# Patient Record
Sex: Male | Born: 1935 | Race: White | Hispanic: No | Marital: Married | Smoking: Former smoker
Health system: Southern US, Community
[De-identification: ages and names within clinical notes are randomized; demographics above are authoritative.]

## PROBLEM LIST (undated history)

## (undated) DIAGNOSIS — M549 Dorsalgia, unspecified: Secondary | ICD-10-CM

## (undated) DIAGNOSIS — E119 Type 2 diabetes mellitus without complications: Secondary | ICD-10-CM

## (undated) DIAGNOSIS — N184 Chronic kidney disease, stage 4 (severe): Secondary | ICD-10-CM

## (undated) DIAGNOSIS — E785 Hyperlipidemia, unspecified: Secondary | ICD-10-CM

## (undated) DIAGNOSIS — K219 Gastro-esophageal reflux disease without esophagitis: Secondary | ICD-10-CM

## (undated) DIAGNOSIS — Z794 Long term (current) use of insulin: Secondary | ICD-10-CM

## (undated) DIAGNOSIS — I2510 Atherosclerotic heart disease of native coronary artery without angina pectoris: Secondary | ICD-10-CM

## (undated) DIAGNOSIS — Z87891 Personal history of nicotine dependence: Secondary | ICD-10-CM

## (undated) DIAGNOSIS — H9190 Unspecified hearing loss, unspecified ear: Secondary | ICD-10-CM

## (undated) DIAGNOSIS — I77810 Thoracic aortic ectasia: Secondary | ICD-10-CM

## (undated) DIAGNOSIS — I10 Essential (primary) hypertension: Secondary | ICD-10-CM

## (undated) HISTORY — DX: Thoracic aortic ectasia: I77.810

## (undated) HISTORY — DX: Unspecified hearing loss, unspecified ear: H91.90

## (undated) HISTORY — DX: Morbid (severe) obesity due to excess calories: E66.01

## (undated) HISTORY — PX: COLONOSCOPY W/ POLYPECTOMY: SHX1380

## (undated) HISTORY — DX: Dorsalgia, unspecified: M54.9

## (undated) HISTORY — PX: ANGIOPLASTY: SHX39

## (undated) HISTORY — DX: Gastro-esophageal reflux disease without esophagitis: K21.9

## (undated) HISTORY — PX: LUMBAR DISC SURGERY: SHX700

## (undated) HISTORY — DX: Personal history of nicotine dependence: Z87.891

## (undated) HISTORY — DX: Hyperlipidemia, unspecified: E78.5

## (undated) HISTORY — PX: SKIN CANCER EXCISION: SHX779

## (undated) HISTORY — PX: CARDIAC SURGERY: SHX584

## (undated) HISTORY — DX: Type 2 diabetes mellitus without complications: E11.9

## (undated) HISTORY — DX: Long term (current) use of insulin: Z79.4

## (undated) HISTORY — PX: APPENDECTOMY: SHX54

## (undated) HISTORY — DX: Essential (primary) hypertension: I10

---

## 1997-10-08 ENCOUNTER — Inpatient Hospital Stay (HOSPITAL_COMMUNITY): Admission: EM | Admit: 1997-10-08 | Discharge: 1997-10-10 | Payer: Self-pay | Admitting: Emergency Medicine

## 1998-04-24 ENCOUNTER — Emergency Department (HOSPITAL_COMMUNITY): Admission: EM | Admit: 1998-04-24 | Discharge: 1998-04-24 | Payer: Self-pay | Admitting: Emergency Medicine

## 1998-09-22 ENCOUNTER — Emergency Department (HOSPITAL_COMMUNITY): Admission: EM | Admit: 1998-09-22 | Discharge: 1998-09-22 | Payer: Self-pay | Admitting: Emergency Medicine

## 1998-10-24 ENCOUNTER — Encounter: Admission: RE | Admit: 1998-10-24 | Discharge: 1998-12-23 | Payer: Self-pay | Admitting: *Deleted

## 1998-11-06 ENCOUNTER — Encounter: Admission: RE | Admit: 1998-11-06 | Discharge: 1998-12-23 | Payer: Self-pay | Admitting: Internal Medicine

## 1998-12-08 ENCOUNTER — Emergency Department (HOSPITAL_COMMUNITY): Admission: EM | Admit: 1998-12-08 | Discharge: 1998-12-08 | Payer: Self-pay | Admitting: Emergency Medicine

## 1998-12-08 ENCOUNTER — Encounter: Payer: Self-pay | Admitting: Emergency Medicine

## 1998-12-24 ENCOUNTER — Encounter: Admission: RE | Admit: 1998-12-24 | Discharge: 1999-03-24 | Payer: Self-pay | Admitting: Internal Medicine

## 1999-01-24 ENCOUNTER — Encounter: Payer: Self-pay | Admitting: Cardiology

## 1999-01-24 ENCOUNTER — Ambulatory Visit (HOSPITAL_COMMUNITY): Admission: RE | Admit: 1999-01-24 | Discharge: 1999-01-24 | Payer: Self-pay | Admitting: Cardiology

## 1999-03-25 ENCOUNTER — Encounter: Admission: RE | Admit: 1999-03-25 | Discharge: 1999-06-23 | Payer: Self-pay | Admitting: Internal Medicine

## 2000-10-11 ENCOUNTER — Inpatient Hospital Stay (HOSPITAL_COMMUNITY): Admission: EM | Admit: 2000-10-11 | Discharge: 2000-10-12 | Payer: Self-pay | Admitting: Emergency Medicine

## 2000-10-11 ENCOUNTER — Encounter: Payer: Self-pay | Admitting: Cardiology

## 2001-05-25 DIAGNOSIS — I2510 Atherosclerotic heart disease of native coronary artery without angina pectoris: Secondary | ICD-10-CM

## 2001-05-25 HISTORY — DX: Atherosclerotic heart disease of native coronary artery without angina pectoris: I25.10

## 2001-06-24 ENCOUNTER — Encounter (INDEPENDENT_AMBULATORY_CARE_PROVIDER_SITE_OTHER): Payer: Self-pay | Admitting: Specialist

## 2001-06-24 ENCOUNTER — Ambulatory Visit (HOSPITAL_COMMUNITY): Admission: RE | Admit: 2001-06-24 | Discharge: 2001-06-24 | Payer: Self-pay | Admitting: Gastroenterology

## 2001-08-18 ENCOUNTER — Encounter: Admission: RE | Admit: 2001-08-18 | Discharge: 2001-08-18 | Payer: Self-pay | Admitting: Orthopedic Surgery

## 2001-08-18 ENCOUNTER — Encounter: Payer: Self-pay | Admitting: Orthopedic Surgery

## 2001-10-07 ENCOUNTER — Encounter: Payer: Self-pay | Admitting: Orthopedic Surgery

## 2001-10-07 ENCOUNTER — Ambulatory Visit (HOSPITAL_COMMUNITY): Admission: RE | Admit: 2001-10-07 | Discharge: 2001-10-07 | Payer: Self-pay | Admitting: Orthopedic Surgery

## 2001-10-18 ENCOUNTER — Encounter: Payer: Self-pay | Admitting: Cardiology

## 2001-10-18 ENCOUNTER — Inpatient Hospital Stay (HOSPITAL_COMMUNITY): Admission: RE | Admit: 2001-10-18 | Discharge: 2001-10-20 | Payer: Self-pay | Admitting: Cardiology

## 2001-10-23 HISTORY — PX: CORONARY ARTERY BYPASS GRAFT: SHX141

## 2001-10-28 ENCOUNTER — Encounter: Payer: Self-pay | Admitting: Surgery

## 2001-10-28 ENCOUNTER — Inpatient Hospital Stay (HOSPITAL_COMMUNITY): Admission: RE | Admit: 2001-10-28 | Discharge: 2001-11-02 | Payer: Self-pay | Admitting: Surgery

## 2001-10-29 ENCOUNTER — Encounter: Payer: Self-pay | Admitting: Surgery

## 2001-10-30 ENCOUNTER — Encounter: Payer: Self-pay | Admitting: Cardiothoracic Surgery

## 2001-10-31 ENCOUNTER — Encounter: Payer: Self-pay | Admitting: Surgery

## 2002-04-27 ENCOUNTER — Inpatient Hospital Stay (HOSPITAL_COMMUNITY): Admission: RE | Admit: 2002-04-27 | Discharge: 2002-04-30 | Payer: Self-pay | Admitting: Orthopedic Surgery

## 2002-04-27 ENCOUNTER — Encounter: Payer: Self-pay | Admitting: Orthopedic Surgery

## 2002-10-10 ENCOUNTER — Encounter: Payer: Self-pay | Admitting: Emergency Medicine

## 2002-10-10 ENCOUNTER — Emergency Department (HOSPITAL_COMMUNITY): Admission: EM | Admit: 2002-10-10 | Discharge: 2002-10-10 | Payer: Self-pay | Admitting: Emergency Medicine

## 2002-11-08 ENCOUNTER — Encounter: Payer: Self-pay | Admitting: Family Medicine

## 2002-11-08 ENCOUNTER — Ambulatory Visit (HOSPITAL_COMMUNITY): Admission: RE | Admit: 2002-11-08 | Discharge: 2002-11-08 | Payer: Self-pay | Admitting: Allergy

## 2003-10-15 ENCOUNTER — Encounter: Admission: RE | Admit: 2003-10-15 | Discharge: 2003-10-15 | Payer: Self-pay | Admitting: Orthopedic Surgery

## 2003-11-25 ENCOUNTER — Emergency Department (HOSPITAL_COMMUNITY): Admission: EM | Admit: 2003-11-25 | Discharge: 2003-11-25 | Payer: Self-pay | Admitting: Emergency Medicine

## 2004-03-02 ENCOUNTER — Emergency Department (HOSPITAL_COMMUNITY): Admission: EM | Admit: 2004-03-02 | Discharge: 2004-03-02 | Payer: Self-pay | Admitting: Emergency Medicine

## 2004-04-16 ENCOUNTER — Observation Stay (HOSPITAL_COMMUNITY): Admission: RE | Admit: 2004-04-16 | Discharge: 2004-04-18 | Payer: Self-pay | Admitting: Orthopedic Surgery

## 2004-08-12 ENCOUNTER — Ambulatory Visit (HOSPITAL_COMMUNITY): Admission: RE | Admit: 2004-08-12 | Discharge: 2004-08-12 | Payer: Self-pay | Admitting: Orthopedic Surgery

## 2004-10-09 ENCOUNTER — Ambulatory Visit: Payer: Self-pay | Admitting: Cardiology

## 2005-07-01 ENCOUNTER — Ambulatory Visit: Payer: Self-pay | Admitting: Cardiology

## 2006-01-27 ENCOUNTER — Ambulatory Visit: Payer: Self-pay | Admitting: Cardiology

## 2007-02-03 ENCOUNTER — Ambulatory Visit: Payer: Self-pay | Admitting: Cardiology

## 2007-02-15 ENCOUNTER — Ambulatory Visit: Payer: Self-pay

## 2007-03-07 ENCOUNTER — Encounter: Admission: RE | Admit: 2007-03-07 | Discharge: 2007-03-07 | Payer: Self-pay | Admitting: Orthopedic Surgery

## 2007-08-18 ENCOUNTER — Encounter: Admission: RE | Admit: 2007-08-18 | Discharge: 2007-08-18 | Payer: Self-pay | Admitting: Family Medicine

## 2008-03-14 ENCOUNTER — Ambulatory Visit: Payer: Self-pay | Admitting: Cardiology

## 2008-07-27 ENCOUNTER — Inpatient Hospital Stay (HOSPITAL_COMMUNITY): Admission: EM | Admit: 2008-07-27 | Discharge: 2008-07-29 | Payer: Self-pay | Admitting: Emergency Medicine

## 2008-07-27 ENCOUNTER — Ambulatory Visit: Payer: Self-pay | Admitting: Family Medicine

## 2008-10-08 DIAGNOSIS — I10 Essential (primary) hypertension: Secondary | ICD-10-CM | POA: Insufficient documentation

## 2008-10-08 DIAGNOSIS — H9190 Unspecified hearing loss, unspecified ear: Secondary | ICD-10-CM | POA: Insufficient documentation

## 2008-10-08 DIAGNOSIS — E785 Hyperlipidemia, unspecified: Secondary | ICD-10-CM

## 2008-10-08 DIAGNOSIS — E119 Type 2 diabetes mellitus without complications: Secondary | ICD-10-CM

## 2008-10-08 DIAGNOSIS — K219 Gastro-esophageal reflux disease without esophagitis: Secondary | ICD-10-CM | POA: Insufficient documentation

## 2008-10-08 DIAGNOSIS — M549 Dorsalgia, unspecified: Secondary | ICD-10-CM | POA: Insufficient documentation

## 2008-10-08 DIAGNOSIS — I2510 Atherosclerotic heart disease of native coronary artery without angina pectoris: Secondary | ICD-10-CM | POA: Insufficient documentation

## 2008-10-10 ENCOUNTER — Ambulatory Visit: Payer: Self-pay | Admitting: Cardiology

## 2009-02-05 ENCOUNTER — Encounter: Admission: RE | Admit: 2009-02-05 | Discharge: 2009-02-05 | Payer: Self-pay | Admitting: Family Medicine

## 2009-04-03 ENCOUNTER — Telehealth: Payer: Self-pay | Admitting: Cardiology

## 2009-10-23 ENCOUNTER — Ambulatory Visit: Payer: Self-pay | Admitting: Cardiology

## 2010-01-10 ENCOUNTER — Encounter: Admission: RE | Admit: 2010-01-10 | Discharge: 2010-01-10 | Payer: Self-pay | Admitting: Internal Medicine

## 2010-02-25 ENCOUNTER — Ambulatory Visit (HOSPITAL_COMMUNITY): Admission: RE | Admit: 2010-02-25 | Discharge: 2010-02-25 | Payer: Self-pay | Admitting: Orthopaedic Surgery

## 2010-03-04 ENCOUNTER — Ambulatory Visit (HOSPITAL_COMMUNITY): Admission: RE | Admit: 2010-03-04 | Discharge: 2010-03-04 | Payer: Self-pay | Admitting: Orthopaedic Surgery

## 2010-03-07 ENCOUNTER — Encounter
Admission: RE | Admit: 2010-03-07 | Discharge: 2010-05-22 | Payer: Self-pay | Source: Home / Self Care | Attending: Orthopaedic Surgery | Admitting: Orthopaedic Surgery

## 2010-06-15 ENCOUNTER — Encounter: Payer: Self-pay | Admitting: Physical Medicine and Rehabilitation

## 2010-06-26 NOTE — Assessment & Plan Note (Signed)
Summary: [facility]   Visit Type:  Follow-up Primary Provider:  Dr. Rosezetta Schlatter  CC:  CAD.  History of Present Illness: The patient returns for yearly followup. Since I last saw him he has had no apparent cardiac problems though he and his wife report that he has been hospitalized 3 times her dehydration, renal insufficiency and nephrolithiasis. I have none of these records. He does not report any cardiac workup since I last saw him. He does get dyspneic with moderate exertion but this seems to be baseline. Is not describing PND or orthopnea. He is not describing chest pressure, neck or arm discomfort. He has no palpitations, presyncope or syncope. He has a low functional level because of joint pains.  Current Medications (verified): 1)  Actos 45 Mg Tabs (Pioglitazone Hcl) .Marland Kitchen.. 1 Tab By Mouth Once Daily 2)  Aspirin 81 Mg  Tabs (Aspirin) .Marland Kitchen.. 1 By Mouth Daily 3)  Alprazolam 0.5 Mg Tabs (Alprazolam) .Marland Kitchen.. 1 By Mouth As Needed 4)  Tamsulosin Hcl 0.4 Mg Caps (Tamsulosin Hcl) .Marland Kitchen.. 1 By Mouth Daily 5)  Lipitor 40 Mg Tabs (Atorvastatin Calcium) .Marland Kitchen.. 1 By Mouth Daily 6)  Hydrocodone-Acetaminophen 7.5-650 Mg Tabs (Hydrocodone-Acetaminophen) .... As Needed 7)  Lisinopril 10 Mg Tabs (Lisinopril) .Marland Kitchen.. 1 By Mouth Daily 8)  Omeprazole 20 Mg Cpdr (Omeprazole) .Marland Kitchen.. 1 By Mouth Dliay  Allergies (verified): No Known Drug Allergies  Past History:  Past Medical History: Reviewed history from 10/08/2008 and no changes required.  1. Hypertension.   2. Hyperlipidemia.   3. Diabetes type 2, insulin dependent.   4. Coronary artery disease, status post CABG.   5. Morbid obesity.   6. GERD.   7. Remote tobacco history.   8. Back pain.   9. Hard of hearing.   Past Surgical History:  1. Appendectomy.   2. Lumbar disk surgery x2.   3. CABG x5 vessels. (LAD diffuse luminal irregularities with focal proximal 70% stenosis, long mid 60% stenosis. The circumflex had a large intermediate proximal and mid  50% stenosis. The right coronary artery had a long 70% stenosis before in RV branch and 99% stenosis before on acute marginal with TIMI I flow.  The EF was 65%.  He had CABG by Dr. Laneta Simmers on October 28, 2001 with a LIMA to the LAD, SVG to diagonal branch, SVG to sequential obtuse marginal and distal circumflex and SVG to the right coronary artery.)  4. Angioplasty   5. Colon polypectomy.   Review of Systems       As stated in the HPI and negative for all other systems.   Vital Signs:  Patient profile:   77 year old male Height:      72 inches Weight:      260 pounds BMI:     35.39 Pulse rate:   66 / minute Resp:     16 per minute BP sitting:   142 / 80  (right arm)  Vitals Entered By: Marrion Coy, CNA (October 23, 2009 9:29 AM)  Physical Exam  General:  Well developed, well nourished, in no acute distress. Head:  normocephalic and atraumatic Mouth:  Poor dentition.  Oral mucosa normal. Neck:  Neck supple, no JVD. No masses, thyromegaly or abnormal cervical nodes. Chest Wall:  Well-healed sternotomy scar Lungs:  Clear bilaterally to auscultation and percussion. Abdomen:  Bowel sounds positive; abdomen soft and non-tender without masses, organomegaly, or hernias noted. No hepatosplenomegaly, obese Msk:  Back normal, normal gait. Muscle strength and tone normal. Extremities:  mild bilateral lower extremity edema Neurologic:  Alert and oriented x 3. Skin:  Intact without lesions or rashes. Psych:  Normal affect.   Detailed Cardiovascular Exam  Neck    Carotids: Carotids full and equal bilaterally without bruits.      Neck Veins: Normal, no JVD.    Heart    Inspection: no deformities or lifts noted.      Palpation: normal PMI with no thrills palpable.      Auscultation: regular rate and rhythm, S1, S2 without murmurs, rubs, gallops, or clicks.    Vascular    Abdominal Aorta: no palpable masses, pulsations, or audible bruits.      Femoral Pulses: normal femoral pulses  bilaterally.      Pedal Pulses: normal pedal pulses bilaterally.      Radial Pulses: normal radial pulses bilaterally.      Peripheral Circulation: no clubbing, cyanosis, or edema noted with normal capillary refill.     EKG  Procedure date:  10/23/2009  Findings:      Sinus rhythm, rate 66, axis within normal limits, intervals within normal limits, no acute ST-T wave changes  Impression & Recommendations:  Problem # 1:  CAD (ICD-414.00) This point he has had no acute cardiac complaints. No further testing is indicated. We will continue with risk reduction. Orders: EKG w/ Interpretation (93000)  Problem # 2:  HYPERLIPIDEMIA (ICD-272.4) I will check with his primary care office andHospital records to see if he's had a recent fasting lipid profile. If not I were one. The goal should be an LDL less than 70 and HDL greater than 40.  Problem # 3:  HYPERTENSION (ICD-401.9) His blood pressure is slightly elevated. At this point this can be followed with medications as necessary. Apparently medications have been changed over the past year with his acute illnesses and I will need to review these records in order to understand how to treat him going forward. He will sign a release of information. Orders: EKG w/ Interpretation (93000)  Patient Instructions: 1)  Your physician recommends that you schedule a follow-up appointment in: 1 year 2)  Your physician recommends that you have a FASTING lipid profile:  3)  Your physician recommends that you continue on your current medications as directed. Please refer to the Current Medication list given to you today.

## 2010-08-06 LAB — COMPREHENSIVE METABOLIC PANEL
ALT: 14 U/L (ref 0–53)
Alkaline Phosphatase: 66 U/L (ref 39–117)
CO2: 24 mEq/L (ref 19–32)
GFR calc non Af Amer: 38 mL/min — ABNORMAL LOW (ref >60)
Glucose, Bld: 108 mg/dL — ABNORMAL HIGH (ref 70–99)
Potassium: 4.3 mEq/L (ref 3.5–5.1)
Sodium: 140 mEq/L (ref 135–145)

## 2010-08-06 LAB — URINALYSIS, ROUTINE W REFLEX MICROSCOPIC
Bilirubin Urine: NEGATIVE
Hgb urine dipstick: NEGATIVE
Ketones, ur: NEGATIVE mg/dL
Nitrite: NEGATIVE
pH: 5.5 (ref 5.0–8.0)

## 2010-08-06 LAB — DIFFERENTIAL
Basophils Relative: 1 % (ref 0–1)
Eosinophils Absolute: 0.1 10*3/uL (ref 0.0–0.7)
Monocytes Relative: 11 % (ref 3–12)
Neutrophils Relative %: 53 % (ref 43–77)

## 2010-08-06 LAB — CBC
HCT: 31.7 % — ABNORMAL LOW (ref 39.0–52.0)
Hemoglobin: 10.7 g/dL — ABNORMAL LOW (ref 13.0–17.0)
WBC: 6.4 10*3/uL (ref 4.0–10.5)

## 2010-08-06 LAB — GLUCOSE, CAPILLARY: Glucose-Capillary: 135 mg/dL — ABNORMAL HIGH (ref 70–99)

## 2010-08-06 LAB — URINE MICROSCOPIC-ADD ON

## 2010-08-06 LAB — SURGICAL PCR SCREEN: MRSA, PCR: NEGATIVE

## 2010-09-04 LAB — POCT CARDIAC MARKERS
CKMB, poc: 1.3 ng/mL (ref 1.0–8.0)
Myoglobin, poc: 239 ng/mL (ref 12–200)

## 2010-09-04 LAB — URINALYSIS, ROUTINE W REFLEX MICROSCOPIC
Protein, ur: NEGATIVE mg/dL
Specific Gravity, Urine: 1.024 (ref 1.005–1.030)
Urobilinogen, UA: 1 mg/dL (ref 0.0–1.0)

## 2010-09-04 LAB — BASIC METABOLIC PANEL
CO2: 23 mEq/L (ref 19–32)
Calcium: 9.1 mg/dL (ref 8.4–10.5)
Calcium: 9.1 mg/dL (ref 8.4–10.5)
Creatinine, Ser: 2.58 mg/dL — ABNORMAL HIGH (ref 0.4–1.5)
GFR calc Af Amer: >60 mL/min (ref >60)
GFR calc non Af Amer: 52 mL/min — ABNORMAL LOW (ref >60)
Glucose, Bld: 142 mg/dL — ABNORMAL HIGH (ref 70–99)
Potassium: 5.2 mEq/L — ABNORMAL HIGH (ref 3.5–5.1)
Sodium: 141 mEq/L (ref 135–145)
Sodium: 141 mEq/L (ref 135–145)

## 2010-09-04 LAB — CBC
HCT: 34.6 % — ABNORMAL LOW (ref 39.0–52.0)
Hemoglobin: 13.3 g/dL (ref 13.0–17.0)
MCHC: 33.6 g/dL (ref 30.0–36.0)
MCHC: 34.2 g/dL (ref 30.0–36.0)
MCV: 90.4 fL (ref 78.0–100.0)
MCV: 90.9 fL (ref 78.0–100.0)
MCV: 91 fL (ref 78.0–100.0)
Platelets: 153 10*3/uL (ref 150–400)
RBC: 3.81 MIL/uL — ABNORMAL LOW (ref 4.22–5.81)
RBC: 3.97 MIL/uL — ABNORMAL LOW (ref 4.22–5.81)
RBC: 4.3 MIL/uL (ref 4.22–5.81)
RDW: 13.9 % (ref 11.5–15.5)
WBC: 5.9 10*3/uL (ref 4.0–10.5)
WBC: 6.7 10*3/uL (ref 4.0–10.5)

## 2010-09-04 LAB — DIFFERENTIAL
Basophils Relative: 1 % (ref 0–1)
Eosinophils Absolute: 0.3 10*3/uL (ref 0.0–0.7)
Lymphs Abs: 2 10*3/uL (ref 0.7–4.0)
Monocytes Absolute: 0.8 10*3/uL (ref 0.1–1.0)
Monocytes Relative: 12 % (ref 3–12)
Neutro Abs: 3.6 10*3/uL (ref 1.7–7.7)
Neutrophils Relative %: 54 % (ref 43–77)

## 2010-09-04 LAB — CREATININE, URINE, RANDOM: Creatinine, Urine: 328.7 mg/dL

## 2010-09-04 LAB — RENAL FUNCTION PANEL
BUN: 21 mg/dL (ref 6–23)
CO2: 21 mEq/L (ref 19–32)
CO2: 23 mEq/L (ref 19–32)
CO2: 24 mEq/L (ref 19–32)
Calcium: 8.9 mg/dL (ref 8.4–10.5)
Chloride: 109 mEq/L (ref 96–112)
Chloride: 115 mEq/L — ABNORMAL HIGH (ref 96–112)
Creatinine, Ser: 1.36 mg/dL (ref 0.4–1.5)
Creatinine, Ser: 1.67 mg/dL — ABNORMAL HIGH (ref 0.4–1.5)
GFR calc Af Amer: 35 mL/min — ABNORMAL LOW (ref >60)
GFR calc Af Amer: 49 mL/min — ABNORMAL LOW (ref >60)
GFR calc non Af Amer: 29 mL/min — ABNORMAL LOW (ref >60)
GFR calc non Af Amer: 41 mL/min — ABNORMAL LOW (ref >60)
Glucose, Bld: 122 mg/dL — ABNORMAL HIGH (ref 70–99)
Phosphorus: 3.2 mg/dL (ref 2.3–4.6)
Potassium: 5.7 mEq/L — ABNORMAL HIGH (ref 3.5–5.1)
Sodium: 138 mEq/L (ref 135–145)

## 2010-09-04 LAB — GLUCOSE, CAPILLARY
Glucose-Capillary: 149 mg/dL — ABNORMAL HIGH (ref 70–99)
Glucose-Capillary: 153 mg/dL — ABNORMAL HIGH (ref 70–99)
Glucose-Capillary: 99 mg/dL (ref 70–99)

## 2010-09-04 LAB — UREA NITROGEN, URINE: Urea Nitrogen, Ur: 526 mg/dL

## 2010-09-04 LAB — URIC ACID: Uric Acid, Serum: 9.7 mg/dL — ABNORMAL HIGH (ref 4.0–7.8)

## 2010-10-07 NOTE — Assessment & Plan Note (Signed)
[facility]                            CARDIOLOGY OFFICE NOTE   ZYMARION, [name]                      MRN:          [id number]  DATE:10/10/2008                            DOB:          06/15/1933    PRIMARY CARE PHYSICIAN:  Marjory Lies, MD   REASON FOR PRESENTATION:  Evaluate the patient with chest pain and  coronary artery disease.   HISTORY OF PRESENT ILLNESS:  The patient is 77 years old.  He returns  for 77-month followup.  Since I last saw him, he was hospitalized with  apparent dehydration and acute renal insufficiency.  He is now back on  his previous medications and having his medications and kidney function  followed by Dr. Doristine Counter.  The patient reports that he has no new  cardiovascular symptoms.  He is not particularly active though he was  able to plant a garden yesterday with his wife.  Apparently, he would  poke a hole in the ground and they would insert the plant.  He did not  have to do any howling.  He does little activities like this around his  house.  He is limited by some knee pain.  He will get short of breath  with any moderate activity, but he is not describing any progression of  this.  He has had no new PND or orthopnea.  He has had no new chest  discomfort, neck, or arm discomfort.  He has not had any  lightheadedness, brief syncope, or syncope.   PAST MEDICAL HISTORY:  Coronary artery disease (LAD diffuse luminal  irregularities with focal proximal 70% stenosis, long mid 60% stenosis.  The circumflex had a large intermediate proximal and mid 50% stenosis.  The right coronary artery had a long 70% stenosis before in RV branch  and 99% stenosis before on acute marginal with TIMI I flow.  The EF was  65%.  He had CABG by Dr. Laneta Simmers on October 28, 2001 with a LIMA to the LAD,  SVG to diagonal branch, SVG to sequential obtuse marginal and distal  circumflex and SVG to the right coronary artery.  His last stress  perfusion  study demonstrated no ischemia in 2008), dyslipidemia,  hypertension, morbid obesity, diabetes mellitus, appendectomy, lumbar  disk surgery, chronic renal insufficiency.   ALLERGIES:  None.   MEDICATIONS:  1. Actos 45 mg daily.  2. Celebrex 200 mg daily.  3. Lisinopril and hydrochlorothiazide.  4. Omeprazole 20 mg daily.  5. Alprazolam 0.5 mg b.i.d.  6. Metformin 500 mg b.i.d.  7. Levemir.  8. Flomax 0.4 mg daily.  9. Lipitor 40 mg daily.  10.Aspirin 81 mg daily.   REVIEW OF SYSTEMS:  As stated in the HPI and otherwise negative for  other systems.   PHYSICAL EXAMINATION:  GENERAL:  The patient is pleasant and in no  distress.  VITAL SIGNS:  Blood pressure 148/78, heart rate 80 and regular, weight  282 pounds, body mass index 38.  NECK:  No jugular venous distension at 45 degrees, carotid upstroke  brisk and symmetrical.  No bruit.  No thyromegaly.  LYMPHATICS:  No adenopathy.  LUNGS:  Clear to auscultation bilaterally.  BACK:  No costovertebral angle tenderness.  CHEST:  Unremarkable.  HEART:  PMI not displaced or sustained.  S1 and S2 within normal limits.  No S3.  No S4.  No clicks.  No rubs.  No murmurs.  ABDOMEN:  Obese, positive bowel sounds, normal in frequency and pitch.  No bruits.  No rebound.  No guarding.  No midline pulsatile mass.  No  hepatomegaly.  No splenomegaly.  SKIN:  No rashes.  No nodules.  EXTREMITIES:  2+ pulses.  No edema.   EKG sinus rhythm, rate 80, axis within normal limits, intervals within  normal limits, no acute ST-T wave changes.   ASSESSMENT AND PLAN:  1. Coronary artery disease.  The patient has no new symptoms.  He is      not participating in secondary risk reduction.  I have had long      discussions with him and his wife about this.  At this point, no      further cardiovascular testing is suggested.  2. Dyslipidemia per Dr. Caryl Never.  The goal should be an LDL less      than 70 and HDL greater than 40.  3. Hypertension.  Blood  pressure is slightly elevated today.  However,      it has not been at previous visits.  It was not during the      hospitalization.  Therefore, he will continue the meds as listed      and can have these up titrated based on future readings.      Certainly, he needs to loose weight as his treatment strategy for      his blood pressure as well as his diabetes.  4. Obesity.  We had this discussion again today about weight loss.  5. Followup.  I will see him back in about 12 months or sooner if he      has any problems.     Rollene Rotunda, MD, [facility]  Electronically Signed    JH/MedQ  DD: 10/10/2008  DT: 10/11/2008  Job #: [id number]   cc:   Marjory Lies, M.D.

## 2010-10-07 NOTE — Discharge Summary (Signed)
[name], [name] NO.:  [id number]   MEDICAL RECORD NO.:  [id number]          PATIENT TYPE:  INP   LOCATION:  6705                         FACILITY:  [facility]   PHYSICIAN:  Paula Compton, MD        DATE OF BIRTH:  06/04/1933   DATE OF ADMISSION:  07/27/2008  DATE OF DISCHARGE:  07/29/2008                               DISCHARGE SUMMARY   PRIMARY CARE PHYSICIAN:  Kristian Covey, MD, [facility].   ISSUES DURING THIS HOSPITALIZATION:  1. Acute renal failure, which is resolved.  2. Hypertension.  3. Hyperlipidemia.  4. Diabetes type 2.  5. Coronary artery disease.  6. Gastroesophageal reflux disease.  7. Chronic pain.  8. Benign prostatic hypertrophy.   MEDICATIONS:  The patient is being sent home on:  1. Actos 45 mg p.o. daily.  2. Alprazolam 0.5 mg twice daily as needed for anxiety.  3. Flomax 0.4 mg p.o. daily.  4. Lipitor 40 mg p.o. daily.  5. Omeprazole 20 mg p.o. daily.  6. Tramadol 50 mg p.o. 1-2 tablets every 8 hours.   The patient is going to stop taking Celebrex, lisinopril, and  hydrochlorothiazide combo pill and metformin due to his high potassium  and creatinine during this hospitalization.  These medications may be  inciting the problem and will be held until he is able to follow up with  his primary care physician.   IMAGES AND STUDIES DURING THIS HOSPITALIZATION:  On July 27, 2008, the  patient had a two-view chest x-ray that showed no acute cardiopulmonary  abnormality.  The patient also had a renal ultrasound that was negative  for renal obstruction but did show a distended gallbladder with  gallbladder sludge and stones.   PERTINENT LABORATORY DATA DURING THIS HOSPITALIZATION:  The patient came  on July 27, 2008 and had a CBC showing white blood cell count of 6.7,  hemoglobin 13.3, hematocrit 38.9, and platelet count of 197.  The  patient had point-of-care cardiac enzymes that showed a normal CK-MB  and  troponins that were negative at 0.05 with a myoglobin that was elevated  at 238.  The basic metabolic panel showed that the patient had a sodium  of 140, a potassium of 6.2, chloride of 108, bicarb 23, BUN 46 with a  creatinine of 2.58, glucose of 131 with a calcium of 9.4.  He had a beta-  natriuretic peptide that was less than 30 and he also had a urinalysis  that was only significant for small bilirubin but with negative nitrites  and negative leukocytes.  The patient had a renal function panel done as  well that showed a sodium of 138, potassium 5.7, creatinine of 2.27,  albumin of 3.2, calcium of 8.4, and phosphorus of 4.4.  He also had a  serum uric acid that was 9.7, which is elevated.  He also had a random  urine creatinine that showed a level of 328.7.  He had a hemoglobin A1c  of 6.9.  He also had a urine nitrogen urea test showing 526.  On July 28, 2008, the patient had another CBC that was essentially normal except  for mild anemia with a hemoglobin of 12.1 and additional renal panel  that showed that his potassium was trending down as well as his  creatinine.  His potassium was 5.8 with creatinine of 1.67.  On the day  of discharge, the patient was found to have a hemoglobin of 11.8,  hematocrit of 34.6, platelets of 156, and a renal panel that showed that  he had a potassium of 4.8 with a creatinine of 1.36, both back within  the normal range.   BRIEF HOSPITALIZATION SUMMARY:  1. This is a 77 year old male that came in with right-sided pain that      had been diagnosed as pneumonia and treated with antibiotics until      about 1 week prior to presenting to his PCP with generalized      fatigue and cold signs and symptoms for 2 days.  He had some blood      work showing that he was in echo acute renal failure.  He was sent      to the ER where multiple home medications were held including      Celexa, metformin, and lisinopril and hydrochlorothiazide combo       medication.  The patient was started with hydration of normal      saline 125 mL/hour and offending meds were discontinued.  He was      monitored closely and also had multiple studies done including      acute renal function panels and random urine creatinine, urine urea      nitrogen, and uric acid studies as well as ultrasound of the renal      system.  All studies pointed to a diagnosis of prerenal acute renal      failure.  There did not appear to any weeny, chronic, or acute      obstruction in the urinary tract.  The patient was likely thrown      into acute renal failure due to his medications and mild      dehydration.  The patient will be discharged today with      instructions for a close followup with his PCP at the beginning of      next week to reassess his renal function and to reassess his      medications as well.  2. Hypertension.  The patient was continued on hydrochlorothiazide      during this hospitalization.  I will restart this medication on his      discharge.  The patient has been stable with only mildly elevated      blood pressures during this hospitalization.  The highest it went      was 157/95, likely the patient will need to either increase his      medication or start a second one that will not effect his kidneys.      He did have high potassium likely due to some of these medications.  3. Hyperlipidemia.  The patient was continued on statin during this      hospitalization.  He will be continued at home on a statin.  4. Diabetes.  The patient was continued on a sliding scale and Lantus      of 80 units during this hospitalization.  He will be sent home on      Lantus 80 mg with instructions for his PCP to titrate up to his  home dose as needed because A1c was good at 6.9.  5. Coronary artery disease.  The patient was started on aspirin 81 mg      daily.  He will need to go home on aspirin 81 mg.  6. GERD.  The patient does have a history of GERD and  continued on      Protonix while in the hospital.  He was also sent home with his      home dose of omeprazole.  7. Chronic pain.  The patient has chronic pain issues for which he      takes Tylenol and Ultram.  The patient will be sent home with his      home dose of tramadol 50 mg p.o. 1-2 tablets every 8 hours as      needed for pain.  8. Benign prostatic hypertrophy.  The patient was continued on his      home dose of Flomax and will be sent home with this medication as      well.  Issues to be followed up after the patient leaves the      hospital, he will likely need another basic metabolic panel to      assess his renal function, specifically his potassium and      creatinine.  This he will need to be done at his primary care      physician's as well as deciding about the medications that he was      on.  It seems that the patient was on clarithromycin as a      antibiotic.  This may have been the inciting medication, but the      Celebrex, lisinopril, and metformin may have also been contributing      to his acute renal failure and his PCP will need to titrate this      medications back or change them to other medications.   DISCHARGE INSTRUCTIONS:  The patient was sent home in stable medical  condition with instructions to follow up with his PCP early the next  week as his PCP's office is closed in weekends.  The patient was  discharged.      Jamie Brookes, MD  Electronically Signed      Paula Compton, MD  Electronically Signed    AS/MEDQ  D:  07/29/2008  T:  07/30/2008  Job:  [id number]   cc:   Kristian Covey, M.D.

## 2010-10-07 NOTE — Assessment & Plan Note (Signed)
[facility]                            CARDIOLOGY OFFICE NOTE   ALPHA, MYSLIWIEC                    MRN:          [id number]  DATE:03/14/2008                            DOB:          [birth year]    PRIMARY CARE PHYSICIAN:  Evelena Peat, MD.   REASON FOR PRESENTATION:  Evaluate the patient with coronary artery  disease.   HISTORY OF PRESENT ILLNESS:  The patient is a pleasant 77 year old  gentleman who presents for yearly followup.  He has done quite well  since I last saw him.  He has had no new cardiovascular symptoms.  Unfortunately, he has never embraced secondary risk reduction.  He does  a little tinkering in the yard.  He does not get any chest pressure  with this, neck, or arm discomfort.  He has had no palpitation,  presyncope, or syncope.  He complains of some discomfort under his right  arm sporadically.  This happens at rest.  He cannot bring this on.  It  does not seem to be particularly severe.  It has been a stable pattern.  He does get dyspneic with exertion, but this has been stable pattern as  well.   PAST MEDICAL HISTORY:  Coronary artery disease (LAD diffuse luminal  irregularities with focal proximal at 70% stenosis, long mid 60%  stenosis.  Circumflex  was large and had a proximal and mid 50%  stenosis.  The right coronary artery of long 70% stenosis before an RV  branch and 99% stenosis before an acute marginal with TIMI 1 flow.  The  EF was 65%.  He had a CABG by Dr. Laneta Simmers on October 28, 2001, with a LIMA to  the LAD, SVG to diagonal, SVG to sequential obtuse marginal on distal  left circumflex, and SVG to the distal right coronary artery),  dyslipidemia, hypertension, diabetes mellitus, appendectomy, and lumbar  disk surgery.   ALLERGIES:  None.   MEDICATIONS:  1. Actos 45 mg daily.  2. Celebrex 200 mg daily.  3. Lipitor 10 mg daily.  4. Lisinopril HCT.  5. Omeprazole 20 mg daily.  6. Alprazolam 0.5 mg b.i.d.  7. Metformin 500 mg b.i.d.  8. Aspirin 325 mg daily.  9. Levemir.   REVIEW OF SYSTEMS:  As stated in the HPI and otherwise negative for  other systems.   PHYSICAL EXAMINATION:  GENERAL:  The patient is in no distress.  VITAL SIGNS:  Blood pressure 130/76, heart rate 71 and regular, weight  283 pounds, and body mass index 38.  HEENT:  Eyes are unremarkable, pupils are equal, round, and reactive to  light, fundi not visualized, oral mucosa unremarkable.  NECK:  No  jugular venous distention at 45 degrees, carotid upstroke brisk and  symmetrical, no bruits, no thyromegaly.  LYMPHATICS:  No cervical, axillary, or inguinal adenopathy.  LUNGS:  Clear to auscultation bilaterally.  BACK:  No costovertebral angle tenderness.  CHEST:  Well-healed sternotomy scar.  HEART:  PMI not displaced or sustained, S1 and S2 within normal limits,  no S3, no S4, no clicks, no rubs, no murmurs.  ABDOMEN:  Obese, positive  bowel sounds.  Normal in frequency and pitch, no bruits, no rebound, no  guarding or midline pulsatile mass.  No hepatomegaly or splenomegaly.  SKIN:  No rashes, no nodules.  EXTREMITIES:  Pulses are 2+ throughout, no edema, no cyanosis or  clubbing.  NEURO:  Oriented to person, place, and time, cranial nerves II-XII  grossly intact, motor grossly intact.   EKG; sinus rhythm with premature atrial contractions and bigeminal  pattern, early transition lead V2, no acute ST-wave changes.   ASSESSMENT AND PLAN:  1. Coronary artery disease.  The patient is having no new symptoms.      He had a stress perfusion study in 2008 that demonstrated an      ejection fraction of 57% with no evidence of ischemia or infarct.      At this point, I will try to continue to impress upon him the      importance of secondary risk reduction, though he has never      particularly participated.  2. Dyslipidemia.  He said this was good when checked by his primary      care doctor recently.  He should have a  goal LDL of less than 70      and HDL greater than 40.  I will defer to Dr. Caryl Never.  3. Hypertension.  Blood pressure is well controlled.  He will continue      on the meds as listed.  4. Obesity.  He understands the need to lose weight with diet and      exercise, but he is unable to accomplish this.  5. Followup.  I will see him in about 12 months or sooner if needed.     Rollene Rotunda, MD, [facility]  Electronically Signed    JH/MedQ  DD: 03/14/2008  DT: 03/14/2008  Job #: [id number]   cc:   Evelena Peat, M.D.

## 2010-10-07 NOTE — Assessment & Plan Note (Signed)
[facility]                            CARDIOLOGY OFFICE NOTE   [name], [name]                    MRN:          [id number]  DATE:02/03/2007                            DOB:          1933-12-01    The primary is Paulita Cradle, NP.   REASON FOR PRESENTATION:  Evaluate patient with chest pain and coronary  disease.   HISTORY OF PRESENT ILLNESS:  The patient is a 77 year old gentleman with  coronary disease as described below.  In the last few months he has  started to have increasing chest discomfort.  He does not recall whether  he has ever had this kind of discomfort before.  He does not recall his  chest pain that he had back in the late 1990s and 2003 at the time of  his bypass.  He says that this chest discomfort happens several times a  week.  It is a sharp discomfort.  It shoots from his right to his left  side.  He also describes chest discomfort when he walks up the hill from  feeding his chickens.  He will have some chest discomfort if he has to  do any activity in the yard.  He is relatively sedentary.  The  discomfort does not radiate to his jaw or to his arms.  He does not have  any associated nausea, vomiting or diaphoresis.  He does not notice any  palpitations, presyncope or syncope.  He says this is increasing in  intensity over the last couple of months.  He has not had any resting  symptoms.   PAST MEDICAL HISTORY:  1. Coronary artery disease.  LAD:  Diffuse luminal irregularities with      focal proximal 70% stenosis, a long mid 60% stenosis.  The      circumflex had a large intermediate proximal and mid 50% stenosis.      The right coronary artery had a long 70% stenosis before an RV      branch and 99% stenosis before an acute marginal with TIMI-1 flow.      The EF was 65%.  He had CABG by Dr. Laneta Simmers on October 28, 2001 with a      LIMA to the LAD, SVG to the diagonal branch, SVG to sequential      obtuse marginal and  distal left circumflex, and SVG to the distal      right coronary artery.  2. Dyslipidemia.  3. Hypertension.  4. Diabetes mellitus.  5. Appendectomy.  6. Lumbar disk surgery.   ALLERGIES:  None.   MEDICATIONS:  1. Actos 45 mg daily.  2. Celebrex 200 mg daily.  3. Lipitor 10 mg daily.  4. Lisinopril.  5. Omeprazole 20 mg daily.  6. Alprazolam.  7. Metformin 500 mg b.i.d.  8. Aspirin 325 mg daily.  9. Levemir.   REVIEW OF SYSTEMS:  As stated in the HPI and otherwise negative for  other systems.   PHYSICAL EXAMINATION:  The patient is in no distress.  Blood pressure 118/71, heart rate 80 and regular, weight 287 pounds,  body mass index  38.  HEENT:  Eyelids unremarkable.  Pupils equal, round, and reactive to  light.  Fundi not visualized.  Oral mucosa unremarkable.  NECK:  No jugular venous distention at 45 degrees.  Carotid upstroke  brisk and symmetrical, no bruits.  No thyromegaly.  LYMPHATIC:  No cervical, axillary or inguinal adenopathy.  LUNGS:  Clear to auscultation bilaterally.  BACK:  No costovertebral angle tenderness.  CHEST:  Well-healed sternotomy scar.  HEART:  PMI not displaced or sustained.  S1 and S2 within normal limits,  no S3, no S4, no clicks, no rubs, no murmurs.  ABDOMEN:  Morbidly obese, positive bowel sounds, normal in frequency and  pitch, no bruits, no rebound, no guarding, no midline pulsatile mass, no  hepatomegaly, no splenomegaly.  SKIN:  No rashes, no nodules.  EXTREMITIES:  2+ pulses, moderate bilateral lower extremity edema, no  cyanosis or clubbing.  NEUROLOGIC:  Oriented to person, place and time.  Cranial nerves II-XII  grossly intact.  Motor grossly intact.   EKG:  Sinus rhythm, premature atrial contractions, no acute ST-T wave  changes.   ASSESSMENT AND PLAN:  1. Chest discomfort.  The patient's chest discomfort has some atypical      and some typical features.  He has significant cardiovascular      disease with 33-year-old  bypass grafts.  He has not participated      aggressively in risk reduction though his physicians have tried.      At this point I need to screen him with a stress perfusion study.      Further evaluation will be based on these results.  2. Dyslipidemia per his primary care team.  The goal will be an LDL      less than 70 and an HDL greater than 40.  3. Hypertension.  Blood pressure is well-controlled and he will      continue with the medications as listed.  4. Morbid obesity.  We talked about weight loss with diet and      exercise.  Unfortunately, he continues to gain weight.  5. Follow-up.  I will see the patient back based on the results of the      above or in one year.     Rollene Rotunda, MD, [facility]  Electronically Signed    JH/MedQ  DD: 02/03/2007  DT: 02/04/2007  Job #: [id number]   cc:   Paulita Cradle, NP

## 2010-10-07 NOTE — H&P (Signed)
NAMEGEORGE, [name] NO.:  [id number]   MEDICAL RECORD NO.:  [id number]          PATIENT TYPE:  INP   LOCATION:  6705                         FACILITY:  [facility]   PHYSICIAN:  Paula Compton, MD        DATE OF BIRTH:  [birth year]   DATE OF ADMISSION:  07/27/2008  DATE OF DISCHARGE:                              HISTORY & PHYSICAL   REASON FOR ADMISSION:  Acute renal failure and hyperkalemia.   PRIMARY CARE PHYSICIAN:  Evelena Peat, MD at [facility].   HISTORY OF PRESENT ILLNESS:  This is a 77 year old male with a 74-month  history of right-sided pain diagnosed as a pneumonia and treated with an  antibiotic until approximately 1 week ago who presented to his primary  care physician with generalized fatigue and cold symptoms 2 days ago  where he has had some blood work drawn.  His blood work showed that he  was in acute renal failure and thus he was sent to [facility] for further workup and treatment.  He has noticed during this  time, he has had some decreased urine output and some urinary hesitancy.  He also had a very poor appetite and not been eating well.  He has not  taken his home meds as prescribed, which are noted below.  She denies  any color change to his urine.  Denies any dysuria and denies taking any  over-the-counter NSAIDs.   PAST MEDICAL HISTORY:  1. Hypertension.  2. Hyperlipidemia.  3. Diabetes type 2, insulin dependent.  4. Coronary artery disease, status post CABG.  5. Morbid obesity.  6. GERD.  7. Remote tobacco history.  8. Back pain.  9. Hard of hearing.   PAST SURGICAL HISTORY:  1. Appendectomy.  2. Lumbar disk surgery x2.  3. CABG x5 vessels.  4. Angioplasty  5. Colon polypectomy.   FAMILY HISTORY:  Unknown for his mother.  Father had heart trouble.  No  family members on hemodialysis.  He has 8 children, one died at 6 of  sepsis, one in his 40s had a severe MI, otherwise their  health is  unknown.   SOCIAL HISTORY:  The patient quit tobacco in 2002 after a 60+ pack-year  history.  He denies alcohol or illicit drugs.  He lives with his wife of  52 years.  He enjoys watching his great-grandson.  He is retired from  [employer] works.   CURRENT MEDICATIONS:  1. Lantus 100 units subcu nightly.  2. Lisinopril/hydrochlorothiazide 20/25 p.o. daily.  3. Actos 45 mg p.o. daily.  4. Xanax 0.5 mg p.o. b.i.d. p.r.n. anxiety.  5. Tramadol 50 mg 1-2 q.8 h. p.r.n. pain.  6. Celebrex 200 mg p.o. daily.  7. Lipitor 40 mg p.o. daily.  8. Omeprazole 20 mg p.o. daily.  9. Metformin 1000 mg p.o. b.i.d.  10.Flomax 0.4 mg p.o. daily   ALLERGIES:  No known drug allergies.   REVIEW OF SYSTEMS:  Pertinent for chronic shortness of breath, angina,  some nasal decongestion, and mild abdominal discomfort with  constipation, otherwise is negative on  greater than 12 points.   ADVANCED DIRECTIVES:  The patient states that he does not wish to be  resuscitated or intubated as of this admission.   PHYSICAL EXAMINATION:  VITAL SIGNS:  O2 sat 97% on room air, temperature  97.8, respiratory rate 14-18, pulse 78-95, blood pressure 105-145/62-73.  GENERAL:  This is a morbidly obese white male, in no apparent distress  who is very hard of hearing.  HEENT:  Normocephalic, atraumatic with pupils equal, round, and reactive  to light.  Sclerae are clear.  There is mild clear rhinorrhea.  There is  tacky mucous membranes and poor dentition.  CARDIOVASCULAR:  Regular rate and rhythm.  No murmurs, rubs, or gallops.  PULMONARY:  Clear to auscultation bilaterally with normal work of  breathing.  ABDOMEN:  Soft, obese, and slightly distended, mildly tender diffusely  with positive bowel sounds.  EXTREMITIES:  Trace bilateral lower extremity edema and 2+ peripheral  pulses with poorly kept toenails.  NEUROLOGIC:  The patient is alert and oriented, but very hard of  hearing; otherwise nerves grossly  intact.  SKIN:  Intact.   LABORATORY DATA:  UA reveals specific gravity of 1.024, pH 5, small  bilirubin, otherwise was within normal limits.  BNP is less than 30.  Sodium 140, potassium 6.2, chloride 108, bicarb 23, BUN 46, creatinine  2.58, glucose 131, calcium 9.4.  White blood cell count 6.7, hemoglobin  13.3, hematocrit 38.9, platelet count 176 with a normal differential.  Point-of-care cardiac enzymes revealed CK-MB 1.3, troponin I less than  0.05, myoglobin 239.  Previous labs revealed creatinine of 1.0 in  November 2005 and 1.2 in November 2003.   STUDIES:  Chest x-ray reveals no acute cardiopulmonary abnormality.  An  EKG reveals normal sinus rhythm with nonspecific T-wave flattening that  is unchanged from prior.  There is no peak T-wave on EKG.   ASSESSMENT:  This is a 77 year old male with multiple medical problems  who presents with acute renal failure and hyperkalemia.   PLAN:  1. Acute renal failure.  There are multiple possible etiologies at      this time including dehydration plus/minus meds, plus/minus      obstruction, plus/minus intrinsic disease.  In order to treat the      chronicity of his disease, we will check a renal function panel      approximately secretion of urea because he is on diuretic, urine      microscopic, and a renal ultrasound.  We will start hydration with      normal saline at 125 mL an hour.  We will hold his offending meds      from home including metformin, lisinopril, and Celebrex.  We will      avoid any other nephrotoxins at this time as well.  We will monitor      strict I's and O's, daily weights, and monitor electrolytes      closely.  We will also monitor his creatinine daily for      improvement.  2. Type 2 diabetes.  We will hold his metformin and Actos as noted      above.  He has been taking his home Lantus regularly; therefore we      will start at a decrease dose than home by 20 units.  We will add      an addition to this  resistance sliding scale insulin and watch his      sugars closely.  We will check an  A1c to get some idea of his      control at home.  3. Hypertension.  We will hold his lisinopril as noted above, but we      will continue his hydrochlorothiazide.  We will have hydralazine      p.r.n. for elevated blood pressures and now, we will watch his      blood pressures closely.  4. Hyperlipidemia.  We will continue Lipitor at his home dose.  5. Coronary artery disease.  We will start an aspirin daily.  6. Chronic pain/anxiety.  We will continue his home Ultram and Xanax      and add p.r.n. Tylenol for pain.  7. GERD.  Protonix.  8. BPH.  Flomax.  We will check a renal ultrasound and postvoid      residual.  9. Prophylaxis, subcu heparin t.i.d.  10.Advance directives, DNR/DNI.  11.FEN/GI.  We will recheck his potassium as he has already been given      Kayexalate in emergency department.  We will start a low-sodium      diabetic diet and IV fluids will be as above.  12.Disposition is pending, clinical improvement.      Ancil Boozer, MD  Electronically Signed      Paula Compton, MD  Electronically Signed    SA/MEDQ  D:  07/28/2008  T:  07/29/2008  Job:  [id number]

## 2010-10-10 NOTE — Discharge Summary (Signed)
[place]. [facility]  Patient:    [name], [name] Visit Number: [id number] MRN: [id number]          Service Type: SUR Location: 2000 2008 01 Attending Physician:  Cleatrice Burke Dictated by:   Loura Pardon, P.A. Admit Date:  10/28/2001 Discharge Date: 11/02/2001   CC:         Evelene Croon, M.D.  Rollene Rotunda, M.D.   Discharge Summary  DATE OF BIRTH:  [birth year]  DISCHARGE DIAGNOSES: 1. Unstable angina. 2. Severe three vessel atherosclerotic coronary artery disease.  SECONDARY DIAGNOSES: 1. Type 2 diabetes mellitus. 2. Strong family history of atherosclerotic coronary artery disease. 3. History of tobacco habituation having quit three years ago. 4. Hypertension. 5. Hyperlipidemia. 6. Status post appendectomy/lumbar diskectomy. 7. Pending surgery for spinal stenosis.  PROCEDURE: October 28, 2001:  Coronary artery bypass grafting surgery times five by Dr. Evelene Croon. In this procedure the left internal mammary artery was connected in an end to side fashion to the left anterior descending coronary artery, a reverse saphenous vein graft was fashioned from the aorta to the right coronary artery, a reverse saphenous vein graft was fashioned from the aorta to the diagonal and a sequential reverse saphenous vein graft was fashioned from the aorta to the obtuse marginal and then to the distal circumflex.  The patient tolerated the procedure well and was transferred in stable satisfactory condition to the intensive care unit.  DISCHARGE DISPOSITION: The patient is ready for discharge on postoperative day number five.  He has experienced no cardiac dysrhythmia in the postoperative period. His mental status has remained clear.  At the time of his discharge he is ambulating without desaturation.  His incisions are healing nicely.  There was no erythema, drainage or swelling. His pain is controlled with oral analgesics.  His appetite is improving  and he has full bowel function.  His diabetes has been controlled and he is now on his home dose of Humulin 70/30.  DISCHARGE MEDICATIONS: 1. Percocet 5/325 mg one to two tablets p.o. q.4. to 6h p.r.n. pain. 2. Enteric coated aspirin 325 mg daily. 3. Protonix 40 mg daily. 4. Lipitor 20 mg daily preferably at bedtime. 5. Gemfibrozil 600 mg b.i.d. 6. Avandia 4 mg daily. 7. Humulin 70/30 - 40 units subcutaneous in the morning and 35 units    subcutaneous in the evening. 8. Lopressor - dose to be determined before discharge. 9. Altace - dose to be determined before discharge.  DISCHARGE ACTIVITY: Walk daily to build up his strength. He is asked not to drive until he sees Dr. Laneta Simmers in the office. He is asked not to lift anything heavier than ten pounds for the next six weeks.  DISCHARGE DIET:  Low sodium, low cholesterol, ADA diet.  SPECIAL INSTRUCTIONS:  He may shower daily keeping his incisions clean and dry.  FOLLOW-UP: 1. Follow-up with Dr. Rollene Rotunda two weeks after his discharge.  A    chest x-ray will be taken. 2. He will also see Dr. Laneta Simmers on Tuesday, November 22, 2001 at 10:00 in the    morning. He is to bring his chest x-ray from Dr. Lindaann Slough office.  BRIEF HISTORY:  The patient is a 77 year old gentleman with a history of multiple cardiac risk factors.  He was recently scheduled to undergo back surgery for spinal stenosis, and a preoperative Adenosine Cardiolite was performed.  It showed evidence of a prior inferior infarct and mild to moderate peri-infarct ischemia. His last Cardiolite  study was negative for ischemia; this was done May 2002.  The patient does report mild chest discomfort mainly on the right side.  He has had occasional episodes of squeezing discomfort lasting a few minutes located on the right side of the chest.  He has no radiation of his pain into his jaw or arms.  He does have shortness of breath with ambulation, but this resolves with rest. He  underwent repeat left heart catheterization on Oct 18, 2001. This study showed that the LAD had a focal proximal 70% stenosis and a long midpoint 60% stenosis.  The left circumflex had proximal and mid vessel stenoses with about 50%.  THe right coronary artery was the dominant vessel with a long 70% stenosis before the RV branch and a 99% stenosis before the acute marginal.  The ejection fraction was estimated at 65%.  He was referred to Dr. Laneta Simmers for coronary artery bypass grafting surgery.  Of note, percutaneous intervention by Dr. Riley Kill on Oct 18, 2001 resulted in complete closure of the right coronary artery.  He developed minimal chest pain which resolved completely and did not have any significant electrocardiogram changes with this.  HOSPITAL COURSE: After elective admission to [facility] on June 6, he underwent coronary artery bypass grafting surgery times five as dictated above.  His postoperative convalescence lasted five days.  He was extubated on the day of surgery.  On postoperative day number one he was five pounds fluid positive.  He had gentle diuresis in the postoperative period and has reached his preoperative weight by postoperative day number four.  His creatinine remained at baseline throughout the postoperative period.  His pulmonary status required oxygen support from postoperative day number one to postoperative day number four. He did not have significant desaturation with ambulation and his incentive spirometry effort has been good. His diabetes has been carefully monitored in the postoperative period. He was placed on a glucommander apparatus intraoperatively and in the SICU.  He was placed back on his home dose of Humulin 70/30 on postoperative day number three and has maintained moderate glucose control. He was seen in the postoperative period by the diabetes management coordinator.  As mentioned above, he has  experienced no cardiac dysrhythmias  and his hemodynamics have remained strong. He was placed on his ACE/1 inhibitor on postoperative day number four and of course has been maintained on Lopressor beta blocker since his surgery.  Final doses of both Lopressor and Altace have yet to be determined.  Preoperative dose of beta blocker was Toprol XL 50 mg and preoperative Altace was 10 mg daily.  CONDITION:  Stable and improved. Dictated by:   Loura Pardon, P.A. Attending Physician:  Cleatrice Burke DD:  11/01/01 TD:  11/03/01 Job: [id number]

## 2010-10-10 NOTE — Discharge Summary (Signed)
[place]. [facility]  Patient:    [name], [name] Visit Number: [id number] MRN: [id number]          Service Type: CAT Location: 3700 3701 01 Attending Physician:  Ronaldo Miyamoto Dictated by:   Brita Romp, P.A. Admit Date:  10/18/2001 Discharge Date: 10/20/2001   CC:         [facility]  Rollene Rotunda, M.D. [facility], [place]  Melrose Nakayama, M.D., [facility] Office  Allegra Lai, M.D.   Discharge Summary  DISCHARGE DIAGNOSES: 1. Coronary artery disease, status post cardiac catheterization. 2. Hyperlipidemia. 3. Hypertension. 4. Diabetes.  HOSPITAL COURSE: Mr. Mangiaracina is a pleasant 77 year old male with known coronary artery disease. He was to undergo back surgery for treatment of spinal stenosis. Prior to this, Dr. Reola Calkins discussed this case with Dr. Tenny Craw, who suggested Cardiolite. This was done and was abnormal. The patient was then seen in the office by Dr. Antoine Poche. He felt that given the patients known coronary disease and diabetes, that the patient should undergo cardiac cath. This was set up on an outpatient basis.  HOSPITAL COURSE: On Oct 18, 2001, the patient was taken to the cath lab by Dr. Antoine Poche. Catheterization results are as follows:  CATHETERIZATION RESULTS: 1. Left main coronary artery: with minimal irregularities. 2. Left anterior descending: diffuse luminal irregularities. Focal 70%    stenosis proximally, 60% long stenosis of the mid vessel. 3. Circumflex: the circumflex and AV groove had a diffuse 25%. There was a    long RI with proximal 30% long stenosis and mid 30% long stenosis. 4. By coronary: dominant; distal segment was noted to be under filled and    there was diffuse plaquing throughout. There is long proximal 25% stenosis    followed by a long mid 70% stenosis before the RV branch. There was a mid    99% stenosis before the acute marginal. 5. Left ventriculogram: ejection fraction  is 65%, normal wall motion    abnormality.  CONTINUATION: Dr. Antoine Poche felt that the patient should have percutaneous intervention on the lesion of the right coronary artery. His plan at that time was to possibly repeat a perfusion study to evaluate the significance of the lesion in the left anterior descending. Dr. Riley Kill then attempted to cross the lesion of the right coronary artery. However, he was unsuccessfully. He felt that in view of this lesion as well as the lesion in the left anterior descending, the patient would like need bypass surgery. The next day, the patient was doing well. Had no further chest pain or shortness of breath. That evening, the patient told the nurse and the technician that he sometimes thought about hurting himself while at home. His nurse spoke to him about these feelings and he was closely monitored during the remainder of his stay. On the next day, the patient saw Dr. Antoine Poche. He felt that the patient would need bypass surgery prior to the back surgery. However, he felt that the consult with CVTS could be done on an outpatient basis.  DISCHARGE MEDICATIONS: 1. Coated aspirin 81 mg q.d. 2. Gemfibrozil 600 mg b.i.d. 3. Altace 10 mg q.d. 4. Insulin as previously taken. 5. Avandia 4 mg q.d. 6. Toprol XL 20 mg q.d. 7. Protonix 40 mg q.d. 8. Lipitor 20 mg q.d. 9. SL Nitroglycerin as needed for chest pain.  LABORATORY DATA: Total cholesterol 188, triglycerides 322, HDL 30, LDL 94.  DIAGNOSTIC STUDIES: EKG showed sinus rhythm at 66. PR interval 150,  QRS 86, QTC 396, axis 5. There were also noted to be some nonspecific T wave changes.  ACTIVITY: The patient is to avoid driving, heavy lifting, or tub baths for two days.  DIET: To follow a low fat, low cholesterol diet.  WOUND CARE: To watch the cath site for any pain, bleeding, or swelling. To call the [place] office for any of these problems.  FOLLOW-UP: He is to follow-up with Dr. Laneta Simmers in the  office on October 25, 2001 at 11:00 AM in the morning. He is to follow-up with his physician at [facility] if needed or scheduled. Dictated by:   Brita Romp, P.A. Attending Physician:  Ronaldo Miyamoto DD:  10/20/01 TD:  10/21/01 Job: [id number]

## 2010-10-10 NOTE — Cardiovascular Report (Signed)
[place]. [facility]  Patient:    [name], [name] Visit Number: [id number] MRN: [id number]          Service Type: CAT Location: CCUA 2924 01 Attending Physician:  Ronaldo Miyamoto Dictated by:   Rollene Rotunda, M.D. [facility] Proc. Date: 10/18/01 Admit Date:  10/18/2001   CC:         Montey Hora, P.A., [facility]   Cardiac Catheterization  DATE OF BIRTH: Mar 01, 1934  PRIMARY PHYSICIAN: Montey Hora, P.A.  PROCEDURE: Left heart cardiac catheterization/coronary arteriography.  INDICATIONS: Evaluate patient with a Cardiolite suggesting inferior ischemia.  DESCRIPTION OF PROCEDURE: Left heart catheterization was performed via the right femoral artery.  The artery was cannulated using anterior wall puncture. A #6 French arterial sheath was inserted via the modified Seldinger technique. Preformed Judkins and a pigtail catheter were utilized. The patient tolerated the procedure well.  RESULTS:  HEMODYNAMICS: LV 134/18, AO 134/70.  CORONARY ARTERIOGRAPHY: Left main: The left main had luminal irregularities.  Left anterior descending: The LAD had diffuse luminal irregularities. There was a focal proximal 70% stenosis and a mid long 60% stenosis.  Circumflex: The circumflex in the AV groove had diffuse 25% stenosis. There was a large ramus intermediate with proximal long 30% stenosis and mid long 30% stenosis.  Right coronary artery: The right coronary artery was a dominant vessel. The distal segment was underfilled. There was diffuse plaquing throughout. There was a long proximal 25% stenosis and long mid 70% stenosis before the RV branch. There was mid 99% stenosis before the acute marginal. There did appear to be diffuse distal disease in a small posterolateral and the distal tip of the right coronary.  LEFT VENTRICULOGRAM: The left ventriculogram was obtained in the RAO projection. The EF was approximately 65% with  normal wall motion.  DISTAL AORTOGRAM: The distal aortogram was obtained and demonstrated luminal plaquing and tortuous vessels.  CONCLUSIONS: Diffuse luminal plaquing. High-grade right coronary disease.  PLAN: The patient will have percutaneous revascularization of the right coronary artery. I will then follow him and possibly repeat a perfusion study to evaluate the hemodynamic significance of the LAD lesion. He will continue with aggressive secondary risk factor modification. Dictated by:   Rollene Rotunda, M.D. [facility] Attending Physician:  Ronaldo Miyamoto DD:  10/18/01 TD:  10/19/01 Job: [id number]

## 2010-10-10 NOTE — Discharge Summary (Signed)
NAME:  [name], [name]                       ACCOUNT NO.:  [id number]   MEDICAL RECORD NO.:  [id number]                   PATIENT TYPE:  INP   LOCATION:  0450                                 FACILITY:  [facility]   PHYSICIAN:  Marlowe Kays, M.D.               DATE OF BIRTH:  1934/04/25   DATE OF ADMISSION:  04/27/2002  DATE OF DISCHARGE:  04/30/2002                                 DISCHARGE SUMMARY   ADMISSION DIAGNOSES:  1. Spinal stenosis at L3-4, L5-S1.  2. Hypercholesterolemia.  3. Coronary artery disease.  4. Insulin-dependent diabetes mellitus.  5. Hypertension.  6. History of myocardial infarction.  7. Gastroesophageal reflux disease.   DISCHARGE DIAGNOSES:  1. Spinal stenosis at L3-4 and L5-S1, status post central decompression of     L3-S1.  2. Hypercholesterolemia.  3. Coronary artery disease.  4. Insulin-dependent diabetes mellitus.  5. Hypertension.  6. History of myocardial infarction.  7. Gastroesophageal reflux disease.   PROCEDURE:  The patient was taken to the operating room on 04/27/02, and  underwent central decompression of L3 to S1 by Dr. Marlowe Kays,  assistant was Dr. Ronnell Guadalajara.  Surgery was done under general  anesthesia.   CONSULTATIONS:  Physical therapy.   HISTORY OF PRESENT ILLNESS:  The patient is a 77 year old male with a long  history of low back pain secondary to spinal stenosis at L3-4 as well as L5-  S1.  The patient has undergone conservative therapy, however, persist to  having increasing low back pain.  Therefore, he has opted to undergo  surgical intervention.  The risks and benefits of the procedure were  discussed with the patient, and the patient wishes to proceed.  Surgery and  OR had been scheduled tonight, however due to impending myocardial  infarction, the patient needed to undergo coronary artery bypass grafting,  it is being delayed until 04/27/02.   LABORATORY DATA:  CBC on admission showed hemoglobin of 13.1,  hematocrit  38.0, white blood cell count 7.1, red blood cell count 3.46.  Differential  on admission all within normal limits.  Coagulation studies on admission all  within normal limits.  Routine chemistries on admission showed glucose  slightly high at 144.  Urinalysis on admission showed 100 mg/dl of glucose,  30 mg/dl of protein, and a few epithelial.  Preoperative EKG shows normal  EKG.  No other radiographs noted.   HOSPITAL COURSE:  The patient was admitted to [facility] and taken  to the operating room.  He underwent the above stated procedure without  complications.  The patient tolerated the procedure well, was allowed to  return to the recovery room and then to the orthopaedic floor for routine  postoperative care.  On postoperative day #1, the patient was complaining of  back pain as expected.  Pain resolved in lower extremity.  Neurovascularly  intact bilateral lower extremities.  Had good strength to bilateral lower  extremities.  Physical therapy consult reinforce dressing, hopeful for  discharge the following day.  On 04/29/02, orthopaedics, postoperative day  #2, the patient was comfortable, wound looked clean, dry, and intact.  Doing  well.  Was slightly slow with physical therapy.  On 04/30/02, postoperative  day #3, the patient wanted to go home.  The patient was discharged home on  this day.   DISPOSITION:  The patient was discharged home on 04/30/02.   DISCHARGE MEDICATIONS:  1. Percocet 5/325 mg one or two p.o. q.4-6h. p.r.n.  2. Robaxin 500 mg one or two p.o. q.6-8h. p.r.n.   DIET:  As tolerated.   ACTIVITY:  The patient is to be up as tolerated.  No lifting, prolonged  sitting, or standing.   FOLLOWUP:  The patient is to follow up with Dr. Simonne Come two weeks from  date of surgery.  He is to call for an appointment.   CONDITION ON DISCHARGE:  Stable and improved.     Clarene Reamer, P.A.-C.                   Marlowe Kays, M.D.    SW/MEDQ   D:  05/08/2002  T:  05/08/2002  Job:  [id number]

## 2010-10-10 NOTE — H&P (Signed)
NAME:  [name], [name]                       ACCOUNT NO.:  [id number]   MEDICAL RECORD NO.:  [id number]                   PATIENT TYPE:  INP   LOCATION:  NA                                   FACILITY:  [facility]   PHYSICIAN:  Marlowe Kays, M.D.               DATE OF BIRTH:  01-Feb-1934   DATE OF ADMISSION:  04/27/2002  DATE OF DISCHARGE:                                HISTORY & PHYSICAL   CHIEF COMPLAINT:  Low back pain with right radicular pain.   HISTORY OF PRESENT ILLNESS:  The patient is a 77 year old male with a long  history of low back pain secondary to spinal stenosis at L3-4 as well as L5-  S1.  The patient has undergone conservative therapy; however, persists to  have increasing low back pain.  Therefore, he has opted to undergo surgical  intervention.  The risks and benefits have been discussed with patient and  the patient wishes to proceed.  The surgery had originally been scheduled in  May 2003;  however, due to impending MI and patient needing to undergo  coronary artery bypass graft, surgery has been delayed until April 27, 2002.   PAST MEDICAL HISTORY:  1. Hypercholesterolemia.  2. Coronary artery disease.  3. Insulin-dependent diabetes mellitus.  4. Hypertension.  5. History of MI.  6. Gastroesophageal reflux disease.   PAST SURGICAL HISTORY:  1. Appendectomy.  2. Previous lumbar surgery.  3. Angioplasty.  4. Colon polypectomy.  5. Coronary artery bypass graft x5.   MEDICATIONS:  1. Toprol XL 50 mg one p.o. daily.  2. Gemfibrozil 600 mg one p.o. b.i.d.  3. Lipitor 20 mg one p.o. daily.  4. Altace 2.5 mg one p.o. b.i.d.  5. Protonix 40 mg one p.o. daily.  6. Avandia 4 mg one b.i.d.  7. Humulin insulin 70/30 shot in a.m. and shot in p.m.  8. Vicodin p.r.n.   ALLERGIES:  No known drug allergies.   SOCIAL HISTORY:  The patient denies any tobacco or alcohol use.  He is  married.  He lives in a one story home with five steps entering the home.  His  wife will be his caregiver after surgery.   FAMILY HISTORY:  The patient states that mother and father both had high  blood pressure and heart trouble.   REVIEW OF SYSTEMS:  GENERAL:  Denies fevers, chills, night sweats, bleeding  tendencies.  CNS:  Denies blurry or double vision, seizures, headaches,  paralysis.  RESPIRATORY:  Denies shortness of breath, productive cough,  hemoptysis.  CARDIOVASCULAR:  Denies chest pain, angina, orthopnea.  GASTROINTESTINAL:  Denies nausea, vomiting, diarrhea, constipation, melena,  or bloody stool.  GENITOURINARY:  No dysuria, hematuria, discharge.  MUSCULOSKELETAL:  Pertinent to HPI.   PHYSICAL EXAMINATION:  VITAL SIGNS:  Blood pressure 120/60, pulse 80,  respirations 16.  GENERAL:  Well-developed, well-nourished 77 year old male.  HEENT:  Normocephalic, atraumatic.  Pupils are equal,  round, and reactive to  light.  NECK:  Supple.  No carotid bruit.  CHEST:  Clear to auscultation bilaterally.  No wheezes or crackles.  HEART:  Regular rate and rhythm.  No murmur, rub, or gallop.  ABDOMEN:  Soft, nontender, nondistended.  Positive bowel sounds x4.  EXTREMITIES:  He has a well-healed lower lumbar incision consistent with L5-  S1 disk surgery.  He can heel-toe walk.  He basically has no extension  __________ with flexion limited of his fingertip to his knees.  Sitting  there he has a negative straight leg raise and negative figure-of-four test  bilaterally.  Motor strength, sensation, and circulation test normally in  both lower extremities.  Knee reflexes are trace.  Ankle reflexes 1/2+ on  the right and 0 on the left which would seem to indicate he had L5-S1 disk  on the left previously.  SKIN:  No rashes or lesions.   LABORATORIES:  X-ray:  Significant lumbar spondylosis at every level with  large osteophytes present and almost disk type picture in the upper lumbar  spine.  Hips look normal.  He has degeneration of the L5-S1 disk, again   consistent with surgery at this level.   IMPRESSION:  1. Spinal stenosis L3-4 and L5-S1.  2. Hypercholesterolemia.  3. Coronary artery disease.  4. Insulin-dependent diabetes mellitus.  5. Hypertension.  6. History of myocardial infarction.  7. Gastroesophageal reflux disease.   PLAN:  The patient will be admitted to [facility] on April 27, 2002, and undergo central decompression at L3-S1 by Dr. Marlowe Kays.     Clarene Reamer, P.A.-C.                   Marlowe Kays, M.D.    SW/MEDQ  D:  04/19/2002  T:  04/19/2002  Job:  [id number]

## 2010-10-10 NOTE — Assessment & Plan Note (Signed)
[facility]                              CARDIOLOGY OFFICE NOTE   [name], [name]                    MRN:          [id number]  DATE:01/27/2006                            DOB:          [birth year]    PRIMARY CARE PHYSICIAN:  Kirstie Peri, MD   REASON FOR PRESENTATION:  Evaluate patient with coronary artery disease.   HISTORY OF PRESENT ILLNESS:  The patient is a 77 year old gentleman who  returns for followup of his known coronary disease.  He has had no new  symptoms since I saw him.  He is very limited in his activities (sometimes  he drives his lawnmower 50 yards to the chicken coop).  He denies any  exertional chest discomfort.  He has some chest discomfort when his wife  hits pot holes while they are driving.  He has dyspnea which was chronic if  he does walk the 50 yards through his yard.  He has no resting shortness of  breath.  He denies any PND or orthopnea.   PAST MEDICAL HISTORY:  Coronary artery disease, status post CABG in October 29, 1998 with a LIMA to the LAD, SVG to diagonal, SVG sequential to obtuse  marginal and distal circumflex, sequential SVG to distal right coronary  artery.  Hypertension.  Diabetes.  Appendectomy.  Lumbar disk surgery.  Morbid obesity.   ALLERGIES:  None.   MEDICATIONS:  Aspirin 325 mg daily.  Omeprazole 20 mg daily.  Celebrex.  Lipitor 20 mg daily.  Hydrochlorothiazide 25 mg daily.  __________.  Levemir, Metformin 500 mg b.i.d., alprazolam, Actos 30 mg daily.   REVIEW OF SYSTEMS:  As stated in the HPI, otherwise negative for other  systems.   PHYSICAL EXAMINATION:  GENERAL:  Patient is in no distress.  VITAL SIGNS:  Blood pressure 130/78, heart rate 88 and regular.  Weight 270  pounds.  Body Mass Index 34.  NECK:  No jugular venous distention at 45 degrees.  Carotid upstroke brisk  and symmetric.  No bruits, no thyromegaly.  LYMPHATICS:  No adenopathy.  LUNGS:  Clear to auscultation  bilaterally.  BACK:  No costovertebral angle tenderness.  CHEST:  Well-healed sternotomy scar.  HEART:  PMI not displaced or sustained.  S1 and S2 within normal limits.  No  S3, no S4, no murmurs.  ABDOMEN:  Obese, positive bowel sounds.  Normal in frequency and pitch.  No  bruits, rebound, guarding.  There are no midline pulsatile masses.  No  organomegaly.  SKIN:  No rashes, no nodules.  EXTREMITIES:  Pulses 2+.  No edema.   EKG:  Sinus rhythm.  Rate 88.  Premature atrial contractions.  Atrial  bigeminy.  No acute ST-T wave changes.   ASSESSMENT/PLAN:  1. Coronary disease.  Patient has no new symptoms.  Unfortunately, he does      not participate in risk reduction.  He takes his medications and sees      his doctors routinely; however, he drinks buttermilk or sugar soda.  He      does not get any activity at all.  He is  at high risk for      cardiovascular events, and I have discussed this with him.  At this      point, no further cardiovascular testing is suggested.  2. Followup:  I will see him back in one year or sooner if needed.                               Rollene Rotunda, MD, [facility]    JH/MedQ  DD:  01/27/2006  DT:  01/27/2006  Job #:  [id number]   cc:   Kirstie Peri, MD

## 2010-10-10 NOTE — Cardiovascular Report (Signed)
[place]. [facility]  Patient:    [name], [name] Visit Number: [id number] MRN: [id number]          Service Type: CAT Location: CCUA 2924 01 Attending Physician:  Ronaldo Miyamoto Dictated by:   Arturo Morton Riley Kill, M.D. [facility] Proc. Date: 10/18/01 Admit Date:  10/18/2001   CC:         Rollene Rotunda, M.D. [facility]  Monica Becton, M.D.  Cardiac Catheterization Lab   Cardiac Catheterization  INDICATIONS:  Mr. Busche is a 77 year old gentleman followed by Dr. Antoine Poche.  He knows him well.  The patient has had previous intervention of the circumflex coronary artery.  He is scheduled to have back surgery.  The patient had a Cardiolite which demonstrated a new abnormality which included an inferior infarct with periinfarct ischemia.  He was brought to the catheterization laboratory by Dr. Antoine Poche and diagnostic catheterization was performed.  This revealed TIMI 1-2 flow in the distal right coronary with a subtotal occlusion.  There was also some progression in the LAD.  I reviewed the films with Dr. Antoine Poche and he felt that we should consider percutaneous intervention to the right coronary.  The right coronary was moderately tortuous.  I discussed this with the patient and subsequently with his wife. The decision was made to electively proceed with an attempted percutaneous intervention.  The patient was agreeable.  DESCRIPTION OF PROCEDURE:  Following diagnostic catheterization by Dr. Antoine Poche, a #6 French in-dwelling sheath was exchanged for a #7 [place] sheath. Heparin and Integrilin were given according to protocol.  A JR4 guiding catheter with sideholes was utilized to intubate the right coronary artery. There was marked tortuosity in the proximal right with an upward takeoff followed by a downward and then outward direction of the vessel.  We initially used a traverse wire to try to navigate this area.  The ACT was in excess of 200 seconds.   We could never get the wire across the distal stenosis.  We then attempted to support this with a balloon and again, we could not get across the lesion.  We ended up using an OpenSail balloon which we placed in the vessel, and multiple wires were utilized to attempt to cross, including an intermediate Graphix PT, BMW wires.  We were never able to cross the distal lesion, possibly due to subintimal location.  There was diminished flow.  The patient had minimal chest pain and mostly back pain and was given narcotic analgesics.  The chest pain was completely relieved at the completion of the procedure and the EKG in the lab did not show evidence of ST elevation.  At this point, we elected to discontinue the procedure because of inability to cross.  All catheters were removed.  The femoral sheath was sewn into place. The final image was on a separate Agricultural engineer.  ANGIOGRAPHIC DATA:  At the beginning of the procedure, the right coronary demonstrated marked tortuosity.  There was a 70% mid stenosis followed by a subtotal occlusion with TIMI 1-2 flow in the distal artery.  Following the completion of the procedure, the vessel was totally occluded in the distal portion of the mid vessel with TIMI-0 flow.  There was no chest pain or ST elevation at the completion of the procedure.  CONCLUSIONS:  Unsuccessful attempt at percutaneous intervention due to inability to cross.  PLAN:  I plan to review the studies with Dr. Antoine Poche.  The patient needs back surgery and would require a complete  revascularization as he does have some progression in the LAD territory.  PROCEDURES: 1. Right and left heart catheterization. 2. Selective coronary arteriography. 3. Selective left ventriculography.  DESCRIPTION OF PROCEDURE:  The procedure was performed ANGIOGRAPHIC DATA: 1. Ventriculography in the RAO projection reveals  2. The left main coronary artery  3. The left anterior descending  artery  4. The AV circumflex  5. The right coronary artery  CONCLUSIONS: 1. 2.  DISPOSITION: Dictated by:   Arturo Morton. Riley Kill, M.D. [facility] Attending Physician:  Ronaldo Miyamoto DD:  10/18/01 TD:  10/19/01 Job: [id number]

## 2010-10-10 NOTE — Cardiovascular Report (Signed)
[place]. [facility]  Patient:    [name], [name]                      MRN: [id number] Proc. Date: 10/12/00 Adm. Date:  [id number] Attending:  Mirian Mo CC:         Montey Hora, P.A., [facility]  Rollene Rotunda, M.D. [facility]  Cardiopulmonary Laboratory   Cardiac Catheterization  PROCEDURES PERFORMED:  Cardiac catheterization.  INDICATIONS:  The patient is a 77 years old and has diabetes, hypertension, and hyperlipidemia and documented nonobstructive coronary disease at catheterization in 2000.  At that time he had 50% stenosis in the LAD, 40% stenosis in the circumflex artery and 40% stenosis in the distal right coronary artery.  He was admitted with chest pain suggestive of unstable angina with negative enzymes.  DESCRIPTION OF PROCEDURE:  The procedure was performed via the right femoral artery using an arterial sheath and 6 French preformed coronary catheters.  A front wall arterial puncture was performed and Omnipaque contrast was used.  A distal aortogram was performed to rule out abdominal aortic aneurysm.  The right femoral artery was closed with AngioSeal at the end of the procedure. The patient tolerated the procedure well and left the laboratory in satisfactory condition.  RESULTS:  The left main coronary artery:  The left main coronary artery was free of significant disease.  Left anterior descending:  The left anterior descending artery was irregular with a 70% proximal and 70% mid stenosis.  The LAD gave rise to two septal perforators and a large and small diagonal branch.  Circumflex artery:  The circumflex artery gave rise to a large marginal branch, an atrial branch, small marginal branch, and two small posterolateral branches.  There was 40% narrowing in the proximal portion of the first large marginal branch.  Right coronary artery:  The right coronary is a moderate sized vessel that gave rise to a  right ventricular branch, posterior descending branch, and three posterolateral branches.  There is 40% narrowing in the mid to distal vessel.  LEFT VENTRICULOGRAM:  The left ventriculogram performed in the RAO projection showed good wall motion with no areas of hypokinesis.  The estimated ejection fraction was 60%.  DISTAL AORTOGRAM:  A distal aortogram was performed which showed patent renal arteries and no significant aortoiliac obstruction.  The aortic pressure was 145/81 with a mean of 108.  Left ventricular pressure was 145/20.  CONCLUSIONS:  Coronary artery disease with 70% stenosis in the proximal and mid left anterior descending artery, 40% narrowing in the marginal branch of the circumflex artery and 40% narrowing in the distal right coronary artery with normal left ventricular function.  RECOMMENDATIONS:  The patients coronary lesions do not appear to be tight enough to be flow-limiting and I suspect his recent symptoms are probably not anginal.  We will plan to do an outpatient Cardiolite scan to exclude the possibility that his LAD lesions are flow-limiting.  In the meantime, we will treat him with a proton pump inhibitor and he has a followup appointment to see Dr. Ewing Schlein as an outpatient.  We will arrange followup with Dr. Antoine Poche in the [facility] following the Cardiolite scan. DD:  10/12/00 TD:  10/12/00 Job: [id number]

## 2010-10-10 NOTE — Procedures (Signed)
[facility]  Patient:    [name], [name] Visit Number: [id number] MRN: [id number]          Service Type: END Location: ENDO Attending Physician:  Nelda Marseille Dictated by:   Petra Kuba, M.D. Proc. Date: 06/24/01 Admit Date:  06/24/2001   CC:         Monica Becton, M.D.   Procedure Report  PROCEDURE:  Colonoscopy with multiple polypectomies.  INDICATIONS FOR PROCEDURE:  A patient lost to follow-up, with an abnormal BE pertinent for polyps.  Says he has had polyps before.  Consent was signed after risks, benefits, methods and options thoroughly discussed in the office.  MEDICATIONS USED:  Demerol 70 mg, Versed 7 mg.  DESCRIPTION OF PROCEDURE:  Rectal inspection was pertinent for external hemorrhoids -- small.  Digital examination was negative.  The video colonoscope was inserted and in the proximal rectum and distal sigmoid two moderate-sized polyps were seen.  We were then easily able to advance probably to the mid transverse.  At that point there was looping, he had a long, tortuous colon.  We rolled him on his back on right side with multiple abdominal pressure, and possibly we got to the mid ascending.  We had inserted the entire scope on multiple occasions.  We even rolled him back to his left side, but could not advance any further.  We went ahead and slowly withdrew.  He had multiple, tiny to small transverse and left-sided polyps, which were hot biopsied and put in the first container. In the more proximal sigmoid were a few small polyps, which were hot biopsied and put in a second container.  We then withdrew back to the polyps in the proximal rectum and distal sigmoid.  Both required snares.  Polyps were removed, suctioned onto the head of the scope, and then the scope was removed. Once back in the rectum, the scope was retroflexed; revealing some internal hemorrhoids.  On one of these snared polyps in the rectum pus  submitted with some minimal residual adenomatous tissue.  These moderate-sized polyps were put in a third container, and we took two hot biopsies of the base.  The scope was then reinserted, and again easily advanced to questionably the transverse. No additional findings were seen.  Air was suctioned and scope removed.  The prep, by the way not mentioned above, was adequate.  We did have to do some liquid washing and suctioning of stool.  The patient tolerated the procedure well.  There was no obvious immediate complication.  ENDOSCOPIC DIAGNOSES: 1. Internal and external hemorrhoids. 2. Two moderate distal sigmoid and proximal rectal polyps, snared with hot    biopsy at one of the bases. 3. Multiple left-sided and transverse tiny to small polyps, hot biopsied. 4. Otherwise within normal limits to questionably the ascending colon.  PLAN:  Await pathology to determine the future colonic screening.  Will also determine follow up at that juncture.  Will need to check his chart to see if he has any recent upper GI or endoscopy for some of his upper tract symptoms. Probably will recheck a barium enema in six months to a year to recheck the right side.  Will put him on the two-week customary post-polypectomy instructions. Dictated by:   Petra Kuba, M.D. Attending Physician:  Nelda Marseille DD:  06/24/01 TD:  06/24/01 Job: [id number]

## 2010-10-10 NOTE — Op Note (Signed)
NAME:  [name], [name] NO.:  [id number]   MEDICAL RECORD NO.:  [id number]                   PATIENT TYPE:  INP   LOCATION:  0005                                 FACILITY:  [facility]   PHYSICIAN:  Marlowe Kays, M.D.               DATE OF BIRTH:  1933/10/19   DATE OF PROCEDURE:  04/27/2002  DATE OF DISCHARGE:                                 OPERATIVE REPORT   PREOPERATIVE DIAGNOSIS:  Spinal stenosis, L3-4 and L5-S1.   POSTOPERATIVE DIAGNOSIS:  Spinal stenosis, L3-4 and L5-S1.   OPERATION:  Central decompressive laminectomy, L3-4 and L5-S1.   SURGEON:  Illene Labrador. Aplington, M.D.   ASSISTANT:  Philips J. Montez Morita, M.D.   ANESTHESIA:  General.   PATHOLOGY AND JUSTIFICATION FOR PROCEDURE:  Significant, progressive back  and particularly bilateral leg pain with recent giving-away of the left leg.  The pain is more in the thigh.  Myelogram has demonstrated central stenosis  at L3-4 with decreased feeling of L4 nerve root bilaterally.  He has had  previous  surgery at L5-S1 on the right 15 years ago.  There is no  significant abnormality on the myelogram, but he did have significant recess  stenosis, right much more than the left at L5-S1.  It was felt that both  levels and both sides at each level were potential contributors to his pain  pattern.  There did not appear to be any significant abnormality at L4-5 and  consequently, no surgery was performed at this level.   DESCRIPTION OF PROCEDURE:  Prophylactic antibiotics, satisfactory general  anesthesia, knee-chest position on the [place] frame.  Back was prepped with  DuraPrep and draped in a sterile field, Ioban employed.  Went through the  lower part of the previous incision and tagged spinous processes in the  depths of the wound.  It was clear that he had had previous surgery at this  level.  The clamp marker was on the spinous process of L5 with a second  marker we placed at the L5-S1 interspace.  I  then extended my incision  proximally until we felt we had arrived at the L3 spinous process.  Soft  tissue was dissected off the lamina on both sides, and we took two  additional films to tentatively localize once again with Kocher clamps the  L3 and L4 spinous processes and subsequently during the decompression at L3-  4, a third and final lateral x-ray, indicating that we had decompressed at  this level.  Working first at L5-S1, we carefully dissected soft tissue off  the previous laminectomy defect at L5-S1 on the right and the soft tissue  off the L5-S1 on the left as well and placed a self-retaining McCullough  retractor which we also placed at L3-4 as well.  Starting with double-action  rongeurs, we removed the superior portion of the spinous process of the  sacrum and the inferior portion of  the spinous process of L5 as well as a  portion of the neural arch of L5.  Then with a combination of Kerrison  rongeurs, I carefully dissected out bone and ligamentum flavum centrally and  laterally, first working on the left and decompressing the S1 nerve root and  then carefully moving to the right and working around the previous perimeter  of the laminectomy defect.  Some residual scar over the dura at this level  was trimmed up, but we were careful not to create a dural tear.  Following  thorough decompression of both S1 nerve roots and the foramina and  laterally, then went to L3-4 and once again performed central decompression  at this level as well.  The L4 nerve roots were very irritable, indicating  that this is probably the major site of his symptoms.  After what we felt  was a thorough decompression, we brought in the microscope and performed a  little additional decompression bilaterally at L3-4 and then irrigated the  wound and made sure that it was decompressed both cephalad and caudad and  then irrigated the wound well and placed Gelfoam over the dura.  We then  went down with  the scope at L5-S1 and did some minimal tidying up with  decompression and also irrigated this area as well and placed Gelfoam over  the dura.  Self-retaining retractors were then removed.  He had some minimal  muscular bleeding which I corrected.  He was given 30 mg of Toradol IV, and  closure was performed.  There was minimal bleeding, so I did not feel a  drain was necessary.  Paralumbar muscles were reapproximated with  interrupted #1 Vicryl as was the fascia, subcutaneous tissue came together  with the same stitch, and staples on the skin.  Betadine, Adaptic dry  sterile dressing were applied.  He tolerated the procedure well and was  gently rolled on his bed and taken to he PACU in satisfactory condition.  There were no known complications.  Estimated blood loss was perhaps 300 cc  and no blood replacement.                                               Marlowe Kays, M.D.    JA/MEDQ  D:  04/27/2002  T:  04/27/2002  Job:  [id number]

## 2010-10-10 NOTE — Op Note (Signed)
[name], [name]             ACCOUNT NO.:  [id number]   MEDICAL RECORD NO.:  [id number]          PATIENT TYPE:  OBV   LOCATION:  0480                         FACILITY:  [facility]   PHYSICIAN:  Marlowe Kays, M.D.  DATE OF BIRTH:  01-30-1934   DATE OF PROCEDURE:  04/16/2004  DATE OF DISCHARGE:                                 OPERATIVE REPORT   PREOPERATIVE DIAGNOSES:  1.  Lateral recess stenosis and disk protrusion/herniated nucleus pulposus,      L3-4.  2.  Spondylosis with foraminal stenosis at L5-S1.   POSTOPERATIVE DIAGNOSES:  1.  Lateral recess stenosis and disk protrusion/herniated nucleus pulposus,      L3-4.  2.  Spondylosis with foraminal stenosis at L5-S1.   OPERATION:  1.  Repeat central and lateral recess decompression, L3-4.  2.  Central and foraminal decompression, L5-S1.   SURGEON:  Marlowe Kays, M.D.   ASSISTANT:  Georges Lynch. Darrelyn Hillock, M.D.   ANESTHESIA:  General.   INDICATIONS FOR PROCEDURE:  He has had previous disk surgery at L5-S1 on the  right and central decompressive laminectomy at L3-4.  He has had bilateral  leg pain and today numbness on the anterior left thigh, consistent more with  L3-4 disease but also has some numbness and pain down the left leg to the  foot, more consistent with L5-S1 disease.  Myelogram has demonstrated  lateral recess stenosis at L3-4 and decreased filling of the S1 nerve roots  bilaterally.  MRI with gadolinium is demonstrated as disk protrusion/HNP at  L3-4 and bulging at L5-S1.  Preoperatively, he and his family were advised  of all of the risks and complications associated with repeat surgery of this  magnitude.   PROCEDURE:  Prophylactic antibiotics, satisfactory general anesthesia, Foley  catheter inserted.  Knee-chest position on the [place] frame.  The back was  prepped with Duraprep and prepped and draped in a sterile fashion.  We went  through the old surgical incision and dissected soft tissue off the spine  from roughly L3 to the sacrum.  Took a lateral x-ray with Kocher clamps,  which turned out to be on L4 and the sacrum.  Based on this, we continued  dissected more proximally and took a second lateral x-ray with a Kocher  clamp on the spinous process of L3 and a Penfield and Derrico retractor at  the L3-4 interspace to better see the level of the disk.  We then continued  dissection.  I worked first centrally at the L5-S1, removing bone and  ligamentum flavum so that we could decompress the L5-S1 space and  particularly perform foraminotomies, which is where the problem laid.  There  is a good bit of bone spurring in the foramen.  Used the microscope for the  terminal part of this decompression.  We then went to 3-4, where the going  was very slow because of significant scar.  Ended up having to remove a good  bit of the spinous process of L3 to work centrally first and then laterally  to either side.  Used a bur, 2-3 mm Kerrison rongeurs, double-action  rongeur,  and we were finally able to decompress the meninges without any  iatrogenic injury.  On the right side, there was fairly significant  indentation externally.  When we had complete what we felt was a thorough  decompression with a foramina for the L3-4 nerve roots decompressed, we  tried to mobilize the dura enough to get to the disk but were not able to do  so.  It was tightly adherent, and we were unable to visualize the disk.  By  probing with a Penfield 4 instrument, it did feel flat.  We felt there was a  thorough decompression, that we had relieved the pressure on the back side.  Then irrigated the wound well with sterile saline.  Gelfoam was placed over  all of the decompressed areas at both levels and then closed the wound over  a 1/4 inch Penrose drain with interrupted #1 Vicryl and paralumbar muscle  and fascia, 2-0 Vicryl subcutaneous tissue, and staples in the skin.  Betadine Adaptic and dry sterile dressing were applied.   He tolerated the  procedure well.  He was taken to the recovery room in satisfactory condition  with no known complications.  Estimated blood loss was 850 cc.  No blood  replacement.      JA/MEDQ  D:  04/16/2004  T:  04/16/2004  Job:  [id number]

## 2010-10-10 NOTE — Op Note (Signed)
[place]  Patient:    [name], [name] Visit Number: [id number] MRN: [id number]          Service Type: SUR Location: 2300 2316 01 Attending Physician:  Cleatrice Burke Dictated by:   Alleen Borne, M.D. Proc. Date: 10/28/01 Admit Date:  10/28/2001   CC:         CVTS office  Rollene Rotunda, M.D. [facility]  [place]   Operative Report  PREOPERATIVE DIAGNOSIS:  Severe three vessel coronary artery disease with unstable angina.  POSTOPERATIVE DIAGNOSIS:  Severe three vessel coronary artery disease with unstable angina.  OPERATIVE PROCEDURE:  Median sternotomy, extracorporeal circulation, coronary artery bypass graft surgery x5 using a left internal mammary artery graft to the left anterior descending coronary artery, with a saphenous vein graft to the diagonal branch of the LAD, a sequential saphenous vein graft to the obtuse marginal and distal left circumflex coronary artery, and a saphenous vein graft to the distal right coronary artery.  SURGEON:  Alleen Borne, M.D.  ASSISTANT:  Adair Patter, P.A.-C.  ANESTHESIA:  General endotracheal.  CLINICAL HISTORY:  The patient is a 77 year old gentleman with a history of multiple cardiac risk factors and coronary artery disease documented by catheterization in May of 2002.  Cardiolite scan at that time was negative for ischemia or infarction.  He quit smoking, has been watching his diet, and taking his medications as prescribed.  He recently underwent cardiology evaluation prior to undergoing surgery for spinal stenosis and a preoperative Adenosine-Cardiolite scan showed prior inferior infarct with mild to moderate peri-infarct ischemia.  This was a significant change from his previous study. He underwent a repeat cardiac catheterization on Oct 18, 2001, which showed a progression of his coronary disease.  The LAD had diffuse luminal irregularities with a focal  proximal 70% stenosis and a long mid 60% stenosis. The left circumflex had a large intermediate or first marginal branch with proximal and mid vessel stenosis of about 50%.  The right coronary artery was dominant and had a long 70% stenosis before the right ventricular branch and then a 99% stenosis before the acute marginal with TIMI-1 to 2 flow.  Left ventricular ejection fraction was 65%.  He underwent an attempted percutaneous intervention by Dr. Riley Kill on Oct 18, 2001, which resulted in complete closure of the right coronary artery.  He developed minimal chest pain and this resolved completely and did not have any significant EKG changes.  After review of the angiogram and examination of the patient, it was felt that coronary artery bypass graft surgery was the best treatment to prevent further ischemia and infarction, and allow him to undergo his spinal stenosis surgery. I discussed the operative procedure with the patient and his wife including alternatives, benefits, and risks including bleeding, possible blood transfusion, infection, stroke, myocardial infarction, and death.  They understood and agreed to proceed.  DESCRIPTION OF PROCEDURE:  The patient was taken to the operating room and placed on the table in the supine position.  After induction of general endotracheal anesthesia, a Foley catheter was placed in the bladder using sterile technique.  Then, the chest, abdomen, and both lower extremities were prepped and draped in the usual sterile manner.  The chest was entered through a median sternotomy incision.  The pericardium was opened in the midline. Examination of the heart showed good ventricular contractility.  The ascending aorta had no palpable plaques in it.  Then, the left internal mammary artery  was harvested from the chest wall as a pedicle graft.  This was a medium caliber vessel with excellent blood flow through it.  At the same time, a segment of greater  saphenous vein from harvested from the right leg, and the vein was of medium size and good quality.  Then, the patient was heparinized, and when an adequate accurate declotting time was achieved, the distal ascending aorta was cannulated, and using a 24-French aortic cannula for arterial inflow.  Venous outflow was achieved using a two-stage venous cannula through the right atrial appendage.  An antegrade cardioplegia and vent cannula was inserted in the aortic root.  The patient was placed on cardiopulmonary bypass where the distal coronary was identified.  The LAD was intramyocardial at its proximal and middle portions, and exited in its distal portion to the surface of the heart.  There was some patchy plaque distally in the artery.  The diagonal branch was a large graftable vessel with minimal distal disease in it.  The first marginal or intermediate artery was intramyocardial, but lying just beneath the surface of the muscle, and was a large graftable vessel.  The distal left circumflex was a small, but graftable vessel.  The right coronary artery was diffusely diseased.  This disease extended out to the takeoff of the posterior descending branch as well as into the proximal portion of the posterior descending artery.  The posterior descending itself as well as the posterolateral branches were small nongraftable vessels.  The area of the distal left circumflex just before the takeoff of the posterior descending branch was soft enough to graft.  Then, the aorta was cross-clamped and 500 cc of cold blood antegrade cardioplegia was administered in the aortic root with quick arrest of the heart.  Systemic hypothermia to 28 degrees centigrade and topical hypothermia with ice saline was used.  A temperature probe was placed in the septum and an insufflating pad in the pericardium.   The first distal anastomosis was performed to the intermediate coronary artery.  The internal diameter  was about 2.5 mm.  The conduit used was a segment of greater saphenous vein and the anastomosis performed in a sequential side-to-side manner using continuous 7-0 Prolene suture.  Flow was measured through the graft and was excellent.  A second distal anastomosis was performed to the distal left circumflex coronary artery.  The internal diameter was about 1.6 mm.  The conduit used was the same segment of greater saphenous vein and the anastomosis performed in a sequential end-to-side manner using continuous 7-0 Prolene suture.  Flow was measured through the graft and was excellent.  Then, another dose of cardioplegia was given down the vein graft and in the aortic root.  The third distal anastomosis was performed to the distal right coronary artery.  The internal diameter was greater than 2.5 mm.  There was some plaque in this area of the distal anastomosis.  The conduit used was a second segment of greater saphenous vein and the anastomosis performed in an end-to-side manner using continuous 7-0 Prolene suture.  Flow was measured through the graft and was excellent.  Then, another dose of cardioplegia was given down the vein graft.  The fourth distal anastomosis was performed to the diagonal branch.  The internal diameter of this vessel was 1.75 mm.  The conduit used was a third segment of greater saphenous vein.  The anastomosis was performed in an end-to-side manner using continuous 7-0 Prolene suture.  Flow was measured through the graft  and was excellent.  Then, another dose of cardioplegia was given down the vein grafts and in the aortic root.  The fifth distal anastomosis was performed to the distal portion of the left anterior descending coronary artery.  The internal diameter in this area was about 1.75 mm.  The conduit used was a left internal mammary graft and it was brought through an opening in the left pericardium and anterior to the phrenic nerve.  It was anastomosed to  the LAD in an end-to-side manner using continuous 8-0 Prolene suture.  The pedicle was tacked to the epicardium with 6-0 Prolene sutures.  The patient was rewarmed to 37 degrees centigrade and the clamp removed from the mammary and pedicle.  There was rapid rewarming of the ventricular septum and return of spontaneous ventricular fibrillation. The cross-clamp was removed with a time of 56 minutes and the patient defibrillated into a sinus rhythm.  A partial occlusion clamp was placed on the aortic root and the three proximal vein graft anastomoses were performed in an end-to-side manner using continuous 6-0 Prolene suture.  The clamp was removed, the vein grafts deaired, and the clamps removed from them.  The proximal and distal anastomosis appeared hemostatic and aligned the graft satisfactory.  Graft markers were placed around the proximal anastomosis.  Two temporary right ventricular and right atrial pacing wires were placed and brought out through the skin.  When the patient had rewarmed to 37 degrees centigrade, he was weaned from cardiopulmonary bypass on no inotropic agents.  Total bypass time was 106 minutes.  Cardiac function appeared excellent with a cardiac output of 8 L/min.  Protamine was given and the venous and aortic cannulas were removed without difficulty.  Hemostasis was achieved.  Three chest tubes were placed with a tube in the posterior pericardium, one in the left pleural space, and one in the anterior mediastinum.  The pericardium was loosely reapproximated over the heart.  The sternum was closed with #6 stainless steel wire.  The fascia was closed with continuous #1 Vicryl suture.  The subcutaneous tissue was closed with continuous 2-0 Vicryl and the skin with 3-0 Vicryl subcuticular closure.  The lower extremity vein harvest site was closed in layers in a similar manner.  The sponge, needle, and instrument counts were correct according to the scrub nurse.  Dry  sterile dressings were applied over the incisions and around the chest tubes which were hooked to Pleuravac suction.  The patient remained hemodynamically stable and was transported to the SICU in guarded, but stable condition. Dictated by:   Alleen Borne, M.D. Attending Physician:  Cleatrice Burke DD:  10/28/01 TD:  10/31/01 Job: [id number]

## 2010-10-10 NOTE — Discharge Summary (Signed)
[place]. [facility]  Patient:    [name], [name]                      MRN: [id number] Adm. Date:  [id number] Disc. Date: [id number] Attending:  Mirian Mo Dictator:   Felton Clinton, M.D. CC:         [facility]  Rollene Rotunda, M.D. [facility]   Discharge Summary  DISCHARGE DIAGNOSES: 1. Coronary artery disease, status post cardiac catheterization. 2. Diabetes mellitus. 3. Hypertension. 4. Hyperlipidemia. 5. Obesity. 6. Tobacco abuse, 60-pack-year history.  The patient reports quitting three    weeks ago.  DISCHARGE MEDICATIONS: 1. Avandia 4 mg one p.o. q.d. 2. Actos 30 mg one p.o. q.d. 3. Gemfibrozil 600 mg one p.o. b.i.d. 4. Insulin 70/30 45 units subcu in a.m. and p.m. 5. Aspirin one p.o. q.d. 6. Altace 10 mg one p.o. q.d. 7. Toprol XL 50 mg one p.o. q.d. 8. Protonix 40 mg one p.o. q.d.  FOLLOW-UP:  [name] has a follow-up appointment with Dr. Antoine Poche in the [facility] office on June 12, at 1:15 p.m.  He is also scheduled for an exercise Cardiolite test at the [facility] in [place] on Wednesday, June 5, at 9:30 p.m.  He was advised to follow up with Dr. Ewing Schlein, his gastroenterologist, if the chest pain continues or worsens, as this may be secondary to a GI problem.  PROCEDURE: 1. Oct 12, 2000, cardiac catheterization.  The LAD had a 70% proximal and    70% mid stenosis.  The obtuse marginal had a 40% proximal stenosis.  A    mid 40% narrowing was noted in the RCA.  Ejection fraction was    approximately 60%.  HISTORY OF PRESENT ILLNESS:  [name] is a 77 year old white male with a history of coronary artery disease, hypertension, and diabetes, who was transferred to the emergency department at [facility] from his primary care physicians office secondary to chest pain.  The chest pain started earlier on the day of admission while the patient was at rest.  It was substernal, 10/10 in  intensity, radiated to the middle of his neck, and was accompanied by diaphoresis, but no nausea and vomiting.  The patient stated that he felt like he needed to "belch."  The pain had been constant since its onset and was not relieved by nitroglycerin given at his primary care physicians office.  LABORATORY DATA:  White blood cell count 6.7, hemoglobin 14.6, platelets 231, PT 12.2, INR 1.0, PTT 30, sodium 132, potassium 4.3, chloride 101, bicarb 25, BUN 20, creatinine 1.2, glucose 387, total bilirubin 1.0, alkaline phosphatase 81, AST 19, ALT 20, total protein 7.0, albumin 3.3.  Cardiac enzymes; CK 119, MB 1.8, index 1.5, troponin 0.02.  EKG showed normal sinus rhythm with a rate of 65.  Biphasic T waves were noted in leads 2, 3, aVF, and V6.  Chest x-ray showed no active disease.  HOSPITAL COURSE:  [name] was admitted to the TCU and ruled out for myocardial infarction with serial enzymes and a follow-up EKG which showed no ischemic changes.  He was continued on his home medications of Toprol, Altace, Gemfibrozil, and aspirin.  In addition, an IV nitroglycerin drip was started for pain.  He was also started on Integrilin as he continued to complain of chest pain during the first several hours of admission.  He was taken to the catheterization lab by Bruce R. Juanda Chance, M.D. [facility] on  the morning of May 21, and as indicated above, was found to have nonocclusive coronary artery disease with a normal ejection fraction.  His chest discomfort was thought to have a possible GI etiology.  He was advised to follow up with his gastroenterologist, Dr. Ewing Schlein, as needed on an outpatient basis.  He was also discharged on Protonix 40 mg p.o. q.d.  He will follow up with Dr. Antoine Poche in the [facility], and will have a Cardiolite stress test as an outpatient prior to this visit.  In regards to his diabetes mellitus, he was continued on his home medications of Avandia, Actos, and 70/30 Insulin, and  remained stable throughout his hospitalization.  DISCHARGE LABORATORY DATA:  White blood cell count 8.2, hemoglobin 12.9, platelets 235.  Sodium 137, potassium 4.3, chloride 106, bicarb 26, BUN 23, creatinine 1.2, platelets 218.  PT 13.9, INR 1.1, PTT 99.  Serial troponins 0.02 and 0.02. DD:  10/13/00 TD:  10/13/00 Job: [id number]

## 2010-10-10 NOTE — H&P (Signed)
[facility] [place]  Patient:    [name] Visit Number: [id number] MRN: [id number]          Service Type: Attending:  Illene Labrador. [place], M.D. Dictated by:   Ottie Glazier. Wynona Neat, P.A.-C. Adm. Date:  10/12/01                           History and Physical  DATE OF BIRTH:  10-03-1933  SOCIAL SECURITY #:  [id number]  CHIEF COMPLAINT:  Back pain.  HISTORY OF PRESENT ILLNESS:  The patient is a 77 year old male with a long history of low back pain secondary to spinal stenosis L3-L4 as well as L5-S1. The patient has undergone conservative therapy, however, he persists to have increasing low back pain.  Therefore, he has opted to undergo surgical intervention.  The risks and benefits have been discussed with the patient prior to entering the operating suite.  ALLERGIES:  No known drug allergies.  MEDICATIONS: 1. Gemfibrozil 600 mg q.d. 2. Protonix 40 mg q.d. 3. Avandia 4 mg q.d. 4. Altace 10 mg q.d. 5. Toprol XL 50 mg. 6. Lipitor 20 mg. 7. Insulin.  Please verify his home medications doses as they are not available to me at this time.  PAST MEDICAL HISTORY: 1. Hyperlipidemia. 2. Coronary artery disease. 3. Insulin-dependent diabetes mellitus. 4. Hypertension.  PAST SURGICAL HISTORY: 1. Appendectomy. 2. Previous lumbar surgery. 3. "Balloon angioplasty" for coronary artery disease. 4. Colon polypectomy.  SOCIAL HISTORY:  The patient is married.  He has eight children, seven living. Denies any alcohol or tobacco use.  FAMILY HISTORY:  Dr. Moshe Cipro.  CARDIOLOGIST:  Dr. Juanda Chance.  FAMILY HISTORY:  "Heart trouble."  Otherwise, noncontributory.  REVIEW OF SYSTEMS:  GENERAL:  Denies any recent weight loss, fever, chills, night sweats.  HEENT:  Denies any chronic headaches, ringing of the ears, runny nose, persistent sore throat, thyroid problems.  CHEST:  He states he has an occasional cough consistent with seasonal allergies.  Otherwise,  no hemoptysis or productive cough.  CARDIOVASCULAR:  The patient does have a history of coronary artery disease with balloon angioplasty and states he occasionally has some chest pains that are short-lived. GASTROINTESTINAL/GENITOURINARY:  Denies any history of chronic diarrhea, constipation, melena, bright red stools per rectum.  EXTREMITIES:  Please see HPI.  NEUROLOGIC:  Denies any history of seizures or strokes.  PHYSICAL EXAMINATION:  VITAL SIGNS:  Blood pressure 120/70, pulse 66, respirations 18.  GENERAL:  Pleasant 77 year old white male appearing his stated age.  In no acute distress.  HEENT:  Head atraumatic, normocephalic.  NECK:  Supple.  Without masses or carotid bruits.  CHEST:  Clear to auscultation bilaterally.  No wheezing, no rhonchi.  HEART:  Regular rate and rhythm.  S1, S2.  ABDOMEN:  Soft and nontender.  Bowel sounds are positive x 4 quadrants.  GENITOURINARY:  Deferred, not pertinent to present illness.  EXTREMITIES:  Examination of the back reveals that he is able to heel/toe walk.  He has ______ extension at the waist with flexion limited at the fingertips to his knees.  He has a negative SLR and ______ test bilaterally. Motor strength, sensation, circular tests are normal in both lower extremities.  Knee reflexes are trace.  Ankle reflexes 1/2+ on the right, 0 on the left, which was ______ .  He had an L5-S1 disk on the left previously.  LABORATORY DATA:  Radiographs demonstrate significant lumbar spondylosis at every level with large osteophytes  present in almost a dish-type picture in the upper lumbar spine.  Myelogram shows that he has a good bit of osteophytic spurring, and this was felt to ______ consistent with dish syndrome.  There was no appreciate nerve root cutoff, but he does have symmetric defects bilaterally at L3 and L4 on the AP projection and the lateral projection posterior to L3-4.  IMPRESSION: 1. Spinal stenosis L3-4, L5-S1, with  previous lumbar surgery at L5-S1. 2. History of insulin-dependent diabetes mellitus. 3. Coronary artery disease. 4. Hypertension. 5. Hyperlipidemia.  PLAN:  The patient will be admitted to [facility] to undergo ______ compression laminectomy L3-L4 and L5-S1 per Dr. Simonne Come, with Dr. Noel Gerold assisting on Oct 12, 2001, at 1:15 p.m. Dictated by:   Ottie Glazier. Wynona Neat, P.A.-C. Attending:  Illene Labrador. Aplington, M.D. DD:  10/11/01 TD:  10/12/01 Job: [id number]

## 2010-12-02 ENCOUNTER — Encounter: Payer: Self-pay | Admitting: Cardiology

## 2010-12-03 ENCOUNTER — Ambulatory Visit (INDEPENDENT_AMBULATORY_CARE_PROVIDER_SITE_OTHER): Payer: 59 | Admitting: Cardiology

## 2010-12-03 ENCOUNTER — Encounter: Payer: Self-pay | Admitting: Cardiology

## 2010-12-03 DIAGNOSIS — I2510 Atherosclerotic heart disease of native coronary artery without angina pectoris: Secondary | ICD-10-CM

## 2010-12-03 DIAGNOSIS — I10 Essential (primary) hypertension: Secondary | ICD-10-CM

## 2010-12-03 DIAGNOSIS — E785 Hyperlipidemia, unspecified: Secondary | ICD-10-CM

## 2010-12-03 NOTE — Progress Notes (Signed)
HPI The patient presents for followup of his known coronary disease. Since I last saw him he does have some increased dyspnea with exertion. He does get down to his garden and back up and will get dyspneic walking a slight distance up an incline. This has been slowly progressive. He does have some right-sided chest discomfort that is somewhat atypical but is not reported substernal chest pressure, neck or arm discomfort. He is not noticing any palpitations and he has had no presyncope or syncope. He has had some increased lower extremity swelling not responsive to diuretics. He's also had some discomfort slightly over his right anterior tibial. He is limited somewhat by back and joint pains.  No Known Allergies  Current Outpatient Prescriptions  Medication Sig Dispense Refill  . ALPRAZolam (XANAX) 0.5 MG tablet Take 0.5 mg by mouth as needed.        Marland Kitchen aspirin 81 MG tablet Take 81 mg by mouth daily.        Marland Kitchen atorvastatin (LIPITOR) 40 MG tablet Take 40 mg by mouth daily.        . hydrocodone-acetaminophen (LORCET PLUS) 7.5-650 MG per tablet Take 1 tablet by mouth as needed.        Marland Kitchen lisinopril (PRINIVIL,ZESTRIL) 10 MG tablet Take 10 mg by mouth daily.        Marland Kitchen omeprazole (PRILOSEC) 20 MG capsule Take 20 mg by mouth daily.        . pioglitazone (ACTOS) 45 MG tablet Take 45 mg by mouth. 1/2 po daily        Past Medical History  Diagnosis Date  . Hypertension   . Hyperlipidemia   . Diabetes mellitus, type II, insulin dependent   . CAD (coronary artery disease)     s/p CABG  . Morbid obesity   . GERD (gastroesophageal reflux disease)   . History of tobacco abuse     Remote  . Back pain   . Hard of hearing     Past Surgical History  Procedure Date  . Appendectomy   . Lumbar disc surgery     x2  . Coronary artery bypass graft     LAD diffuse luminal irregularities with focal proximal 70% stenosis, long mid 60% stenosis. Circumflex had large intermediate proximal and mid 50% stenosis. RCA  had long 70% stenosis before in RV branch and 99% stenosis before on acute marginal with TIMI I flow. EF was SVG to diagonal branch, SVG to seqeuntial obtuse marginal and distal circumflex and SVG to RCA  . Angioplasty   . Colonoscopy w/ polypectomy     ROS:  As stated in the HPI and negative for all other systems.  PHYSICAL EXAM BP 144/88  Pulse 60  Resp 16  Ht 6\' 2"  (1.88 m)  Wt 266 lb (120.657 kg)  BMI 34.15 kg/m2 GENERAL:  Well appearing HEENT:  Pupils equal round and reactive, fundi not visualized, oral mucosa unremarkable, poor dentition NECK:  No jugular venous distention, waveform within normal limits, carotid upstroke brisk and symmetric, no bruits, no thyromegaly LYMPHATICS:  No cervical, inguinal adenopathy LUNGS:  Clear to auscultation bilaterally BACK:  No CVA tenderness CHEST:  Well healed sternotomy scar. HEART:  PMI not displaced or sustained,S1 and S2 within normal limits, no S3, no S4, no clicks, no rubs, no murmurs ABD:  Flat, positive bowel sounds normal in frequency in pitch, no bruits, no rebound, no guarding, no midline pulsatile mass, no hepatomegaly, no splenomegaly, obese EXT:  2 plus pulses  throughout, moderate edema, no cyanosis no clubbing SKIN:  No rashes no nodules NEURO:  Cranial nerves II through XII grossly intact, motor grossly intact throughout PSYCH:  Cognitively intact, oriented to person place and time   EKG:  Sinus rhythm, rate 69, axis within normal limits, intervals within normal limits, no acute ST-T wave changes.   ASSESSMENT AND PLAN

## 2010-12-03 NOTE — Assessment & Plan Note (Signed)
The patient understands the need to lose weight with diet and exercise. We have discussed specific strategies for this.  

## 2010-12-03 NOTE — Assessment & Plan Note (Signed)
His BP is just slightly above target. I will continue the medications as listed but encouraged weight loss to reach target.

## 2010-12-03 NOTE — Patient Instructions (Signed)
You are being scheduled for a Lexiscan Myoview.  Please follow the instruction sheet given.  You will be called with the results. Please have a fasting lipid panel drawn the same day as your stress test.  Do Not eat or drink after midnight the night before your test. Continue current medications as listed.

## 2010-12-03 NOTE — Assessment & Plan Note (Signed)
I will check a lipid profile when he presents for the stress test.

## 2010-12-03 NOTE — Assessment & Plan Note (Signed)
He will continue with risk reduction. It has been many years since his bypass and he needs a stress test. He would not be able to walk this so we will get Lexiscan Myoview.

## 2010-12-09 ENCOUNTER — Encounter: Payer: Self-pay | Admitting: *Deleted

## 2010-12-10 ENCOUNTER — Ambulatory Visit (HOSPITAL_COMMUNITY): Payer: 59 | Attending: Cardiology | Admitting: Radiology

## 2010-12-10 ENCOUNTER — Other Ambulatory Visit (INDEPENDENT_AMBULATORY_CARE_PROVIDER_SITE_OTHER): Payer: 59 | Admitting: *Deleted

## 2010-12-10 VITALS — Ht 74.0 in | Wt 258.0 lb

## 2010-12-10 DIAGNOSIS — I2510 Atherosclerotic heart disease of native coronary artery without angina pectoris: Secondary | ICD-10-CM | POA: Insufficient documentation

## 2010-12-10 DIAGNOSIS — I25810 Atherosclerosis of coronary artery bypass graft(s) without angina pectoris: Secondary | ICD-10-CM

## 2010-12-10 DIAGNOSIS — I491 Atrial premature depolarization: Secondary | ICD-10-CM

## 2010-12-10 DIAGNOSIS — E785 Hyperlipidemia, unspecified: Secondary | ICD-10-CM

## 2010-12-10 LAB — LIPID PANEL
Cholesterol: 174 mg/dL (ref 0–200)
LDL Cholesterol: 108 mg/dL — ABNORMAL HIGH (ref 0–99)

## 2010-12-10 MED ORDER — REGADENOSON 0.4 MG/5ML IV SOLN
0.4000 mg | Freq: Once | INTRAVENOUS | Status: AC
  Administered 2010-12-10: 0.4 mg via INTRAVENOUS

## 2010-12-10 MED ORDER — TECHNETIUM TC 99M TETROFOSMIN IV KIT
33.0000 | PACK | Freq: Once | INTRAVENOUS | Status: AC | PRN
  Administered 2010-12-10: 33 via INTRAVENOUS

## 2010-12-10 MED ORDER — TECHNETIUM TC 99M TETROFOSMIN IV KIT
11.0000 | PACK | Freq: Once | INTRAVENOUS | Status: AC | PRN
  Administered 2010-12-10: 11 via INTRAVENOUS

## 2010-12-10 NOTE — Progress Notes (Addendum)
[facility] SITE 3 NUCLEAR MED [address] [place] [address] [phone]  Cardiology Nuclear Med Study  [name] is a 77 y.o. male [id number] July 10, 1933   Nuclear Med Background Indication for Stress Test:  Evaluation for Ischemia and Graft Patency History:  '03 CABG; '08 ION:GEXBMW, EF=57% Cardiac Risk Factors: History of Smoking, Hypertension, IDDM Type 2, Lipids and Obesity  Symptoms:  Chest Pain (last episode of chest discomfort was last week) and DOE   Nuclear Pre-Procedure Caffeine/Decaff Intake:  None NPO After: 5:00pm   Lungs:  Clear.  O2 Sat 98% on RA. IV 0.9% NS with Angio Cath:  22g  IV Site: R Antecubital  IV Started by:  Irean Hong, RN  Chest Size (in):  52 Cup Size: n/a  Height: 6\' 2"  (1.88 m)  Weight:  258 lb (117.028 kg)  BMI:  Body mass index is 33.13 kg/(m^2). Tech Comments:  n/a    Nuclear Med Study 1 or 2 day study: 1 day  Stress Test Type:  Lexiscan  Reading MD: Marca Ancona, MD  Order Authorizing Provider:  Rollene Rotunda, MD  Resting Radionuclide: Technetium 2m Tetrofosmin  Resting Radionuclide Dose: 11.0 mCi   Stress Radionuclide:  Technetium 63m Tetrofosmin  Stress Radionuclide Dose: 33.0 mCi           Stress Protocol Rest HR: 69 Stress HR: 96  Rest BP: 190/89 Stress BP: 193/87  Exercise Time (min): n/a METS: n/a   Predicted Max HR: 146 bpm % Max HR: 65.75 bpm Rate Pressure Product: 41324   Dose of Adenosine (mg):  n/a Dose of Lexiscan: 0.4 mg  Dose of Atropine (mg): n/a Dose of Dobutamine: n/a mcg/kg/min (at max HR)  Stress Test Technologist: Smiley Houseman, CMA-N  Nuclear Technologist:  Domenic Polite, CNMT     Rest Procedure:  Myocardial perfusion imaging was performed at rest 45 minutes following the intravenous administration of Technetium 100m Tetrofosmin.  Rest ECG: No acute changes; occasional PAC's.  Stress Procedure:  The patient received IV Lexiscan 0.4 mg over 15-seconds.  Technetium  43m Tetrofosmin injected at 30-seconds.  There were nonspecific T-wave changes and occasional PAC's with Lexiscan.  Quantitative spect images were obtained after a 45 minute delay.  Stress ECG: No significant change from baseline ECG  QPS Raw Data Images:  Normal; no motion artifact; normal heart/lung ratio. Stress Images:  Mild basal to mid inferior perfusion defect.  Rest Images:  Normal homogeneous uptake in all areas of the myocardium. Subtraction (SDS):  Mild basal to mid inferior perfusion defect.  Transient Ischemic Dilatation (Normal <1.22):  1.10 Lung/Heart Ratio (Normal <0.45):  0.34  Quantitative Gated Spect Images QGS EDV:  104 ml QGS ESV:  41 ml QGS cine images:  Normal Wall Motion QGS EF: 61%  Impression Exercise Capacity:  Lexiscan with no exercise. BP Response:  Hypertensive blood pressure response. Clinical Symptoms:  Short of breath.  ECG Impression:  No significant ST segment change suggestive of ischemia. PACs.  Comparison with Prior Nuclear Study: Compared to prior, there is a more prominent inferior defect with stress.   Overall Impression:  Low risk stress nuclear study.  Mild reversible basal to mid inferior perfusion defect.  This may represent ischemia (versus attenuation).  Normal EF.   Marca Ancona  Low risk study.  No further cardiovascular studies.  Continue with risk reduction.  Rollene Rotunda

## 2010-12-11 NOTE — Progress Notes (Signed)
Pt's wife aware of results and will tell pt as he is very hard of hearing!

## 2010-12-11 NOTE — Progress Notes (Signed)
nuc med report routed to Dr. Hochrein 12/11/10 [name]  

## 2010-12-18 ENCOUNTER — Other Ambulatory Visit: Payer: Self-pay | Admitting: *Deleted

## 2010-12-18 MED ORDER — ATORVASTATIN CALCIUM 40 MG PO TABS: ORAL_TABLET | ORAL | Status: DC

## 2010-12-18 MED ORDER — ATORVASTATIN CALCIUM 20 MG PO TABS: ORAL_TABLET | ORAL | Status: DC

## 2011-02-03 ENCOUNTER — Other Ambulatory Visit: Payer: Self-pay | Admitting: *Deleted

## 2011-02-03 DIAGNOSIS — E785 Hyperlipidemia, unspecified: Secondary | ICD-10-CM

## 2011-02-03 MED ORDER — ATORVASTATIN CALCIUM 40 MG PO TABS: ORAL_TABLET | ORAL | Status: DC

## 2011-02-03 MED ORDER — ATORVASTATIN CALCIUM 20 MG PO TABS: 20.0000 mg | ORAL_TABLET | Freq: Every day | ORAL | Status: DC

## 2011-07-16 ENCOUNTER — Other Ambulatory Visit (HOSPITAL_COMMUNITY): Payer: Self-pay | Admitting: Orthopaedic Surgery

## 2011-07-16 DIAGNOSIS — M545 Low back pain: Secondary | ICD-10-CM

## 2011-07-21 ENCOUNTER — Other Ambulatory Visit (HOSPITAL_COMMUNITY): Payer: Self-pay | Admitting: Orthopaedic Surgery

## 2011-07-21 ENCOUNTER — Ambulatory Visit (HOSPITAL_COMMUNITY)
Admission: RE | Admit: 2011-07-21 | Discharge: 2011-07-21 | Disposition: A | Discharge status: Home / Self Care | Payer: Medicare HMO | Source: Ambulatory Visit | Attending: Orthopaedic Surgery | Admitting: Orthopaedic Surgery

## 2011-07-21 DIAGNOSIS — M5137 Other intervertebral disc degeneration, lumbosacral region: Secondary | ICD-10-CM | POA: Insufficient documentation

## 2011-07-21 DIAGNOSIS — M545 Low back pain, unspecified: Secondary | ICD-10-CM | POA: Insufficient documentation

## 2011-07-21 DIAGNOSIS — M79609 Pain in unspecified limb: Secondary | ICD-10-CM | POA: Insufficient documentation

## 2011-07-21 DIAGNOSIS — M51379 Other intervertebral disc degeneration, lumbosacral region without mention of lumbar back pain or lower extremity pain: Secondary | ICD-10-CM | POA: Insufficient documentation

## 2011-07-21 DIAGNOSIS — M5126 Other intervertebral disc displacement, lumbar region: Secondary | ICD-10-CM | POA: Insufficient documentation

## 2011-07-22 ENCOUNTER — Other Ambulatory Visit (HOSPITAL_COMMUNITY): Payer: 59

## 2012-02-03 ENCOUNTER — Encounter: Payer: Self-pay | Admitting: Cardiology

## 2012-02-03 ENCOUNTER — Ambulatory Visit (INDEPENDENT_AMBULATORY_CARE_PROVIDER_SITE_OTHER): Payer: Medicare HMO | Admitting: Cardiology

## 2012-02-03 VITALS — BP 140/80 | HR 70 | Ht 74.0 in | Wt 270.0 lb

## 2012-02-03 DIAGNOSIS — I10 Essential (primary) hypertension: Secondary | ICD-10-CM

## 2012-02-03 DIAGNOSIS — E785 Hyperlipidemia, unspecified: Secondary | ICD-10-CM

## 2012-02-03 DIAGNOSIS — I2510 Atherosclerotic heart disease of native coronary artery without angina pectoris: Secondary | ICD-10-CM

## 2012-02-03 MED ORDER — OMEPRAZOLE 20 MG PO CPDR: 20.0000 mg | DELAYED_RELEASE_CAPSULE | Freq: Two times a day (BID) | ORAL | Status: DC

## 2012-02-03 NOTE — Patient Instructions (Addendum)
Please increase Prilosec 20 mg to twice a day Continue all other medications as listed  Follow up in 1 year with Dr Antoine Poche.  You will receive a letter in the mail 2 months before you are due.  Please call us when you receive this letter to schedule your follow up appointment.

## 2012-02-03 NOTE — Progress Notes (Signed)
HPI The patient presents for followup of his known coronary disease.  Last year he had some dyspnea.  I ordered a stress perfusion study which demonstrated low risk stress nuclear study with a mild reversible basal to mid inferior perfusion defect. Normal EF.  He was managed medically.  Since that time the patient has had a couple of episodes of chest discomfort. He actually points to an upper epigastric area. It has happened twice at night. It somewhat severe. He doesn't describe neck or arm discomfort. He let it go away by itself. He said he is also had a sensation of choking on water. He's never had this complaint before. He cannot bring this on with activity though admittedly he's sedentary. He denies any new shortness of breath, PND or orthopnea. He has no new palpitations, presyncope or syncope.  No Known Allergies  Current Outpatient Prescriptions  Medication Sig Dispense Refill  . aspirin 81 MG tablet Take 81 mg by mouth daily.        . furosemide (LASIX) 20 MG tablet Take 20 mg by mouth daily.      . hydrocodone-acetaminophen (LORCET PLUS) 7.5-650 MG per tablet Take 1 tablet by mouth as needed.        Marland Kitchen lisinopril (PRINIVIL,ZESTRIL) 10 MG tablet Take 10 mg by mouth daily.        Marland Kitchen omeprazole (PRILOSEC) 20 MG capsule Take 20 mg by mouth daily.        . pioglitazone (ACTOS) 45 MG tablet Take 45 mg by mouth. 1/2 po daily      . potassium chloride (MICRO-K) 10 MEQ CR capsule Take 10 mEq by mouth daily.      . Tamsulosin HCl (FLOMAX) 0.4 MG CAPS Take 0.4 mg by mouth daily.        Past Medical History  Diagnosis Date  . Hypertension   . Hyperlipidemia   . Diabetes mellitus, type II, insulin dependent   . CAD (coronary artery disease)     s/p CABG  . Morbid obesity   . GERD (gastroesophageal reflux disease)   . History of tobacco abuse     Remote  . Back pain   . Hard of hearing     Past Surgical History  Procedure Date  . Appendectomy   . Lumbar disc surgery     x2  .  Coronary artery bypass graft     LAD diffuse luminal irregularities with focal proximal 70% stenosis, long mid 60% stenosis. Circumflex had large intermediate proximal and mid 50% stenosis. RCA had long 70% stenosis before in RV branch and 99% stenosis before on acute marginal with TIMI I flow. EF was SVG to diagonal branch, SVG to seqeuntial obtuse marginal and distal circumflex and SVG to RCA  . Angioplasty   . Colonoscopy w/ polypectomy   . Skin cancer excision     ROS:  As stated in the HPI and negative for all other systems.  PHYSICAL EXAM BP 140/80  Pulse 70  Ht 6\' 2"  (1.88 m)  Wt 270 lb (122.471 kg)  BMI 34.67 kg/m2 GENERAL:  Well appearing HEENT:  Pupils equal round and reactive, fundi not visualized, oral mucosa unremarkable, poor dentition NECK:  No jugular venous distention, waveform within normal limits, carotid upstroke brisk and symmetric, no bruits, no thyromegaly LYMPHATICS:  No cervical, inguinal adenopathy LUNGS:  Clear to auscultation bilaterally BACK:  No CVA tenderness CHEST:  Well healed sternotomy scar. HEART:  PMI not displaced or sustained,S1 and S2 within  normal limits, no S3, no S4, no clicks, no rubs, no murmurs ABD:  Flat, positive bowel sounds normal in frequency in pitch, no bruits, no rebound, no guarding, no midline pulsatile mass, no hepatomegaly, no splenomegaly, obese EXT:  2 plus pulses throughout, moderate edema, no cyanosis no clubbing SKIN:  No rashes no nodules NEURO:  Cranial nerves II through XII grossly intact, motor grossly intact throughout PSYCH:  Cognitively intact, oriented to person place and time   EKG:  Sinus rhythm, rate 70, axis within normal limits, intervals within normal limits, no acute ST-T wave changes, premature ectopic complexes.  02/03/2012   ASSESSMENT AND PLAN   CAD -  His current symptoms sound more like a gastrointestinal etiology. He had a negative or low risk stress perfusion study last year. No further  cardiovascular testing is suggested. He needs risk reduction.  I have asked him to double his Prilosec and to follow with Dr.  Lysbeth Galas  HYPERTENSION -  His blood pressure is borderline but below target. No change in therapy is indicated.  OBESITY, MORBID - The patient understands the need to lose weight with diet and exercise. We have discussed specific strategies for this.   HYPERLIPIDEMIA -  This is followed by Dr. Lysbeth Galas with a goal LDL less than 70 and HDL greater than 40.

## 2012-07-04 ENCOUNTER — Encounter: Payer: Self-pay | Admitting: Physician Assistant

## 2012-07-04 DIAGNOSIS — R079 Chest pain, unspecified: Secondary | ICD-10-CM

## 2012-07-11 ENCOUNTER — Encounter: Payer: Self-pay | Admitting: Physician Assistant

## 2012-07-11 DIAGNOSIS — R079 Chest pain, unspecified: Secondary | ICD-10-CM

## 2012-07-22 ENCOUNTER — Ambulatory Visit (INDEPENDENT_AMBULATORY_CARE_PROVIDER_SITE_OTHER): Payer: Medicare HMO | Admitting: Physician Assistant

## 2012-07-22 ENCOUNTER — Encounter: Payer: Self-pay | Admitting: Physician Assistant

## 2012-07-22 VITALS — BP 144/76 | HR 70 | Ht 72.0 in | Wt 242.0 lb

## 2012-07-22 DIAGNOSIS — E785 Hyperlipidemia, unspecified: Secondary | ICD-10-CM

## 2012-07-22 DIAGNOSIS — I10 Essential (primary) hypertension: Secondary | ICD-10-CM

## 2012-07-22 DIAGNOSIS — R072 Precordial pain: Secondary | ICD-10-CM

## 2012-07-22 DIAGNOSIS — E782 Mixed hyperlipidemia: Secondary | ICD-10-CM

## 2012-07-22 DIAGNOSIS — I2510 Atherosclerotic heart disease of native coronary artery without angina pectoris: Secondary | ICD-10-CM

## 2012-07-22 DIAGNOSIS — Z79899 Other long term (current) drug therapy: Secondary | ICD-10-CM

## 2012-07-22 DIAGNOSIS — I77810 Thoracic aortic ectasia: Secondary | ICD-10-CM | POA: Insufficient documentation

## 2012-07-22 NOTE — Assessment & Plan Note (Signed)
Stable on current medication regimen 

## 2012-07-22 NOTE — Assessment & Plan Note (Signed)
Will reassess lipid status with a FLP. Continue current dose of statin, pending review of results.

## 2012-07-22 NOTE — Progress Notes (Signed)
Primary Cardiologist: Rollene Rotunda, MD   HPI: Post hospital followup from [name], status post presentation with atypical CP. NL cardiac markers. Baseline echocardiogram, as follows:   - February 11: EF 55-60%; no WMAs; mild aortic root dilatation  Given the above result, we recommended an outpatient dobutamine stress echocardiogram for risk stratification, results as follows:   -  February 17: Negative dobutamine stress echocardiogram   Patient reports no exertional CP. His salient complaint remains that of LUE numbness, and a cramping sensation in in the palm of his left hand.  No Known Allergies  Current Outpatient Prescriptions  Medication Sig Dispense Refill  . atorvastatin (LIPITOR) 20 MG tablet Take 20 mg by mouth daily.       . furosemide (LASIX) 20 MG tablet Take 20 mg by mouth daily.      . hydrocodone-acetaminophen (LORCET PLUS) 7.5-650 MG per tablet Take 1 tablet by mouth as needed.        Marland Kitchen lisinopril (PRINIVIL,ZESTRIL) 10 MG tablet Take 10 mg by mouth daily.        Marland Kitchen omeprazole (PRILOSEC) 20 MG capsule Take 1 capsule (20 mg total) by mouth 2 (two) times daily.  60 capsule  11  . potassium chloride (MICRO-K) 10 MEQ CR capsule Take 10 mEq by mouth daily.      . Tamsulosin HCl (FLOMAX) 0.4 MG CAPS Take 0.4 mg by mouth daily.      Marland Kitchen aspirin 81 MG tablet Take 81 mg by mouth daily.       No current facility-administered medications for this visit.    Past Medical History  Diagnosis Date  . Hypertension   . Hyperlipidemia   . Diabetes mellitus, type II, insulin dependent   . CAD (coronary artery disease)     s/p CABG  . Morbid obesity   . GERD (gastroesophageal reflux disease)   . History of tobacco abuse     Remote  . Back pain   . Hard of hearing   . Aortic root dilatation     Past Surgical History  Procedure Laterality Date  . Appendectomy    . Lumbar disc surgery      x2  . Coronary artery bypass graft      LAD diffuse luminal irregularities with focal  proximal 70% stenosis, long mid 60% stenosis. Circumflex had large intermediate proximal and mid 50% stenosis. RCA had long 70% stenosis before in RV branch and 99% stenosis before on acute marginal with TIMI I flow. EF was SVG to diagonal branch, SVG to seqeuntial obtuse marginal and distal circumflex and SVG to RCA  . Angioplasty    . Colonoscopy w/ polypectomy    . Skin cancer excision      History   Social History  . Marital Status: Married    Spouse Name: N/A    Number of Children: N/A  . Years of Education: N/A   Occupational History  . Retired from [employer] works    Social History Main Topics  . Smoking status: Former Smoker -- 1.00 packs/day for 60 years    Quit date: 05/25/2000  . Smokeless tobacco: Not on file  . Alcohol Use: No  . Drug Use: No  . Sexually Active: Not on file   Other Topics Concern  . Not on file   Social History Narrative   Lives with wife of 52 years   Enjoys watching his great-grandson    Family History  Problem Relation Age of Onset  . Heart disease Father   .  Heart attack Child     ROS: no nausea, vomiting; no fever, chills; no melena, hematochezia; no claudication  PHYSICAL EXAM: BP 144/76  Pulse 70  Ht 6' (1.829 m)  Wt 242 lb (109.77 kg)  BMI 32.81 kg/m2 GENERAL: 79 year-old male, obese; NAD HEENT: NCAT, PERRLA, EOMI; sclera clear; no xanthelasma NECK: palpable bilateral carotid pulses, no bruits; no JVD; no TM LUNGS: CTA bilaterally CARDIAC: RRR (S1, S2); no significant murmurs; no rubs or gallops ABDOMEN: soft, protuberant EXTREMETIES: 1+ bilateral, nonpitting peripheral edema SKIN: warm/dry; no obvious rash/lesions MUSCULOSKELETAL: no joint deformity NEURO: no focal deficit; NL affect   EKG:    ASSESSMENT & PLAN:  CAD No further cardiac workup indicated. Results of recent negative dobutamine stress echocardiogram were reviewed with the patient. Patient recently hospitalized with atypical CP, and NL cardiac markers.  Moreover, he denies any exertional CP. Continue current medication regimen and reassess clinical status in 6 months, with Dr. Antoine Poche in our [place] clinic.  HYPERLIPIDEMIA Will reassess lipid status with a FLP. Continue current dose of statin, pending review of results.  HYPERTENSION Stable on current medication regimen  Aortic root dilatation Assessed as mild, by recent echocardiogram. Will continue to monitor in future with surveillance echocardiograms.    Gene Kasen Sako, PAC

## 2012-07-22 NOTE — Patient Instructions (Signed)
   Labs for fasting lipid and liver panel - Reminder:  Nothing to eat or drink after 12 midnight prior to labs.  Office will contact with results Continue all current medications. Your physician wants you to follow up in: 6 months.  You will receive a reminder letter in the mail one-two months in advance.  If you don't receive a letter, please call our office to schedule the follow up appointment

## 2012-07-22 NOTE — Assessment & Plan Note (Signed)
Assessed as mild, by recent echocardiogram. Will continue to monitor in future with surveillance echocardiograms.

## 2012-07-22 NOTE — Assessment & Plan Note (Signed)
No further cardiac workup indicated. Results of recent negative dobutamine stress echocardiogram were reviewed with the patient. Patient recently hospitalized with atypical CP, and NL cardiac markers. Moreover, he denies any exertional CP. Continue current medication regimen and reassess clinical status in 6 months, with Dr. Antoine Poche in our [place] clinic.

## 2012-08-08 ENCOUNTER — Other Ambulatory Visit: Payer: Self-pay | Admitting: Cardiology

## 2013-01-30 ENCOUNTER — Emergency Department (HOSPITAL_COMMUNITY)
Admission: EM | Admit: 2013-01-30 | Discharge: 2013-01-30 | Disposition: A | Discharge status: Home / Self Care | Payer: Medicare Other | Attending: Emergency Medicine | Admitting: Emergency Medicine

## 2013-01-30 ENCOUNTER — Encounter (HOSPITAL_COMMUNITY): Payer: Self-pay | Admitting: Nurse Practitioner

## 2013-01-30 ENCOUNTER — Emergency Department (HOSPITAL_COMMUNITY): Payer: Medicare Other

## 2013-01-30 DIAGNOSIS — E669 Obesity, unspecified: Secondary | ICD-10-CM | POA: Insufficient documentation

## 2013-01-30 DIAGNOSIS — R109 Unspecified abdominal pain: Secondary | ICD-10-CM

## 2013-01-30 DIAGNOSIS — R300 Dysuria: Secondary | ICD-10-CM | POA: Insufficient documentation

## 2013-01-30 DIAGNOSIS — E785 Hyperlipidemia, unspecified: Secondary | ICD-10-CM | POA: Insufficient documentation

## 2013-01-30 DIAGNOSIS — E119 Type 2 diabetes mellitus without complications: Secondary | ICD-10-CM | POA: Insufficient documentation

## 2013-01-30 DIAGNOSIS — I2510 Atherosclerotic heart disease of native coronary artery without angina pectoris: Secondary | ICD-10-CM | POA: Insufficient documentation

## 2013-01-30 DIAGNOSIS — Z87891 Personal history of nicotine dependence: Secondary | ICD-10-CM | POA: Insufficient documentation

## 2013-01-30 DIAGNOSIS — I10 Essential (primary) hypertension: Secondary | ICD-10-CM | POA: Insufficient documentation

## 2013-01-30 DIAGNOSIS — Z79899 Other long term (current) drug therapy: Secondary | ICD-10-CM | POA: Insufficient documentation

## 2013-01-30 DIAGNOSIS — Z8669 Personal history of other diseases of the nervous system and sense organs: Secondary | ICD-10-CM | POA: Insufficient documentation

## 2013-01-30 DIAGNOSIS — R1031 Right lower quadrant pain: Secondary | ICD-10-CM | POA: Insufficient documentation

## 2013-01-30 DIAGNOSIS — Z794 Long term (current) use of insulin: Secondary | ICD-10-CM | POA: Insufficient documentation

## 2013-01-30 DIAGNOSIS — Z951 Presence of aortocoronary bypass graft: Secondary | ICD-10-CM | POA: Insufficient documentation

## 2013-01-30 DIAGNOSIS — Z7982 Long term (current) use of aspirin: Secondary | ICD-10-CM | POA: Insufficient documentation

## 2013-01-30 LAB — CBC WITH DIFFERENTIAL/PLATELET
Basophils Absolute: 0.1 10*3/uL (ref 0.0–0.1)
Basophils Relative: 1 % (ref 0–1)
Eosinophils Absolute: 0.1 10*3/uL (ref 0.0–0.7)
Eosinophils Relative: 1 % (ref 0–5)
HCT: 35.4 % — ABNORMAL LOW (ref 39.0–52.0)
Hemoglobin: 12.1 g/dL — ABNORMAL LOW (ref 13.0–17.0)
MCH: 30.8 pg (ref 26.0–34.0)
MCHC: 34.2 g/dL (ref 30.0–36.0)
MCV: 90.1 fL (ref 78.0–100.0)
Monocytes Absolute: 0.4 10*3/uL (ref 0.1–1.0)
Monocytes Relative: 6 % (ref 3–12)
RDW: 14.1 % (ref 11.5–15.5)

## 2013-01-30 LAB — COMPREHENSIVE METABOLIC PANEL
AST: 19 U/L (ref 0–37)
Albumin: 3.6 g/dL (ref 3.5–5.2)
BUN: 24 mg/dL — ABNORMAL HIGH (ref 6–23)
Calcium: 9.2 mg/dL (ref 8.4–10.5)
Creatinine, Ser: 2.2 mg/dL — ABNORMAL HIGH (ref 0.50–1.35)
Total Bilirubin: 0.5 mg/dL (ref 0.3–1.2)

## 2013-01-30 LAB — URINALYSIS, ROUTINE W REFLEX MICROSCOPIC
Glucose, UA: 250 mg/dL — AB
Ketones, ur: NEGATIVE mg/dL
Leukocytes, UA: NEGATIVE
Nitrite: NEGATIVE
Protein, ur: 100 mg/dL — AB
pH: 5.5 (ref 5.0–8.0)

## 2013-01-30 LAB — LIPASE, BLOOD: Lipase: 61 U/L — ABNORMAL HIGH (ref 11–59)

## 2013-01-30 LAB — URINE MICROSCOPIC-ADD ON

## 2013-01-30 MED ORDER — SODIUM CHLORIDE 0.9 % IV SOLN
Freq: Once | INTRAVENOUS | Status: AC
  Administered 2013-01-30: 15:00:00 via INTRAVENOUS

## 2013-01-30 MED ORDER — MORPHINE SULFATE 2 MG/ML IJ SOLN
2.0000 mg | Freq: Once | INTRAMUSCULAR | Status: AC
  Administered 2013-01-30: 2 mg via INTRAVENOUS
  Filled 2013-01-30: qty 1

## 2013-01-30 MED ORDER — MORPHINE SULFATE 4 MG/ML IJ SOLN: 4.0000 mg | Freq: Once | INTRAMUSCULAR | Status: DC

## 2013-01-30 NOTE — ED Notes (Signed)
MD Masneri at bedside.  

## 2013-01-30 NOTE — ED Notes (Signed)
Lower rt abd pain that goes across abd and radiates to back x 1 week pt has hx of kidney stone. Wife states that pt had n/v this am and that pt reported that it burns and hurts to void this am

## 2013-01-30 NOTE — ED Notes (Signed)
Pt reports R sided abd and flank pain for 3 days. Wife reports pt could hardly get dressed today he had so much pain. Wife reports pt has been urinating a lot and vomited also

## 2013-01-30 NOTE — ED Provider Notes (Signed)
CSN: [id number]     Arrival date & time 01/30/13  1056 History   First MD Initiated Contact with Patient 01/30/13 1354     Chief Complaint  Patient presents with  . Flank Pain   (Consider location/radiation/quality/duration/timing/severity/associated sxs/prior Treatment) HPI Comments: 79 yo wm presents to ER for R flank pain, RLQ pain, n/v for 5 days.  No f/c, no constipation, no diarrhea.  Pt does have a h/o kidney stones x 2.  PMH c/w DM, HTN, GERD, CABG x 5.    Pt reportedly had his appendix removed.    Patient is a 79 y.o. male presenting with flank pain. The history is provided by the patient and the spouse.  Flank Pain This is a new problem. The current episode started more than 2 days ago. The problem occurs constantly. The problem has been gradually worsening. Associated symptoms include abdominal pain. Pertinent negatives include no chest pain, no headaches and no shortness of breath. The symptoms are aggravated by twisting. Nothing relieves the symptoms. The treatment provided mild (Norco) relief.    Past Medical History  Diagnosis Date  . Hypertension   . Hyperlipidemia   . Diabetes mellitus, type II, insulin dependent   . CAD (coronary artery disease)     s/p CABG  . Morbid obesity   . GERD (gastroesophageal reflux disease)   . History of tobacco abuse     Remote  . Back pain   . Hard of hearing   . Aortic root dilatation    Past Surgical History  Procedure Laterality Date  . Appendectomy    . Lumbar disc surgery      x2  . Coronary artery bypass graft      LAD diffuse luminal irregularities with focal proximal 70% stenosis, long mid 60% stenosis. Circumflex had large intermediate proximal and mid 50% stenosis. RCA had long 70% stenosis before in RV branch and 99% stenosis before on acute marginal with TIMI I flow. EF was SVG to diagonal branch, SVG to seqeuntial obtuse marginal and distal circumflex and SVG to RCA  . Angioplasty    . Colonoscopy w/ polypectomy      . Skin cancer excision     Family History  Problem Relation Age of Onset  . Heart disease Father   . Heart attack Child    History  Substance Use Topics  . Smoking status: Former Smoker -- 1.00 packs/day for 60 years    Quit date: 05/25/2000  . Smokeless tobacco: Not on file  . Alcohol Use: No    Review of Systems  Constitutional: Positive for activity change. Negative for fever, chills, diaphoresis, appetite change, fatigue and unexpected weight change.  HENT: Negative.   Respiratory: Negative for shortness of breath.   Cardiovascular: Negative for chest pain.  Gastrointestinal: Positive for abdominal pain.  Genitourinary: Positive for dysuria and flank pain. Negative for frequency, hematuria, discharge, penile swelling, scrotal swelling, enuresis, difficulty urinating, genital sores and penile pain.  Musculoskeletal: Negative.   Skin: Negative.   Allergic/Immunologic: Negative.   Neurological: Negative.  Negative for dizziness, seizures, facial asymmetry, light-headedness, numbness and headaches.  Hematological: Negative.     Allergies  Review of patient's allergies indicates no known allergies.  Home Medications   Current Outpatient Rx  Name  Route  Sig  Dispense  Refill  . aspirin 81 MG tablet   Oral   Take 81 mg by mouth daily.         Marland Kitchen atorvastatin (LIPITOR) 20 MG tablet  Oral   Take 20 mg by mouth at bedtime.         . furosemide (LASIX) 20 MG tablet   Oral   Take 20 mg by mouth daily.         Marland Kitchen HYDROcodone-acetaminophen (NORCO/VICODIN) 5-325 MG per tablet   Oral   Take 2 tablets by mouth every 6 (six) hours as needed for pain.         Marland Kitchen lisinopril (PRINIVIL,ZESTRIL) 10 MG tablet   Oral   Take 10 mg by mouth daily.           . naproxen sodium (ANAPROX) 220 MG tablet   Oral   Take 440 mg by mouth as needed (pain).         Marland Kitchen omeprazole (PRILOSEC) 20 MG capsule   Oral   Take 1 capsule (20 mg total) by mouth 2 (two) times daily.   60  capsule   11   . pioglitazone (ACTOS) 45 MG tablet   Oral   Take 45 mg by mouth daily.         . potassium chloride (MICRO-K) 10 MEQ CR capsule   Oral   Take 10 mEq by mouth daily.         . Tamsulosin HCl (FLOMAX) 0.4 MG CAPS   Oral   Take 0.4 mg by mouth daily.          BP 160/91  Pulse 65  Temp(Src) 98.6 F (37 C) (Oral)  Resp 18  SpO2 99% Physical Exam  Constitutional: He appears well-developed and well-nourished.  HENT:  Head: Normocephalic and atraumatic.  Eyes: Conjunctivae and EOM are normal. Pupils are equal, round, and reactive to light.  Neck: Normal range of motion. Neck supple.  Cardiovascular: Normal rate, regular rhythm, normal heart sounds and intact distal pulses.   Pulmonary/Chest: Effort normal and breath sounds normal.  Abdominal: Soft. He exhibits no distension and no mass. There is no splenomegaly or hepatomegaly. There is tenderness in the right lower quadrant. There is CVA tenderness. There is no rigidity, no rebound, no guarding and negative Murphy's sign.    Genitourinary: Prostate normal and penis normal.  Two test descended, no mass, no ttp, no scrotal edema, no hernia    ED Course  Procedures (including critical care time) Labs Review   Results for orders placed during the hospital encounter of 01/30/13  CBC WITH DIFFERENTIAL      Result Value Range   WBC 7.0  4.0 - 10.5 K/uL   RBC 3.93 (*) 4.22 - 5.81 MIL/uL   Hemoglobin 12.1 (*) 13.0 - 17.0 g/dL   HCT 13.0 (*) 86.5 - 78.4 %   MCV 90.1  78.0 - 100.0 fL   MCH 30.8  26.0 - 34.0 pg   MCHC 34.2  30.0 - 36.0 g/dL   RDW 69.6  29.5 - 28.4 %   Platelets 268  150 - 400 K/uL   Neutrophils Relative % 78 (*) 43 - 77 %   Neutro Abs 5.4  1.7 - 7.7 K/uL   Lymphocytes Relative 15  12 - 46 %   Lymphs Abs 1.0  0.7 - 4.0 K/uL   Monocytes Relative 6  3 - 12 %   Monocytes Absolute 0.4  0.1 - 1.0 K/uL   Eosinophils Relative 1  0 - 5 %   Eosinophils Absolute 0.1  0.0 - 0.7 K/uL   Basophils  Relative 1  0 - 1 %   Basophils Absolute 0.1  0.0 - 0.1 K/uL  COMPREHENSIVE METABOLIC PANEL      Result Value Range   Sodium 134 (*) 135 - 145 mEq/L   Potassium 4.4  3.5 - 5.1 mEq/L   Chloride 101  96 - 112 mEq/L   CO2 21  19 - 32 mEq/L   Glucose, Bld 174 (*) 70 - 99 mg/dL   BUN 24 (*) 6 - 23 mg/dL   Creatinine, Ser 1.47 (*) 0.50 - 1.35 mg/dL   Calcium 9.2  8.4 - 82.9 mg/dL   Total Protein 7.3  6.0 - 8.3 g/dL   Albumin 3.6  3.5 - 5.2 g/dL   AST 19  0 - 37 U/L   ALT 9  0 - 53 U/L   Alkaline Phosphatase 68  39 - 117 U/L   Total Bilirubin 0.5  0.3 - 1.2 mg/dL   GFR calc non Af Amer 27 (*) >90 mL/min   GFR calc Af Amer 31 (*) >90 mL/min  LIPASE, BLOOD      Result Value Range   Lipase 61 (*) 11 - 59 U/L  URINALYSIS, ROUTINE W REFLEX MICROSCOPIC      Result Value Range   Color, Urine YELLOW  YELLOW   APPearance CLEAR  CLEAR   Specific Gravity, Urine 1.021  1.005 - 1.030   pH 5.5  5.0 - 8.0   Glucose, UA 250 (*) NEGATIVE mg/dL   Hgb urine dipstick NEGATIVE  NEGATIVE   Bilirubin Urine NEGATIVE  NEGATIVE   Ketones, ur NEGATIVE  NEGATIVE mg/dL   Protein, ur 562 (*) NEGATIVE mg/dL   Urobilinogen, UA 1.0  0.0 - 1.0 mg/dL   Nitrite NEGATIVE  NEGATIVE   Leukocytes, UA NEGATIVE  NEGATIVE  URINE MICROSCOPIC-ADD ON      Result Value Range   Squamous Epithelial / LPF RARE  RARE   WBC, UA 0-2  <3 WBC/hpf   RBC / HPF 0-2  <3 RBC/hpf   Bacteria, UA RARE  RARE    Imaging Review No results found.  MDM   1. Right flank pain    79 yo wm presents to the ER with a chief complaint of right flank and right lower quadrant.  Patient denies fever, chills, chest pain, shortness of breath, frequency, urgency, hematuria. Patient does endorse dysuria patient have a prolonged ER stay awaiting evaluation with CAT scan. Results revealed the following unremarkable urinalysis, normal serum white blood cell count.  CT without findings of right sided pathology.   Patient does have a history of the stable 9  mm hyperdense structure in the right midpole of the kidney he also has a 4 mm stone in the left kidney. There is no evidence of acute pathology of the gallbladder or appendix.  Aorta without evidence of aneurism, atherosclerosis was noted.      AFVSS, uneventful ER stay.  Pt given pain medications with good relief.  Doubt AAA, fracture, stone, pyelo or UTI, surgical abdomen, serious bacterial infection or other emergent etiology.  Pt has a h/o similar pain for which he has been evaluated.  I had a lengthy d/w pt, his wife, and son regarding the evaluation in the ER and plan for outpatient follow-up.  All agreed with the plan.  ER precautions were given.  Pt has MMP and comorbidities which obligate close outpatient follow up.  He is able to follow up in a couple days with his PCM.    The patient appears reasonably screened and/or stabilized for discharge and I doubt  any other medical condition or other [facility] requiring further screening, evaluation, or treatment in the ED at this time prior to discharge.    Darlys Gales, MD 02/01/13 Ebony Cargo

## 2013-01-30 NOTE — ED Notes (Signed)
Pt dc'd home with all belongings, pt alert and oriented x4, pt verbalizes understanding of dc instructions, pt transported home by spouse at bedside, pt transported out via wheelchair for comfort, pt ambulatory to wheelchair with no assistance

## 2013-04-18 ENCOUNTER — Other Ambulatory Visit: Payer: Self-pay | Admitting: Cardiology

## 2013-05-24 ENCOUNTER — Other Ambulatory Visit: Payer: Self-pay

## 2013-05-24 MED ORDER — ATORVASTATIN CALCIUM 20 MG PO TABS: ORAL_TABLET | ORAL | Status: DC

## 2013-05-24 MED ORDER — OMEPRAZOLE 20 MG PO CPDR: 20.0000 mg | DELAYED_RELEASE_CAPSULE | Freq: Two times a day (BID) | ORAL | Status: DC

## 2013-07-23 ENCOUNTER — Emergency Department (HOSPITAL_COMMUNITY): Payer: Medicare Other

## 2013-07-23 ENCOUNTER — Encounter (HOSPITAL_COMMUNITY): Payer: Self-pay | Admitting: Emergency Medicine

## 2013-07-23 ENCOUNTER — Inpatient Hospital Stay (HOSPITAL_COMMUNITY)
Admission: EM | Admit: 2013-07-23 | Discharge: 2013-07-31 | DRG: 469 | Disposition: A | Discharge status: Skilled Nursing Facility | Payer: Medicare Other | Attending: Family Medicine | Admitting: Family Medicine

## 2013-07-23 DIAGNOSIS — E119 Type 2 diabetes mellitus without complications: Secondary | ICD-10-CM

## 2013-07-23 DIAGNOSIS — N184 Chronic kidney disease, stage 4 (severe): Secondary | ICD-10-CM | POA: Diagnosis present

## 2013-07-23 DIAGNOSIS — N1330 Unspecified hydronephrosis: Secondary | ICD-10-CM

## 2013-07-23 DIAGNOSIS — N170 Acute kidney failure with tubular necrosis: Secondary | ICD-10-CM | POA: Diagnosis not present

## 2013-07-23 DIAGNOSIS — H9190 Unspecified hearing loss, unspecified ear: Secondary | ICD-10-CM | POA: Diagnosis present

## 2013-07-23 DIAGNOSIS — N189 Chronic kidney disease, unspecified: Secondary | ICD-10-CM

## 2013-07-23 DIAGNOSIS — M25512 Pain in left shoulder: Secondary | ICD-10-CM | POA: Diagnosis present

## 2013-07-23 DIAGNOSIS — Y92009 Unspecified place in unspecified non-institutional (private) residence as the place of occurrence of the external cause: Secondary | ICD-10-CM

## 2013-07-23 DIAGNOSIS — Z01818 Encounter for other preprocedural examination: Secondary | ICD-10-CM

## 2013-07-23 DIAGNOSIS — I2510 Atherosclerotic heart disease of native coronary artery without angina pectoris: Secondary | ICD-10-CM

## 2013-07-23 DIAGNOSIS — E785 Hyperlipidemia, unspecified: Secondary | ICD-10-CM | POA: Diagnosis present

## 2013-07-23 DIAGNOSIS — I129 Hypertensive chronic kidney disease with stage 1 through stage 4 chronic kidney disease, or unspecified chronic kidney disease: Secondary | ICD-10-CM | POA: Diagnosis present

## 2013-07-23 DIAGNOSIS — E559 Vitamin D deficiency, unspecified: Secondary | ICD-10-CM | POA: Diagnosis present

## 2013-07-23 DIAGNOSIS — Z8249 Family history of ischemic heart disease and other diseases of the circulatory system: Secondary | ICD-10-CM

## 2013-07-23 DIAGNOSIS — Z01810 Encounter for preprocedural cardiovascular examination: Secondary | ICD-10-CM

## 2013-07-23 DIAGNOSIS — D649 Anemia, unspecified: Secondary | ICD-10-CM

## 2013-07-23 DIAGNOSIS — N2581 Secondary hyperparathyroidism of renal origin: Secondary | ICD-10-CM | POA: Diagnosis present

## 2013-07-23 DIAGNOSIS — S72002A Fracture of unspecified part of neck of left femur, initial encounter for closed fracture: Secondary | ICD-10-CM | POA: Diagnosis present

## 2013-07-23 DIAGNOSIS — D696 Thrombocytopenia, unspecified: Secondary | ICD-10-CM

## 2013-07-23 DIAGNOSIS — D62 Acute posthemorrhagic anemia: Secondary | ICD-10-CM | POA: Diagnosis not present

## 2013-07-23 DIAGNOSIS — Z794 Long term (current) use of insulin: Secondary | ICD-10-CM

## 2013-07-23 DIAGNOSIS — W010XXA Fall on same level from slipping, tripping and stumbling without subsequent striking against object, initial encounter: Secondary | ICD-10-CM | POA: Diagnosis present

## 2013-07-23 DIAGNOSIS — G8929 Other chronic pain: Secondary | ICD-10-CM | POA: Diagnosis present

## 2013-07-23 DIAGNOSIS — R3129 Other microscopic hematuria: Secondary | ICD-10-CM | POA: Diagnosis present

## 2013-07-23 DIAGNOSIS — M62838 Other muscle spasm: Secondary | ICD-10-CM | POA: Diagnosis present

## 2013-07-23 DIAGNOSIS — S40019A Contusion of unspecified shoulder, initial encounter: Secondary | ICD-10-CM | POA: Diagnosis present

## 2013-07-23 DIAGNOSIS — S72009A Fracture of unspecified part of neck of unspecified femur, initial encounter for closed fracture: Principal | ICD-10-CM | POA: Diagnosis present

## 2013-07-23 DIAGNOSIS — D6959 Other secondary thrombocytopenia: Secondary | ICD-10-CM | POA: Diagnosis present

## 2013-07-23 DIAGNOSIS — S40012A Contusion of left shoulder, initial encounter: Secondary | ICD-10-CM

## 2013-07-23 DIAGNOSIS — M47817 Spondylosis without myelopathy or radiculopathy, lumbosacral region: Secondary | ICD-10-CM | POA: Diagnosis present

## 2013-07-23 DIAGNOSIS — Z6834 Body mass index (BMI) 34.0-34.9, adult: Secondary | ICD-10-CM

## 2013-07-23 DIAGNOSIS — N179 Acute kidney failure, unspecified: Secondary | ICD-10-CM

## 2013-07-23 DIAGNOSIS — Z951 Presence of aortocoronary bypass graft: Secondary | ICD-10-CM

## 2013-07-23 DIAGNOSIS — I10 Essential (primary) hypertension: Secondary | ICD-10-CM

## 2013-07-23 DIAGNOSIS — Z87891 Personal history of nicotine dependence: Secondary | ICD-10-CM

## 2013-07-23 DIAGNOSIS — E669 Obesity, unspecified: Secondary | ICD-10-CM | POA: Diagnosis present

## 2013-07-23 DIAGNOSIS — W19XXXA Unspecified fall, initial encounter: Secondary | ICD-10-CM

## 2013-07-23 DIAGNOSIS — K219 Gastro-esophageal reflux disease without esophagitis: Secondary | ICD-10-CM | POA: Diagnosis present

## 2013-07-23 DIAGNOSIS — Z85828 Personal history of other malignant neoplasm of skin: Secondary | ICD-10-CM

## 2013-07-23 DIAGNOSIS — D638 Anemia in other chronic diseases classified elsewhere: Secondary | ICD-10-CM | POA: Diagnosis present

## 2013-07-23 HISTORY — DX: Chronic kidney disease, stage 4 (severe): N18.4

## 2013-07-23 HISTORY — DX: Atherosclerotic heart disease of native coronary artery without angina pectoris: I25.10

## 2013-07-23 LAB — CBC WITH DIFFERENTIAL/PLATELET
Basophils Absolute: 0 10*3/uL (ref 0.0–0.1)
Basophils Relative: 0 % (ref 0–1)
Eosinophils Absolute: 0.1 10*3/uL (ref 0.0–0.7)
Eosinophils Relative: 1 % (ref 0–5)
HCT: 34.2 % — ABNORMAL LOW (ref 39.0–52.0)
Hemoglobin: 11.4 g/dL — ABNORMAL LOW (ref 13.0–17.0)
LYMPHS ABS: 1 10*3/uL (ref 0.7–4.0)
LYMPHS PCT: 9 % — AB (ref 12–46)
MCH: 31.1 pg (ref 26.0–34.0)
MCHC: 33.3 g/dL (ref 30.0–36.0)
MCV: 93.4 fL (ref 78.0–100.0)
Monocytes Absolute: 1.4 10*3/uL — ABNORMAL HIGH (ref 0.1–1.0)
Monocytes Relative: 13 % — ABNORMAL HIGH (ref 3–12)
NEUTROS ABS: 8.4 10*3/uL — AB (ref 1.7–7.7)
NEUTROS PCT: 77 % (ref 43–77)
PLATELETS: 172 10*3/uL (ref 150–400)
RBC: 3.66 MIL/uL — AB (ref 4.22–5.81)
RDW: 13.9 % (ref 11.5–15.5)
WBC: 10.9 10*3/uL — AB (ref 4.0–10.5)

## 2013-07-23 LAB — PROTIME-INR
INR: 1.07 (ref 0.00–1.49)
Prothrombin Time: 13.7 seconds (ref 11.6–15.2)

## 2013-07-23 LAB — URINALYSIS, ROUTINE W REFLEX MICROSCOPIC
Bilirubin Urine: NEGATIVE
GLUCOSE, UA: NEGATIVE mg/dL
Ketones, ur: NEGATIVE mg/dL
LEUKOCYTES UA: NEGATIVE
Nitrite: NEGATIVE
Protein, ur: 30 mg/dL — AB
UROBILINOGEN UA: 0.2 mg/dL (ref 0.0–1.0)
pH: 5.5 (ref 5.0–8.0)

## 2013-07-23 LAB — URINE MICROSCOPIC-ADD ON

## 2013-07-23 LAB — COMPREHENSIVE METABOLIC PANEL
ALK PHOS: 104 U/L (ref 39–117)
ALT: 13 U/L (ref 0–53)
AST: 19 U/L (ref 0–37)
Albumin: 3.3 g/dL — ABNORMAL LOW (ref 3.5–5.2)
BUN: 40 mg/dL — ABNORMAL HIGH (ref 6–23)
CHLORIDE: 99 meq/L (ref 96–112)
CO2: 22 meq/L (ref 19–32)
Calcium: 8.9 mg/dL (ref 8.4–10.5)
Creatinine, Ser: 2.92 mg/dL — ABNORMAL HIGH (ref 0.50–1.35)
GFR, EST AFRICAN AMERICAN: 22 mL/min — AB (ref >90)
GFR, EST NON AFRICAN AMERICAN: 19 mL/min — AB (ref >90)
GLUCOSE: 187 mg/dL — AB (ref 70–99)
POTASSIUM: 4.2 meq/L (ref 3.7–5.3)
SODIUM: 135 meq/L — AB (ref 137–147)
Total Bilirubin: 0.4 mg/dL (ref 0.3–1.2)
Total Protein: 6.9 g/dL (ref 6.0–8.3)

## 2013-07-23 LAB — GLUCOSE, CAPILLARY: Glucose-Capillary: 166 mg/dL — ABNORMAL HIGH (ref 70–99)

## 2013-07-23 LAB — SAMPLE TO BLOOD BANK

## 2013-07-23 LAB — APTT: aPTT: 29 seconds (ref 24–37)

## 2013-07-23 MED ORDER — MORPHINE SULFATE 2 MG/ML IJ SOLN
0.5000 mg | INTRAMUSCULAR | Status: DC | PRN
  Administered 2013-07-23 – 2013-07-25 (×5): 0.5 mg via INTRAVENOUS
  Filled 2013-07-23 (×5): qty 1

## 2013-07-23 MED ORDER — HYDROCODONE-ACETAMINOPHEN 5-325 MG PO TABS
1.0000 | ORAL_TABLET | Freq: Four times a day (QID) | ORAL | Status: DC | PRN
  Administered 2013-07-24 – 2013-07-26 (×3): 1 via ORAL
  Administered 2013-07-27 – 2013-07-29 (×5): 2 via ORAL
  Administered 2013-07-30: 1 via ORAL
  Administered 2013-07-31: 2 via ORAL
  Filled 2013-07-23 (×2): qty 1
  Filled 2013-07-23 (×2): qty 2
  Filled 2013-07-23: qty 1
  Filled 2013-07-23 (×3): qty 2
  Filled 2013-07-23: qty 1
  Filled 2013-07-23: qty 2

## 2013-07-23 MED ORDER — INSULIN DETEMIR 100 UNIT/ML ~~LOC~~ SOLN
25.0000 [IU] | Freq: Every day | SUBCUTANEOUS | Status: DC
  Administered 2013-07-24 – 2013-07-31 (×7): 25 [IU] via SUBCUTANEOUS
  Filled 2013-07-23 (×9): qty 0.25

## 2013-07-23 MED ORDER — ATORVASTATIN CALCIUM 20 MG PO TABS
20.0000 mg | ORAL_TABLET | Freq: Every day | ORAL | Status: DC
  Administered 2013-07-23 – 2013-07-30 (×8): 20 mg via ORAL
  Filled 2013-07-23 (×9): qty 1

## 2013-07-23 MED ORDER — INSULIN ASPART 100 UNIT/ML ~~LOC~~ SOLN
0.0000 [IU] | Freq: Every day | SUBCUTANEOUS | Status: DC
  Administered 2013-07-25: 3 [IU] via SUBCUTANEOUS
  Administered 2013-07-26: 2 [IU] via SUBCUTANEOUS

## 2013-07-23 MED ORDER — ASPIRIN 81 MG PO TABS: 81.0000 mg | ORAL_TABLET | Freq: Every day | ORAL | Status: DC

## 2013-07-23 MED ORDER — ONDANSETRON HCL 4 MG/2ML IJ SOLN
4.0000 mg | Freq: Once | INTRAMUSCULAR | Status: AC
  Administered 2013-07-23: 4 mg via INTRAVENOUS

## 2013-07-23 MED ORDER — ONDANSETRON HCL 4 MG/2ML IJ SOLN
4.0000 mg | Freq: Once | INTRAMUSCULAR | Status: DC
  Filled 2013-07-23: qty 2

## 2013-07-23 MED ORDER — HYDROMORPHONE HCL PF 1 MG/ML IJ SOLN
1.0000 mg | Freq: Once | INTRAMUSCULAR | Status: AC
  Administered 2013-07-23: 1 mg via INTRAVENOUS
  Filled 2013-07-23: qty 1

## 2013-07-23 MED ORDER — CITALOPRAM HYDROBROMIDE 20 MG PO TABS
20.0000 mg | ORAL_TABLET | Freq: Every day | ORAL | Status: DC
  Administered 2013-07-24 – 2013-07-31 (×7): 20 mg via ORAL
  Filled 2013-07-23 (×7): qty 1

## 2013-07-23 MED ORDER — SODIUM CHLORIDE 0.9 % IV SOLN
INTRAVENOUS | Status: AC
  Administered 2013-07-23: 23:00:00 via INTRAVENOUS

## 2013-07-23 MED ORDER — INSULIN ASPART 100 UNIT/ML ~~LOC~~ SOLN
0.0000 [IU] | Freq: Three times a day (TID) | SUBCUTANEOUS | Status: DC
  Administered 2013-07-24: 1 [IU] via SUBCUTANEOUS
  Administered 2013-07-24: 2 [IU] via SUBCUTANEOUS
  Administered 2013-07-24: 1 [IU] via SUBCUTANEOUS
  Administered 2013-07-25: 3 [IU] via SUBCUTANEOUS
  Administered 2013-07-26: 5 [IU] via SUBCUTANEOUS
  Administered 2013-07-26: 2 [IU] via SUBCUTANEOUS
  Administered 2013-07-26: 3 [IU] via SUBCUTANEOUS
  Administered 2013-07-27 (×2): 2 [IU] via SUBCUTANEOUS
  Administered 2013-07-27: 3 [IU] via SUBCUTANEOUS
  Administered 2013-07-28: 1 [IU] via SUBCUTANEOUS
  Administered 2013-07-28 (×2): 2 [IU] via SUBCUTANEOUS
  Administered 2013-07-29 (×2): 1 [IU] via SUBCUTANEOUS
  Administered 2013-07-29: 2 [IU] via SUBCUTANEOUS
  Administered 2013-07-30: 1 [IU] via SUBCUTANEOUS
  Administered 2013-07-30 (×2): 2 [IU] via SUBCUTANEOUS
  Administered 2013-07-31: 1 [IU] via SUBCUTANEOUS

## 2013-07-23 MED ORDER — TAMSULOSIN HCL 0.4 MG PO CAPS
0.4000 mg | ORAL_CAPSULE | Freq: Every day | ORAL | Status: DC
  Administered 2013-07-24 – 2013-07-31 (×7): 0.4 mg via ORAL
  Filled 2013-07-23 (×7): qty 1

## 2013-07-23 MED ORDER — MORPHINE SULFATE 4 MG/ML IJ SOLN
4.0000 mg | Freq: Once | INTRAMUSCULAR | Status: AC
  Administered 2013-07-23: 4 mg via INTRAVENOUS
  Filled 2013-07-23: qty 1

## 2013-07-23 MED ORDER — ASPIRIN 81 MG PO CHEW: 81.0000 mg | CHEWABLE_TABLET | Freq: Every day | ORAL | Status: DC

## 2013-07-23 MED ORDER — PANTOPRAZOLE SODIUM 40 MG PO TBEC
40.0000 mg | DELAYED_RELEASE_TABLET | Freq: Two times a day (BID) | ORAL | Status: DC
  Administered 2013-07-23 – 2013-07-31 (×15): 40 mg via ORAL
  Filled 2013-07-23 (×16): qty 1

## 2013-07-23 MED ORDER — GABAPENTIN 100 MG PO CAPS
100.0000 mg | ORAL_CAPSULE | Freq: Three times a day (TID) | ORAL | Status: DC
  Administered 2013-07-23 – 2013-07-31 (×22): 100 mg via ORAL
  Filled 2013-07-23 (×23): qty 1

## 2013-07-23 NOTE — ED Notes (Signed)
Dr. Krishnan at bedside. 

## 2013-07-23 NOTE — ED Notes (Signed)
Pt slipped in mud fell on lt side, has chronic leg pain, EMS noticed some shortening and external rotation.

## 2013-07-23 NOTE — ED Provider Notes (Signed)
CSN: [id number]     Arrival date & time 07/23/13  1803 History   First MD Initiated Contact with Patient 07/23/13 1818     Chief Complaint  Patient presents with  . Fall     (Consider location/radiation/quality/duration/timing/severity/associated sxs/prior Treatment) HPI Patient was going outside to what dates from his chickens and he slipped and fell in the mud. He states he fell onto his left hip and left shoulder. He did not hit his head or have loss of consciousness. Was unable to get up without several neighbors coming over to assist him. He was unable to stand or bear weight in that leg after he fell. He had hurt his left shoulder a few weeks ago and his are seen orthopedist about his shoulder. He states it was just starting to get better until he fell again tonight. He denies any other injury.  [facility]P [facility]  [facility]  Dr Hilda LiasKeeling  Past Medical History  Diagnosis Date  . Hypertension   . Hyperlipidemia   . Diabetes mellitus, type II, insulin dependent   . CAD (coronary artery disease)     s/p CABG  . Morbid obesity   . GERD (gastroesophageal reflux disease)   . History of tobacco abuse     Remote  . Back pain   . Hard of hearing   . Aortic root dilatation    Past Surgical History  Procedure Laterality Date  . Appendectomy    . Lumbar disc surgery      x2  . Coronary artery bypass graft      LAD diffuse luminal irregularities with focal proximal 70% stenosis, long mid 60% stenosis. Circumflex had large intermediate proximal and mid 50% stenosis. RCA had long 70% stenosis before in RV branch and 99% stenosis before on acute marginal with TIMI I flow. EF was SVG to diagonal branch, SVG to seqeuntial obtuse marginal and distal circumflex and SVG to RCA  . Angioplasty    . Colonoscopy w/ polypectomy    . Skin cancer excision     Family History  Problem Relation Age of Onset  . Heart disease Father   . Heart attack Child    History  Substance Use  Topics  . Smoking status: Former Smoker -- 1.00 packs/day for 60 years    Quit date: 05/25/2000  . Smokeless tobacco: Not on file  . Alcohol Use: No  lives at home Lives with spouse Uses a cane  Review of Systems  All other systems reviewed and are negative.      Allergies  Review of patient's allergies indicates no known allergies.  Home Medications   Current Outpatient Rx  Name  Route  Sig  Dispense  Refill  . aspirin 81 MG tablet   Oral   Take 81 mg by mouth daily.         Marland Kitchen. atorvastatin (LIPITOR) 20 MG tablet   Oral   Take 20 mg by mouth at bedtime.         . citalopram (CELEXA) 20 MG tablet   Oral   Take 20 mg by mouth daily.         . furosemide (LASIX) 20 MG tablet   Oral   Take 20 mg by mouth daily.         Marland Kitchen. gabapentin (NEURONTIN) 100 MG capsule   Oral   Take 100 mg by mouth 3 (three) times daily.         Marland Kitchen. HYDROcodone-acetaminophen (NORCO/VICODIN) 5-325  MG per tablet   Oral   Take 2 tablets by mouth every 6 (six) hours as needed for pain.         Marland Kitchen insulin detemir (LEVEMIR) 100 UNIT/ML injection   Subcutaneous   Inject 50 Units into the skin daily.         Marland Kitchen lisinopril (PRINIVIL,ZESTRIL) 10 MG tablet   Oral   Take 10 mg by mouth daily.           Marland Kitchen omeprazole (PRILOSEC) 20 MG capsule   Oral   Take 1 capsule (20 mg total) by mouth 2 (two) times daily.   60 capsule   6   . pioglitazone (ACTOS) 45 MG tablet   Oral   Take 45 mg by mouth daily.         . potassium chloride (MICRO-K) 10 MEQ CR capsule   Oral   Take 10 mEq by mouth daily.         . Tamsulosin HCl (FLOMAX) 0.4 MG CAPS   Oral   Take 0.4 mg by mouth daily.          BP 187/69  Pulse 65  Temp(Src) 97.8 F (36.6 C) (Oral)  Resp 20  Ht 6\' 2"  (1.88 m)  Wt 242 lb (109.77 kg)  BMI 31.06 kg/m2  SpO2 97%  Vital signs normal   Physical Exam  Nursing note and vitals reviewed. Constitutional: He is oriented to person, place, and time. He appears  well-developed and well-nourished.  Non-toxic appearance. He does not appear ill. No distress.  HENT:  Head: Normocephalic and atraumatic.  Right Ear: External ear normal.  Left Ear: External ear normal.  Nose: Nose normal. No mucosal edema or rhinorrhea.  Mouth/Throat: Oropharynx is clear and moist and mucous membranes are normal. No dental abscesses or uvula swelling.  Eyes: Conjunctivae and EOM are normal. Pupils are equal, round, and reactive to light.  Neck: Normal range of motion and full passive range of motion without pain. Neck supple.  Cardiovascular: Normal rate, regular rhythm and normal heart sounds.  Exam reveals no gallop and no friction rub.   No murmur heard. Pulmonary/Chest: Effort normal and breath sounds normal. No respiratory distress. He has no wheezes. He has no rhonchi. He has no rales. He exhibits no tenderness and no crepitus.  Abdominal: Soft. Normal appearance and bowel sounds are normal. He exhibits no distension. There is no tenderness. There is no rebound and no guarding.  Musculoskeletal: Normal range of motion. He exhibits no edema and no tenderness.  Tender to palpation in the left hip region. He has some shortening of his left lower leg in external rotation.  Patient is tender to palpation in his left shoulder. His clavicles nontender. His pain is in the anterior portion of the shoulder. There's no bruising seen.  Neurological: He is alert and oriented to person, place, and time. He has normal strength. No cranial nerve deficit.  Skin: Skin is warm, dry and intact. No rash noted. No erythema. No pallor.  Psychiatric: He has a normal mood and affect. His speech is normal and behavior is normal. His mood appears not anxious.    ED Course  Procedures (including critical care time)  Medications  morphine 4 MG/ML injection 4 mg (4 mg Intravenous Given 07/23/13 1849)  ondansetron (ZOFRAN) injection 4 mg (4 mg Intravenous Given 07/23/13 1849)  HYDROmorphone  (DILAUDID) injection 1 mg (1 mg Intravenous Given 07/23/13 2027)   Patient and family given results of his  x-rays. Advised of need for admission and surgery. They are agreeable.  20:23 Dr Romeo Apple, will let Dr Hilda Lias know about patient. Will not have surgery until tomorrow.  20:37 Dr Rito Ehrlich, admit to tele  Labs Review Results for orders placed during the hospital encounter of 07/23/13  CBC WITH DIFFERENTIAL      Result Value Ref Range   WBC 10.9 (*) 4.0 - 10.5 K/uL   RBC 3.66 (*) 4.22 - 5.81 MIL/uL   Hemoglobin 11.4 (*) 13.0 - 17.0 g/dL   HCT 16.1 (*) 09.6 - 04.5 %   MCV 93.4  78.0 - 100.0 fL   MCH 31.1  26.0 - 34.0 pg   MCHC 33.3  30.0 - 36.0 g/dL   RDW 40.9  81.1 - 91.4 %   Platelets 172  150 - 400 K/uL   Neutrophils Relative % 77  43 - 77 %   Neutro Abs 8.4 (*) 1.7 - 7.7 K/uL   Lymphocytes Relative 9 (*) 12 - 46 %   Lymphs Abs 1.0  0.7 - 4.0 K/uL   Monocytes Relative 13 (*) 3 - 12 %   Monocytes Absolute 1.4 (*) 0.1 - 1.0 K/uL   Eosinophils Relative 1  0 - 5 %   Eosinophils Absolute 0.1  0.0 - 0.7 K/uL   Basophils Relative 0  0 - 1 %   Basophils Absolute 0.0  0.0 - 0.1 K/uL  COMPREHENSIVE METABOLIC PANEL      Result Value Ref Range   Sodium 135 (*) 137 - 147 mEq/L   Potassium 4.2  3.7 - 5.3 mEq/L   Chloride 99  96 - 112 mEq/L   CO2 22  19 - 32 mEq/L   Glucose, Bld 187 (*) 70 - 99 mg/dL   BUN 40 (*) 6 - 23 mg/dL   Creatinine, Ser 7.82 (*) 0.50 - 1.35 mg/dL   Calcium 8.9  8.4 - 95.6 mg/dL   Total Protein 6.9  6.0 - 8.3 g/dL   Albumin 3.3 (*) 3.5 - 5.2 g/dL   AST 19  0 - 37 U/L   ALT 13  0 - 53 U/L   Alkaline Phosphatase 104  39 - 117 U/L   Total Bilirubin 0.4  0.3 - 1.2 mg/dL   GFR calc non Af Amer 19 (*) >90 mL/min   GFR calc Af Amer 22 (*) >90 mL/min  APTT      Result Value Ref Range   aPTT 29  24 - 37 seconds  PROTIME-INR      Result Value Ref Range   Prothrombin Time 13.7  11.6 - 15.2 seconds   INR 1.07  0.00 - 1.49  URINALYSIS, ROUTINE W REFLEX  MICROSCOPIC      Result Value Ref Range   Color, Urine YELLOW  YELLOW   APPearance CLEAR  CLEAR   Specific Gravity, Urine >1.030 (*) 1.005 - 1.030   pH 5.5  5.0 - 8.0   Glucose, UA NEGATIVE  NEGATIVE mg/dL   Hgb urine dipstick LARGE (*) NEGATIVE   Bilirubin Urine NEGATIVE  NEGATIVE   Ketones, ur NEGATIVE  NEGATIVE mg/dL   Protein, ur 30 (*) NEGATIVE mg/dL   Urobilinogen, UA 0.2  0.0 - 1.0 mg/dL   Nitrite NEGATIVE  NEGATIVE   Leukocytes, UA NEGATIVE  NEGATIVE  URINE MICROSCOPIC-ADD ON      Result Value Ref Range   WBC, UA 0-2  <3 WBC/hpf   RBC / HPF 11-20  <3 RBC/hpf  SAMPLE TO BLOOD  BANK      Result Value Ref Range   Blood Bank Specimen BBHLD     Sample Expiration 07/24/2013     Laboratory interpretation all normal except mild anemia     Imaging Review Dg Chest 1 View  07/23/2013   CLINICAL DATA:  Fall today.  Left hip fracture.  EXAM: CHEST - 1 VIEW  COMPARISON:  05/2013  FINDINGS: Cardiac silhouette is normal in size. The aorta is uncoiled. Changes from CABG surgery are stable. No mediastinal or hilar masses.  Clear lungs.  No pleural effusion or pneumothorax.  Bony thorax is demineralized.  IMPRESSION: No acute cardiopulmonary disease.   Electronically Signed   By: Amie Portland M.D.   On: 07/23/2013 20:00   Dg Hip Complete Left  07/23/2013   CLINICAL DATA:  Larey Seat today.  Left hip pain.  EXAM: LEFT HIP - COMPLETE 2+ VIEW  COMPARISON:  None.  FINDINGS: There is a fracture of the left femoral neck. The fracture is mid cervical, non comminuted with mild displacement. The distal fracture fragment has retracted superiorly in relation to the neck fracture fragment approximately 15 mm. There is no significant varus or valgus angulation.  No other fractures. The hip joints, SI joints and symphysis pubis are normally aligned. The bones are diffusely demineralized.  IMPRESSION: Left femoral neck fracture as described.   Electronically Signed   By: Amie Portland M.D.   On: 07/23/2013 19:59    Dg Shoulder Left  07/23/2013   CLINICAL DATA:  Fall.  Left shoulder pain.  EXAM: LEFT SHOULDER - 2+ VIEW  COMPARISON:  None.  FINDINGS: No fracture. No dislocation. There are mild to moderate AC joint osteoarthritic changes. The glenohumeral joint is well preserved. The underlying ribs are intact. The bones are demineralized.  IMPRESSION: No fracture or dislocation.   Electronically Signed   By: Amie Portland M.D.   On: 07/23/2013 19:59     EKG Interpretation   Date/Time:  Sunday July 23 2013 19:09:27 EST Ventricular Rate:  66 PR Interval:  148 QRS Duration: 86 QT Interval:  394 QTC Calculation: 413 R Axis:   -7 Text Interpretation:  Sinus rhythm with marked sinus arrhythmia Otherwise  normal ECG When compared with ECG of 28-Feb-2010 13:52, No significant  change was found Confirmed by Paisely Brick  MD-I, Leilany Digeronimo ([id number]) on 07/23/2013 7:26:05  PM      MDM   Final diagnoses:  Fall at home  Hip fracture, left  Contusion of shoulder, left    Plan admission   Devoria Albe, MD, FACEP   .    Ward Givens, MD 07/23/13 2044

## 2013-07-23 NOTE — H&P (Addendum)
[facility] History and Physical  [name] IPJ:[id number] DOB: 31-Jul-1933 DOA: 07/23/2013   PCP: Minta Balsam, MD  Specialists: Followed by cardiology.  Chief Complaint: pain in the left hip  HPI: [name] is a 80 y.o. male with a past medical history of coronary artery disease, status post CABG in 2003 for severe triple-vessel disease, diabetes on insulin, hypertension, chronic kidney disease, who was in his usual state of health earlier today when he was walking outside. He slipped and fell, and experienced excruciating pain in the left hip. EMS was called and the patient was brought into the hospital. Patient denies any syncopal episode. No dizziness, shortness of breath or chest pain prior to or after the fall. However, he does report shortness of breath with activity and occasional chest pain at rest at baseline. Denies any chest pain currently. He has poor functional capacity at baseline. Doesn't climb stairs. According to the wife he doesn't really do much. He has leg swelling, which is chronic for him. Patient is a very poor historian. Most of the history was provided by his wife.  Home Medications: Prior to Admission medications   Medication Sig Start Date End Date Taking? Authorizing Provider  aspirin 81 MG tablet Take 81 mg by mouth daily.   Yes Historical Provider, MD  atorvastatin (LIPITOR) 20 MG tablet Take 20 mg by mouth at bedtime.   Yes Historical Provider, MD  citalopram (CELEXA) 20 MG tablet Take 20 mg by mouth daily.   Yes Historical Provider, MD  furosemide (LASIX) 20 MG tablet Take 20 mg by mouth daily.   Yes Historical Provider, MD  gabapentin (NEURONTIN) 100 MG capsule Take 100 mg by mouth 3 (three) times daily.   Yes Historical Provider, MD  HYDROcodone-acetaminophen (NORCO/VICODIN) 5-325 MG per tablet Take 2 tablets by mouth every 6 (six) hours as needed for pain.   Yes Historical Provider, MD  insulin detemir (LEVEMIR) 100 UNIT/ML injection Inject  50 Units into the skin daily.   Yes Historical Provider, MD  lisinopril (PRINIVIL,ZESTRIL) 10 MG tablet Take 10 mg by mouth daily.     Yes Historical Provider, MD  omeprazole (PRILOSEC) 20 MG capsule Take 1 capsule (20 mg total) by mouth 2 (two) times daily. 05/24/13  Yes Minus Breeding, MD  pioglitazone (ACTOS) 45 MG tablet Take 45 mg by mouth daily.   Yes Historical Provider, MD  potassium chloride (MICRO-K) 10 MEQ CR capsule Take 10 mEq by mouth daily.   Yes Historical Provider, MD  Tamsulosin HCl (FLOMAX) 0.4 MG CAPS Take 0.4 mg by mouth daily.   Yes Historical Provider, MD    Allergies: No Known Allergies  Past Medical History: Past Medical History  Diagnosis Date  . Hypertension   . Hyperlipidemia   . Diabetes mellitus, type II, insulin dependent   . CAD (coronary artery disease)     s/p CABG  . Morbid obesity   . GERD (gastroesophageal reflux disease)   . History of tobacco abuse     Remote  . Back pain   . Hard of hearing   . Aortic root dilatation     Past Surgical History  Procedure Laterality Date  . Appendectomy    . Lumbar disc surgery      x2  . Coronary artery bypass graft      LAD diffuse luminal irregularities with focal proximal 70% stenosis, long mid 60% stenosis. Circumflex had large intermediate proximal and mid 50% stenosis. RCA had long 70% stenosis before in  RV branch and 99% stenosis before on acute marginal with TIMI I flow. EF was SVG to diagonal branch, SVG to seqeuntial obtuse marginal and distal circumflex and SVG to RCA  . Angioplasty    . Colonoscopy w/ polypectomy    . Skin cancer excision      Social History: Patient lives in [place] with his wife. He quit smoking after his bypass surgery in 2003. No alcohol use. No illicit drug use. Uses a cane to ambulate.  Family History:  Family History  Problem Relation Age of Onset  . Heart disease Father   . Heart attack Child      Review of Systems - History obtained from the  patient General ROS: positive for  - fatigue Psychological ROS: negative Ophthalmic ROS: negative ENT ROS: negative Allergy and Immunology ROS: negative Hematological and Lymphatic ROS: negative Endocrine ROS: negative Respiratory ROS: as in hpi Cardiovascular ROS: as in hpi Gastrointestinal ROS: no abdominal pain, change in bowel habits, or black or bloody stools Genito-Urinary ROS: no dysuria, trouble voiding, or hematuria Musculoskeletal ROS: as in hpi Neurological ROS: no TIA or stroke symptoms Dermatological ROS: negative  Physical Examination  Filed Vitals:   07/23/13 1830 07/23/13 1900 07/23/13 1915 07/23/13 2034  BP:  149/51  141/55  Pulse: 56 70 71 83  Temp:      TempSrc:      Resp: 17 17 14 16   Height:      Weight:      SpO2: 98% 92% 95% 96%    BP 141/55  Pulse 83  Temp(Src) 97.8 F (36.6 C) (Oral)  Resp 16  Ht 6' 2"  (1.88 m)  Wt 109.77 kg (242 lb)  BMI 31.06 kg/m2  SpO2 96%  General appearance: alert, cooperative, appears older than stated age, no distress and moderately obese Head: Normocephalic, without obvious abnormality, atraumatic Eyes: conjunctivae/corneas clear. PERRL, EOM's intact.  Throat: lips, mucosa, and tongue normal; teeth and gums normal Neck: no adenopathy, no carotid bruit, no JVD, supple, symmetrical, trachea midline and thyroid not enlarged, symmetric, no tenderness/mass/nodules Resp: clear to auscultation bilaterally Cardio: regular rate and rhythm, S1, S2 normal, no murmur, click, rub or gallop GI: soft, non-tender; bowel sounds normal; no masses,  no organomegaly Extremities: Left lower extremity is externally rotated. 1+ pitting edema bilateral lower extremity. Pulses: 2+ and symmetric Skin: Skin color, texture, turgor normal. No rashes or lesions Lymph nodes: Cervical, supraclavicular, and axillary nodes normal. Neurologic: He is alert and oriented x3. No focal neurological deficits are present.  Laboratory Data: Results for  orders placed during the hospital encounter of 07/23/13 (from the past 48 hour(s))  CBC WITH DIFFERENTIAL     Status: Abnormal   Collection Time    07/23/13  6:51 PM      Result Value Ref Range   WBC 10.9 (*) 4.0 - 10.5 K/uL   RBC 3.66 (*) 4.22 - 5.81 MIL/uL   Hemoglobin 11.4 (*) 13.0 - 17.0 g/dL   HCT 34.2 (*) 39.0 - 52.0 %   MCV 93.4  78.0 - 100.0 fL   MCH 31.1  26.0 - 34.0 pg   MCHC 33.3  30.0 - 36.0 g/dL   RDW 13.9  11.5 - 15.5 %   Platelets 172  150 - 400 K/uL   Neutrophils Relative % 77  43 - 77 %   Neutro Abs 8.4 (*) 1.7 - 7.7 K/uL   Lymphocytes Relative 9 (*) 12 - 46 %   Lymphs Abs 1.0  0.7 - 4.0 K/uL   Monocytes Relative 13 (*) 3 - 12 %   Monocytes Absolute 1.4 (*) 0.1 - 1.0 K/uL   Eosinophils Relative 1  0 - 5 %   Eosinophils Absolute 0.1  0.0 - 0.7 K/uL   Basophils Relative 0  0 - 1 %   Basophils Absolute 0.0  0.0 - 0.1 K/uL  COMPREHENSIVE METABOLIC PANEL     Status: Abnormal   Collection Time    07/23/13  6:51 PM      Result Value Ref Range   Sodium 135 (*) 137 - 147 mEq/L   Potassium 4.2  3.7 - 5.3 mEq/L   Chloride 99  96 - 112 mEq/L   CO2 22  19 - 32 mEq/L   Glucose, Bld 187 (*) 70 - 99 mg/dL   BUN 40 (*) 6 - 23 mg/dL   Creatinine, Ser 2.92 (*) 0.50 - 1.35 mg/dL   Calcium 8.9  8.4 - 10.5 mg/dL   Total Protein 6.9  6.0 - 8.3 g/dL   Albumin 3.3 (*) 3.5 - 5.2 g/dL   AST 19  0 - 37 U/L   ALT 13  0 - 53 U/L   Alkaline Phosphatase 104  39 - 117 U/L   Total Bilirubin 0.4  0.3 - 1.2 mg/dL   GFR calc non Af Amer 19 (*) >90 mL/min   GFR calc Af Amer 22 (*) >90 mL/min   Comment: (NOTE)     The eGFR has been calculated using the CKD EPI equation.     This calculation has not been validated in all clinical situations.     eGFR's persistently <90 mL/min signify possible Chronic Kidney     Disease.  APTT     Status: None   Collection Time    07/23/13  6:51 PM      Result Value Ref Range   aPTT 29  24 - 37 seconds  PROTIME-INR     Status: None   Collection Time     07/23/13  6:51 PM      Result Value Ref Range   Prothrombin Time 13.7  11.6 - 15.2 seconds   INR 1.07  0.00 - 1.49  SAMPLE TO BLOOD BANK     Status: None   Collection Time    07/23/13  6:51 PM      Result Value Ref Range   Blood Bank Specimen BBHLD     Sample Expiration 07/24/2013    URINALYSIS, ROUTINE W REFLEX MICROSCOPIC     Status: Abnormal   Collection Time    07/23/13  8:08 PM      Result Value Ref Range   Color, Urine YELLOW  YELLOW   APPearance CLEAR  CLEAR   Specific Gravity, Urine >1.030 (*) 1.005 - 1.030   pH 5.5  5.0 - 8.0   Glucose, UA NEGATIVE  NEGATIVE mg/dL   Hgb urine dipstick LARGE (*) NEGATIVE   Bilirubin Urine NEGATIVE  NEGATIVE   Ketones, ur NEGATIVE  NEGATIVE mg/dL   Protein, ur 30 (*) NEGATIVE mg/dL   Urobilinogen, UA 0.2  0.0 - 1.0 mg/dL   Nitrite NEGATIVE  NEGATIVE   Leukocytes, UA NEGATIVE  NEGATIVE  URINE MICROSCOPIC-ADD ON     Status: None   Collection Time    07/23/13  8:08 PM      Result Value Ref Range   WBC, UA 0-2  <3 WBC/hpf   RBC / HPF 11-20  <3 RBC/hpf  Radiology Reports: Dg Chest 1 View  07/23/2013   CLINICAL DATA:  Fall today.  Left hip fracture.  EXAM: CHEST - 1 VIEW  COMPARISON:  05/2013  FINDINGS: Cardiac silhouette is normal in size. The aorta is uncoiled. Changes from CABG surgery are stable. No mediastinal or hilar masses.  Clear lungs.  No pleural effusion or pneumothorax.  Bony thorax is demineralized.  IMPRESSION: No acute cardiopulmonary disease.   Electronically Signed   By: Lajean Manes M.D.   On: 07/23/2013 20:00   Dg Hip Complete Left  07/23/2013   CLINICAL DATA:  [place] today.  Left hip pain.  EXAM: LEFT HIP - COMPLETE 2+ VIEW  COMPARISON:  None.  FINDINGS: There is a fracture of the left femoral neck. The fracture is mid cervical, non comminuted with mild displacement. The distal fracture fragment has retracted superiorly in relation to the neck fracture fragment approximately 15 mm. There is no significant varus or valgus  angulation.  No other fractures. The hip joints, SI joints and symphysis pubis are normally aligned. The bones are diffusely demineralized.  IMPRESSION: Left femoral neck fracture as described.   Electronically Signed   By: Lajean Manes M.D.   On: 07/23/2013 19:59   Dg Shoulder Left  07/23/2013   CLINICAL DATA:  Fall.  Left shoulder pain.  EXAM: LEFT SHOULDER - 2+ VIEW  COMPARISON:  None.  FINDINGS: No fracture. No dislocation. There are mild to moderate AC joint osteoarthritic changes. The glenohumeral joint is well preserved. The underlying ribs are intact. The bones are demineralized.  IMPRESSION: No fracture or dislocation.   Electronically Signed   By: Lajean Manes M.D.   On: 07/23/2013 19:59    Electrocardiogram: Sinus rhythm with sinus arrhythmia 66 beats per minute. Normal axis. No Q waves. No concerning ST or T-wave changes noted.  Problem List  Principal Problem:   Hip fracture, left Active Problems:   DM   HYPERTENSION   CAD   GERD   CKD (chronic kidney disease) stage 4, GFR 15-29 ml/min   Assessment: This is a 80 year old, Caucasian male, who presents after a mechanical fall and sustained a left hip fracture. His Gupta Perioperative Cardiac Risk is 2.68% for perioperative cardiac complication. He has a low functional capacity. He gets the shortness of breath with minimal exertion and also gets chest pain occasionally, even at rest. He has been seen by his cardiologist within the last couple years and according to their notes he underwent a stress test either last year or in 2013. It is not clear from the notes. And that stress test apparently did not show any ischemic findings. The chest pain was felt to be noncardiac and possibly GI in origin.  Plan: #1 left hip fracture: Orthopedics has been consulted. He'll be admitted to the hospital. Pain medications were provided. He'll be kept on bedrest for now.  #2 preoperative evaluation: As discussed above, his Lyndel Safe Perioperative  Cardiac Risk is 2.68%. At this time we will consult cardiology to assist with the preoperative cardiac evaluation. If he indeed had a low risk stress test within the last one to 2 years he likely doesn't require any further workup before undergoing surgery, but I would defer to cardiology in this matter. At this time he cannot undergo surgery till seen by cardiology.  #3 known history of CAD, status post CABG: Stable. Continue with his home medications. He is not on a beta blocker for reasons that are not clear. Defer to cardiology in  this matter.  #4 chronic kidney disease stage 4: His creatinine was 2.2 in September 2014 and 1.78 in 2011. Renal function appears to be slightly worse than the last time it was checked. His UA does suggest some dehydration. We will given gentle IV hydration knowing that his EF was normal based on previous stress test. We will hold his ACE inhibitor. Repeat renal function in the morning.  #5 diabetes mellitus, type II, on insulin: Continue with his long acting insulin at a lower dose. Sliding scale coverage will be provided. HbA1c will be checked.  #6 history of hypertension: Monitor blood pressures closely. Treat accordingly.   DVT Prophylaxis: SCDs Code Status: Full code Family Communication: Discussed with his wife and daughter  Disposition Plan:  Admit to telemetry   Further management decisions will depend on results of further testing and patient's response to treatment.  [facility]  [facility] Pager [phone]  If 7PM-7AM, please contact night-coverage [email or web address] Password [facility]  07/23/2013, 9:16 PM

## 2013-07-24 ENCOUNTER — Encounter (HOSPITAL_COMMUNITY): Payer: Self-pay | Admitting: Adult Health

## 2013-07-24 DIAGNOSIS — N179 Acute kidney failure, unspecified: Secondary | ICD-10-CM

## 2013-07-24 DIAGNOSIS — N189 Chronic kidney disease, unspecified: Secondary | ICD-10-CM

## 2013-07-24 DIAGNOSIS — Y92009 Unspecified place in unspecified non-institutional (private) residence as the place of occurrence of the external cause: Secondary | ICD-10-CM

## 2013-07-24 DIAGNOSIS — S72009A Fracture of unspecified part of neck of unspecified femur, initial encounter for closed fracture: Principal | ICD-10-CM

## 2013-07-24 DIAGNOSIS — I10 Essential (primary) hypertension: Secondary | ICD-10-CM

## 2013-07-24 DIAGNOSIS — M25512 Pain in left shoulder: Secondary | ICD-10-CM | POA: Diagnosis present

## 2013-07-24 DIAGNOSIS — I2510 Atherosclerotic heart disease of native coronary artery without angina pectoris: Secondary | ICD-10-CM

## 2013-07-24 DIAGNOSIS — N184 Chronic kidney disease, stage 4 (severe): Secondary | ICD-10-CM

## 2013-07-24 DIAGNOSIS — Z01810 Encounter for preprocedural cardiovascular examination: Secondary | ICD-10-CM

## 2013-07-24 LAB — BASIC METABOLIC PANEL
BUN: 41 mg/dL — ABNORMAL HIGH (ref 6–23)
CALCIUM: 8.5 mg/dL (ref 8.4–10.5)
CO2: 24 mEq/L (ref 19–32)
CREATININE: 3.27 mg/dL — AB (ref 0.50–1.35)
Chloride: 103 mEq/L (ref 96–112)
GFR, EST AFRICAN AMERICAN: 19 mL/min — AB (ref >90)
GFR, EST NON AFRICAN AMERICAN: 17 mL/min — AB (ref >90)
Glucose, Bld: 150 mg/dL — ABNORMAL HIGH (ref 70–99)
Potassium: 4.5 mEq/L (ref 3.7–5.3)
Sodium: 138 mEq/L (ref 137–147)

## 2013-07-24 LAB — CBC
HCT: 33 % — ABNORMAL LOW (ref 39.0–52.0)
Hemoglobin: 11.3 g/dL — ABNORMAL LOW (ref 13.0–17.0)
MCH: 32.5 pg (ref 26.0–34.0)
MCHC: 34.2 g/dL (ref 30.0–36.0)
MCV: 94.8 fL (ref 78.0–100.0)
Platelets: 151 10*3/uL (ref 150–400)
RBC: 3.48 MIL/uL — ABNORMAL LOW (ref 4.22–5.81)
RDW: 14.1 % (ref 11.5–15.5)
WBC: 8.6 10*3/uL (ref 4.0–10.5)

## 2013-07-24 LAB — GLUCOSE, CAPILLARY
GLUCOSE-CAPILLARY: 154 mg/dL — AB (ref 70–99)
Glucose-Capillary: 132 mg/dL — ABNORMAL HIGH (ref 70–99)
Glucose-Capillary: 129 mg/dL — ABNORMAL HIGH (ref 70–99)
Glucose-Capillary: 152 mg/dL — ABNORMAL HIGH (ref 70–99)

## 2013-07-24 LAB — HEMOGLOBIN A1C
Hgb A1c MFr Bld: 6.6 % — ABNORMAL HIGH (ref <5.7)
Mean Plasma Glucose: 143 mg/dL — ABNORMAL HIGH (ref <117)

## 2013-07-24 MED ORDER — CEFAZOLIN SODIUM-DEXTROSE 2-3 GM-% IV SOLR: 2.0000 g | Freq: Once | INTRAVENOUS | Status: DC

## 2013-07-24 MED ORDER — METHOCARBAMOL 500 MG PO TABS
500.0000 mg | ORAL_TABLET | Freq: Three times a day (TID) | ORAL | Status: DC | PRN
  Administered 2013-07-24 – 2013-07-31 (×2): 500 mg via ORAL
  Filled 2013-07-24 (×2): qty 1

## 2013-07-24 MED ORDER — SODIUM CHLORIDE 0.9 % IV SOLN
INTRAVENOUS | Status: AC
  Administered 2013-07-24 – 2013-07-25 (×3): via INTRAVENOUS

## 2013-07-24 MED ORDER — POVIDONE-IODINE 10 % EX SOLN
Freq: Once | CUTANEOUS | Status: AC
  Administered 2013-07-24: 22:00:00 via TOPICAL
  Filled 2013-07-24: qty 118

## 2013-07-24 NOTE — Care Management Note (Signed)
    Page 1 of 1   07/28/2013     1:44:11 PM   CARE MANAGEMENT NOTE 07/28/2013  Patient:  Patsey BertholdSOUTHERN,Jaquil B   Account Number:  [id number]  Date Initiated:  07/24/2013  Documentation initiated by:  Rosemary HolmsOBSON,Monquie Fulgham  Subjective/Objective Assessment:   Pt from home where he lives with his wife. To have hip surgery tomorrow. Wife states she would like to take him home. CM discussed HH and potential for [facility] Rehab. [facility]. Previously had [facility]     Action/Plan:   Anticipated DC Date:     Anticipated DC Plan:  SKILLED NURSING FACILITY  In-house referral  Clinical Social Worker      DC Planning Services  CM consult      Choice offered to / List presented to:             Status of service:  Completed, signed off Medicare Important Message given?   (If response is "NO", the following Medicare IM given date fields will be blank) Date Medicare IM given:   Date Additional Medicare IM given:    Discharge Disposition:  SKILLED NURSING FACILITY  Per UR Regulation:    If discussed at Long Length of Stay Meetings, dates discussed:    Comments:  07/28/13 Rosemary HolmsAmy Marrion Finan RN BSN CM Anticipate DC back to SNF when medically stable  07/24/13 Madelein Mahadeo RN BNS CM

## 2013-07-24 NOTE — Progress Notes (Signed)
[facility] PROGRESS NOTE  [name] WUJ:[id number] DOB: Mar 31, 1934 DOA: 07/23/2013 PCP: Georgiann Mohs, MD  Assessment: This is a 80 year old, Caucasian male, who presented after a mechanical fall and sustained a left hip fracture. His Gupta Perioperative Cardiac Risk is 2.68% for perioperative cardiac complication. He has a low functional capacity. He gets the shortness of breath with minimal exertion and also gets chest pain occasionally, even at rest. Being evaluated by cardiology in anticipation of surgery  Assessment/Plan: #1 left hip fracture: Orthopedics has been consulted. Surgery scheduled for tomorrow. Continue with pain medications and add robaxin for muscle spasm.    #2 preoperative evaluation: As discussed above, his Chales Abrahams Perioperative Cardiac Risk is 2.68%. Appreciate cardiology assistance who opine he is acceptable perioperative cardiac risk.     #3 known history of CAD, status post CABG: Stable. Continue with his home medications. He is not on a beta blocker for reasons that are not clear. Defer to cardiology in this matter.   #4 acute on  chronic kidney disease stage 4:  On admission his creatinine was 2.9 and this am 3.2. It  was 2.2 in September 2014 and 1.78 in 2011.  His UA does suggest some dehydration. We will continue witgh gentle IV hydration knowing that his EF was normal based on previous stress test. Continue to  hold his ACE inhibitor and any other nephrotoxins. Monitor output. If no improvement consider renal US>  Repeat renal function in the morning.   #5 diabetes mellitus, type II, on insulin: Continue with his long acting insulin at a lower dose. Sliding scale coverage will be provided. HbA1c pending.  CBG range 132-166 while NPO. Will provide carb modified diet. Monitor  #6 history of hypertension: somewhat soft this am. Holding home ACE inhibitor and lasix.  Monitor blood pressures closely. Treat accordingly.   #7. Left shoulder pain: hx of same since  fall last month. Has seen Dr. Hilda Lias for this OP. No PT.  Worsening with this fall. Xray without fx. Some visible muscle spasm. Will add robaxin  #8: hematuria: microscopic. May be related to insertion of foley. Will monitor.   Code Status: full Family Communication: wife/children at bedside Disposition Plan: likely snf at discharge   Consultants:  Dr. Diona Browner cardiology  Dr. Hilda Lias ortho  Procedures:    Antibiotics:    HPI/Subjective: Awake complains pain left shoulder with "spasm". Hip pain  Objective: Filed Vitals:   07/24/13 0800  BP:   Pulse:   Temp:   Resp: 18    Intake/Output Summary (Last 24 hours) at 07/24/13 1014 Last data filed at 07/24/13 0617  Gross per 24 hour  Intake 526.25 ml  Output      0 ml  Net 526.25 ml   Filed Weights   07/23/13 1815 07/23/13 2133  Weight: 109.77 kg (242 lb) 122.199 kg (269 lb 6.4 oz)    Exam:   General:  Calm cooperative appears comfortable  Cardiovascular: RRR No MGR No LE edema  Respiratory: normal effort BS clear bilaterally no wheeze no rhonchi  Abdomen: soft +BS non-tender to palpation  Musculoskeletal:  Left leg externally rotated and slightly shorter. Left shoulder decreased ROM. Tender to palpation.   Data Reviewed: Basic Metabolic Panel:  Recent Labs Lab 07/23/13 1851 07/24/13 0500  NA 135* 138  K 4.2 4.5  CL 99 103  CO2 22 24  GLUCOSE 187* 150*  BUN 40* 41*  CREATININE 2.92* 3.27*  CALCIUM 8.9 8.5   Liver Function Tests:  Recent Labs  Lab 07/23/13 1851  AST 19  ALT 13  ALKPHOS 104  BILITOT 0.4  PROT 6.9  ALBUMIN 3.3*   No results found for this basename: LIPASE, AMYLASE,  in the last 168 hours No results found for this basename: AMMONIA,  in the last 168 hours CBC:  Recent Labs Lab 07/23/13 1851 07/24/13 0500  WBC 10.9* 8.6  NEUTROABS 8.4*  --   HGB 11.4* 11.3*  HCT 34.2* 33.0*  MCV 93.4 94.8  PLT 172 151   Cardiac Enzymes: No results found for this basename:  CKTOTAL, CKMB, CKMBINDEX, TROPONINI,  in the last 168 hours BNP (last 3 results) No results found for this basename: PROBNP,  in the last 8760 hours CBG:  Recent Labs Lab 07/23/13 2140 07/24/13 0803  GLUCAP 166* 132*    No results found for this or any previous visit (from the past 240 hour(s)).   Studies: Dg Chest 1 View  07/23/2013   CLINICAL DATA:  Fall today.  Left hip fracture.  EXAM: CHEST - 1 VIEW  COMPARISON:  05/2013  FINDINGS: Cardiac silhouette is normal in size. The aorta is uncoiled. Changes from CABG surgery are stable. No mediastinal or hilar masses.  Clear lungs.  No pleural effusion or pneumothorax.  Bony thorax is demineralized.  IMPRESSION: No acute cardiopulmonary disease.   Electronically Signed   By: Amie Portland M.D.   On: 07/23/2013 20:00   Dg Hip Complete Left  07/23/2013   CLINICAL DATA:  Larey Seat today.  Left hip pain.  EXAM: LEFT HIP - COMPLETE 2+ VIEW  COMPARISON:  None.  FINDINGS: There is a fracture of the left femoral neck. The fracture is mid cervical, non comminuted with mild displacement. The distal fracture fragment has retracted superiorly in relation to the neck fracture fragment approximately 15 mm. There is no significant varus or valgus angulation.  No other fractures. The hip joints, SI joints and symphysis pubis are normally aligned. The bones are diffusely demineralized.  IMPRESSION: Left femoral neck fracture as described.   Electronically Signed   By: Amie Portland M.D.   On: 07/23/2013 19:59   Dg Shoulder Left  07/23/2013   CLINICAL DATA:  Fall.  Left shoulder pain.  EXAM: LEFT SHOULDER - 2+ VIEW  COMPARISON:  None.  FINDINGS: No fracture. No dislocation. There are mild to moderate AC joint osteoarthritic changes. The glenohumeral joint is well preserved. The underlying ribs are intact. The bones are demineralized.  IMPRESSION: No fracture or dislocation.   Electronically Signed   By: Amie Portland M.D.   On: 07/23/2013 19:59    Scheduled Meds: .  atorvastatin  20 mg Oral QHS  . citalopram  20 mg Oral Daily  . gabapentin  100 mg Oral TID  . insulin aspart  0-5 Units Subcutaneous QHS  . insulin aspart  0-9 Units Subcutaneous TID WC  . insulin detemir  25 Units Subcutaneous Daily  . pantoprazole  40 mg Oral BID  . povidone-iodine   Topical Once  . tamsulosin  0.4 mg Oral Daily   Continuous Infusions: . sodium chloride      Principal Problem:   Hip fracture, left Active Problems:   DM   HYPERTENSION   CAD   GERD   CKD (chronic kidney disease) stage 4, GFR 15-29 ml/min   Preoperative cardiovascular examination   Acute on chronic renal failure   Left shoulder pain    Time spent: 35 minutes    [facility] M  [facility] Pager  [phone]. If 7PM-7AM, please contact night-coverage at [email or web address], password [email or web address] 07/24/2013, 10:14 AM  LOS: 1 day

## 2013-07-24 NOTE — Progress Notes (Signed)
Patient ID: [name], male   DOB: [birth year], 80 y.o.   MRN: [id number] 80 yo male with left femoral neck fracture displaced   Cr elevated CRF  DR Hilda LiasKeeling to see (patient of his)

## 2013-07-24 NOTE — Consult Note (Signed)
Reason for Consult:Fracture of the left hip Referring Physician: Hospitalist  TION TSE is an 80 y.o. male.  HPI: He fell on ice yesterday and hurt his left hip and left shoulder.  He has chronic pain of the left shoulder.  I just saw him in the office last week for this.  He has chronic lower back pain and left knee pain.  X-rays show a displaced left femoral neck fracture.  He has no other fractures.  I have told him he will need a bipolar hip replacement on the left.  I have gone over risks and imponderables including infection, embolus that could cause death, possible blood transfusion, possible nerve injury, need for physical therapy and nursing home placement after surgery and possible anesthesia risks.  He asked appropriate questions and appears to understand.  He is to see the cardiologist today.  I have scheduled surgery on the left hip tomorrow morning.  He is aware as is family.  Past Medical History  Diagnosis Date  . Hypertension   . Hyperlipidemia   . Diabetes mellitus, type II, insulin dependent   . CAD (coronary artery disease)     s/p CABG  . Morbid obesity   . GERD (gastroesophageal reflux disease)   . History of tobacco abuse     Remote  . Back pain   . Hard of hearing   . Aortic root dilatation     Past Surgical History  Procedure Laterality Date  . Appendectomy    . Lumbar disc surgery      x2  . Coronary artery bypass graft      LAD diffuse luminal irregularities with focal proximal 70% stenosis, long mid 60% stenosis. Circumflex had large intermediate proximal and mid 50% stenosis. RCA had long 70% stenosis before in RV branch and 99% stenosis before on acute marginal with TIMI I flow. EF was SVG to diagonal branch, SVG to seqeuntial obtuse marginal and distal circumflex and SVG to RCA  . Angioplasty    . Colonoscopy w/ polypectomy    . Skin cancer excision      Family History  Problem Relation Age of Onset  . Heart disease Father   . Heart attack  Child     Social History:  reports that he quit smoking about 13 years ago. He does not have any smokeless tobacco history on file. He reports that he does not drink alcohol or use illicit drugs.  Allergies: No Known Allergies  Medications: I have reviewed the patient's current medications.  Results for orders placed during the hospital encounter of 07/23/13 (from the past 48 hour(s))  CBC WITH DIFFERENTIAL     Status: Abnormal   Collection Time    07/23/13  6:51 PM      Result Value Ref Range   WBC 10.9 (*) 4.0 - 10.5 K/uL   RBC 3.66 (*) 4.22 - 5.81 MIL/uL   Hemoglobin 11.4 (*) 13.0 - 17.0 g/dL   HCT 34.2 (*) 39.0 - 52.0 %   MCV 93.4  78.0 - 100.0 fL   MCH 31.1  26.0 - 34.0 pg   MCHC 33.3  30.0 - 36.0 g/dL   RDW 13.9  11.5 - 15.5 %   Platelets 172  150 - 400 K/uL   Neutrophils Relative % 77  43 - 77 %   Neutro Abs 8.4 (*) 1.7 - 7.7 K/uL   Lymphocytes Relative 9 (*) 12 - 46 %   Lymphs Abs 1.0  0.7 - 4.0  K/uL   Monocytes Relative 13 (*) 3 - 12 %   Monocytes Absolute 1.4 (*) 0.1 - 1.0 K/uL   Eosinophils Relative 1  0 - 5 %   Eosinophils Absolute 0.1  0.0 - 0.7 K/uL   Basophils Relative 0  0 - 1 %   Basophils Absolute 0.0  0.0 - 0.1 K/uL  COMPREHENSIVE METABOLIC PANEL     Status: Abnormal   Collection Time    07/23/13  6:51 PM      Result Value Ref Range   Sodium 135 (*) 137 - 147 mEq/L   Potassium 4.2  3.7 - 5.3 mEq/L   Chloride 99  96 - 112 mEq/L   CO2 22  19 - 32 mEq/L   Glucose, Bld 187 (*) 70 - 99 mg/dL   BUN 40 (*) 6 - 23 mg/dL   Creatinine, Ser 2.92 (*) 0.50 - 1.35 mg/dL   Calcium 8.9  8.4 - 10.5 mg/dL   Total Protein 6.9  6.0 - 8.3 g/dL   Albumin 3.3 (*) 3.5 - 5.2 g/dL   AST 19  0 - 37 U/L   ALT 13  0 - 53 U/L   Alkaline Phosphatase 104  39 - 117 U/L   Total Bilirubin 0.4  0.3 - 1.2 mg/dL   GFR calc non Af Amer 19 (*) >90 mL/min   GFR calc Af Amer 22 (*) >90 mL/min   Comment: (NOTE)     The eGFR has been calculated using the CKD EPI equation.     This  calculation has not been validated in all clinical situations.     eGFR's persistently <90 mL/min signify possible Chronic Kidney     Disease.  APTT     Status: None   Collection Time    07/23/13  6:51 PM      Result Value Ref Range   aPTT 29  24 - 37 seconds  PROTIME-INR     Status: None   Collection Time    07/23/13  6:51 PM      Result Value Ref Range   Prothrombin Time 13.7  11.6 - 15.2 seconds   INR 1.07  0.00 - 1.49  SAMPLE TO BLOOD BANK     Status: None   Collection Time    07/23/13  6:51 PM      Result Value Ref Range   Blood Bank Specimen [id number]     Sample Expiration 07/24/2013    URINALYSIS, ROUTINE W REFLEX MICROSCOPIC     Status: Abnormal   Collection Time    07/23/13  8:08 PM      Result Value Ref Range   Color, Urine YELLOW  YELLOW   APPearance CLEAR  CLEAR   Specific Gravity, Urine >1.030 (*) 1.005 - 1.030   pH 5.5  5.0 - 8.0   Glucose, UA NEGATIVE  NEGATIVE mg/dL   Hgb urine dipstick LARGE (*) NEGATIVE   Bilirubin Urine NEGATIVE  NEGATIVE   Ketones, ur NEGATIVE  NEGATIVE mg/dL   Protein, ur 30 (*) NEGATIVE mg/dL   Urobilinogen, UA 0.2  0.0 - 1.0 mg/dL   Nitrite NEGATIVE  NEGATIVE   Leukocytes, UA NEGATIVE  NEGATIVE  URINE MICROSCOPIC-ADD ON     Status: None   Collection Time    07/23/13  8:08 PM      Result Value Ref Range   WBC, UA 0-2  <3 WBC/hpf   RBC / HPF 11-20  <3 RBC/hpf  GLUCOSE, CAPILLARY  Status: Abnormal   Collection Time    07/23/13  9:40 PM      Result Value Ref Range   Glucose-Capillary 166 (*) 70 - 99 mg/dL  CBC     Status: Abnormal   Collection Time    07/24/13  5:00 AM      Result Value Ref Range   WBC 8.6  4.0 - 10.5 K/uL   RBC 3.48 (*) 4.22 - 5.81 MIL/uL   Hemoglobin 11.3 (*) 13.0 - 17.0 g/dL   HCT 33.0 (*) 39.0 - 52.0 %   MCV 94.8  78.0 - 100.0 fL   MCH 32.5  26.0 - 34.0 pg   MCHC 34.2  30.0 - 36.0 g/dL   RDW 14.1  11.5 - 15.5 %   Platelets 151  150 - 400 K/uL  BASIC METABOLIC PANEL     Status: Abnormal   Collection  Time    07/24/13  5:00 AM      Result Value Ref Range   Sodium 138  137 - 147 mEq/L   Potassium 4.5  3.7 - 5.3 mEq/L   Chloride 103  96 - 112 mEq/L   CO2 24  19 - 32 mEq/L   Glucose, Bld 150 (*) 70 - 99 mg/dL   BUN 41 (*) 6 - 23 mg/dL   Creatinine, Ser 3.27 (*) 0.50 - 1.35 mg/dL   Calcium 8.5  8.4 - 10.5 mg/dL   GFR calc non Af Amer 17 (*) >90 mL/min   GFR calc Af Amer 19 (*) >90 mL/min   Comment: (NOTE)     The eGFR has been calculated using the CKD EPI equation.     This calculation has not been validated in all clinical situations.     eGFR's persistently <90 mL/min signify possible Chronic Kidney     Disease.    Dg Chest 1 View  07/23/2013   CLINICAL DATA:  Fall today.  Left hip fracture.  EXAM: CHEST - 1 VIEW  COMPARISON:  05/2013  FINDINGS: Cardiac silhouette is normal in size. The aorta is uncoiled. Changes from CABG surgery are stable. No mediastinal or hilar masses.  Clear lungs.  No pleural effusion or pneumothorax.  Bony thorax is demineralized.  IMPRESSION: No acute cardiopulmonary disease.   Electronically Signed   By: Lajean Manes M.D.   On: 07/23/2013 20:00   Dg Hip Complete Left  07/23/2013   CLINICAL DATA:  [place] today.  Left hip pain.  EXAM: LEFT HIP - COMPLETE 2+ VIEW  COMPARISON:  None.  FINDINGS: There is a fracture of the left femoral neck. The fracture is mid cervical, non comminuted with mild displacement. The distal fracture fragment has retracted superiorly in relation to the neck fracture fragment approximately 15 mm. There is no significant varus or valgus angulation.  No other fractures. The hip joints, SI joints and symphysis pubis are normally aligned. The bones are diffusely demineralized.  IMPRESSION: Left femoral neck fracture as described.   Electronically Signed   By: Lajean Manes M.D.   On: 07/23/2013 19:59   Dg Shoulder Left  07/23/2013   CLINICAL DATA:  Fall.  Left shoulder pain.  EXAM: LEFT SHOULDER - 2+ VIEW  COMPARISON:  None.  FINDINGS: No fracture.  No dislocation. There are mild to moderate AC joint osteoarthritic changes. The glenohumeral joint is well preserved. The underlying ribs are intact. The bones are demineralized.  IMPRESSION: No fracture or dislocation.   Electronically Signed   By: Lajean Manes  M.D.   On: 07/23/2013 19:59    Review of Systems  HENT:       Decreased hearing.  Cardiovascular:       History of heart surgery, coronary artery disease, hypertension, hyperlipidemia, obesity.  Gastrointestinal:       GERD  Genitourinary:       Chronic kidney failure, stage 4  Musculoskeletal: Positive for falls (He fell on ice yesterday and hurt his left hip.  He has some increased left shoulder pain but no fracture there.).       History of left knee pain, marked DJD of the left knee.  History of chronic lower back pain, no paresthesias.  History of left shoulder pain, chronic, and chronic bursitis of the shoulder.  Endo/Heme/Allergies:       Diabetes and obesity.   Blood pressure 99/53, pulse 75, temperature 98.2 F (36.8 C), temperature source Oral, resp. rate 18, height 6' 2"  (1.88 m), weight 122.199 kg (269 lb 6.4 oz), SpO2 91.00%. Physical Exam  Constitutional: He is oriented to person, place, and time. He appears well-developed and well-nourished.  HENT:  Head: Normocephalic and atraumatic.  Eyes: Conjunctivae and EOM are normal. Pupils are equal, round, and reactive to light.  Neck: Normal range of motion. Neck supple.  Cardiovascular: Normal rate, regular rhythm and intact distal pulses.   Respiratory: Effort normal.  Musculoskeletal: He exhibits tenderness (Pain of the left hip area with any motion.  Slight shortening, external rotation of the hip.).       Left shoulder: He exhibits tenderness, bony tenderness and pain.       Left hip: He exhibits decreased range of motion, tenderness and deformity.       Arms:      Legs: Neurological: He is oriented to person, place, and time. He has normal reflexes.  Skin:  Skin is warm and dry.  Psychiatric: He has a normal mood and affect. His behavior is normal. Judgment and thought content normal.    Assessment/Plan: Femoral neck fracture of the left hip displaced.  For bipolar hip replacement tomorrow. History of stage 4 chronic kidney disease Diabetes mellitus GERD Chronic lower back and left shoulder and left knee pain, DJD Hypertension Coronary artery disease Obesity   Adriann Thau 07/24/2013, 7:58 AM

## 2013-07-24 NOTE — Progress Notes (Signed)
Utilization Review Complete  

## 2013-07-24 NOTE — Progress Notes (Signed)
Patient seen and examined.  Above note reviewed and agree.  Plans are for operative repair of left hip fracture tomorrow. The patient does have chronic kidney disease his creatinine is trending up. He is receiving IV fluids. We'll recheck renal function in the morning. Check renal ultrasound. If renal function continues to deteriorate, may need nephrology input. Has been evaluated for surgery by cardiology.  [name]

## 2013-07-24 NOTE — Consult Note (Signed)
CARDIOLOGY CONSULT NOTE   Patient ID: [name] MRN: [id number] DOB/AGE: 01-12-1934 80 y.o.  Admit Date: 07/23/2013 Referring Physician: Darreld Mclean MD Primary Physician: Georgiann Mohs, MD Consulting Cardiologist: Nona Dell MD Primary Cardiologist: Rollene Rotunda MD [facility]) Reason for Consultation: Pre-Op evaluation for hip repair, with known history of CAD.  Clinical Summary [name] is a 80 y.o.male admitted after mechanical fall sustaining left hip fracture. He is due to have bipolar left hip replacement. We are asked for cardiac pre-op in the setting of a history of severe triple vessel CAD with hx of CABG - records detailed below.   Patient states he was walking back from gathering eggs to his home, slipped in some mud at the beginning of a ramp leading to his front door. He said it took 4 people to help him up. He also complains of some left shoulder soreness. This is chronic for him and has been seen by Dr. Hilda Lias two week ago for this.  He has chronic chest discomfort "for years" usually associated with occasional exertion and some deep breathing.  Non-limiting. He has chronic DOE related to his wt and deconditioning. He did not have chest pain or dizziness when he fell. Lexiscan Myoview from July 2012 demonstrated LVEF 61%, possible mid to basal inferior ischemia versus variable attenuation, overall low risk study. Patient also reportedly had a low-risk dobutamine echocardiogram in February of 2014. He has been managed medically over time.  ECG shows normal sinus rhythm with PAC.   No Known Allergies  Medications Scheduled Medications: . atorvastatin  20 mg Oral QHS  . citalopram  20 mg Oral Daily  . gabapentin  100 mg Oral TID  . insulin aspart  0-5 Units Subcutaneous QHS  . insulin aspart  0-9 Units Subcutaneous TID WC  . insulin detemir  25 Units Subcutaneous Daily  . pantoprazole  40 mg Oral BID  . povidone-iodine   Topical Once  .  tamsulosin  0.4 mg Oral Daily    Infusions: . sodium chloride 75 mL/hr at 07/23/13 2316    PRN Medications: HYDROcodone-acetaminophen, morphine injection   Past Medical History  Diagnosis Date  . Hypertension   . Hyperlipidemia   . Diabetes mellitus, type II, insulin dependent   . Coronary atherosclerosis of native coronary artery 2003    Multivessel s/p CABG  . Morbid obesity   . GERD (gastroesophageal reflux disease)   . History of tobacco abuse   . Back pain   . Hard of hearing   . Aortic root dilatation   . CKD (chronic kidney disease) stage 4, GFR 15-29 ml/min     Past Surgical History  Procedure Laterality Date  . Appendectomy    . Lumbar disc surgery      x2  . Coronary artery bypass graft  June 2003    LIMA to LAD, SVG to diagonal, sequential SVG to obtuse marginal and circumf SVG to RCAl  . Angioplasty    . Colonoscopy w/ polypectomy    . Skin cancer excision      Family History  Problem Relation Age of Onset  . Heart disease Father   . Heart attack Child     Social History [name] reports that he quit smoking about 13 years ago. His smoking use included Cigarettes. He has a 60 pack-year smoking history. He does not have any smokeless tobacco history on file. [name] reports that he does not drink alcohol.  Review of Systems Otherwise reviewed  and negative except as outlined.  Physical Examination Blood pressure 99/53, pulse 75, temperature 98.2 F (36.8 C), temperature source Oral, resp. rate 18, height 6\' 2"  (1.88 m), weight 269 lb 6.4 oz (122.199 kg), SpO2 91.00%.  Intake/Output Summary (Last 24 hours) at 07/24/13 0918 Last data filed at 07/24/13 0617  Gross per 24 hour  Intake 526.25 ml  Output      0 ml  Net 526.25 ml    Telemetry: NSR with PAC's.  Patient in no distress. HEENT: Conjunctiva and lids normal, oropharynx clear. Neck: Supple, no elevated JVP or carotid bruits, no thyromegaly. Lungs: Clear to auscultation,  nonlabored breathing at rest. Cardiac: Regular rate and rhythm, no S3 or significant systolic murmur, no pericardial rub. Abdomen: Soft, nontender, no hepatomegaly, bowel sounds present, no guarding or rebound. Extremities: No pitting edema, distal pulses 2+. Skin: Warm and dry. Musculoskeletal: No kyphosis. Neuropsychiatric: Alert and oriented x3, affect grossly appropriate.  Echocardiogram 07/05/2012: EF 55-60%; no WMAs; mild aortic root dilatation (per office note per CoreySerpe PA 07/22/2012)..  Lab Results  Basic Metabolic Panel:  Recent Labs Lab 07/23/13 1851 07/24/13 0500  NA 135* 138  K 4.2 4.5  CL 99 103  CO2 22 24  GLUCOSE 187* 150*  BUN 40* 41*  CREATININE 2.92* 3.27*  CALCIUM 8.9 8.5    Liver Function Tests:  Recent Labs Lab 07/23/13 1851  AST 19  ALT 13  ALKPHOS 104  BILITOT 0.4  PROT 6.9  ALBUMIN 3.3*    CBC:  Recent Labs Lab 07/23/13 1851 07/24/13 0500  WBC 10.9* 8.6  NEUTROABS 8.4*  --   HGB 11.4* 11.3*  HCT 34.2* 33.0*  MCV 93.4 94.8  PLT 172 151    Radiology: Dg Chest 1 View  07/23/2013   CLINICAL DATA:  Fall today.  Left hip fracture.  EXAM: CHEST - 1 VIEW  COMPARISON:  05/2013  FINDINGS: Cardiac silhouette is normal in size. The aorta is uncoiled. Changes from CABG surgery are stable. No mediastinal or hilar masses.  Clear lungs.  No pleural effusion or pneumothorax.  Bony thorax is demineralized.  IMPRESSION: No acute cardiopulmonary disease.   Electronically Signed   By: Amie Portland M.D.   On: 07/23/2013 20:00   Dg Hip Complete Left  07/23/2013   CLINICAL DATA:  Larey Seat today.  Left hip pain.  EXAM: LEFT HIP - COMPLETE 2+ VIEW  COMPARISON:  None.  FINDINGS: There is a fracture of the left femoral neck. The fracture is mid cervical, non comminuted with mild displacement. The distal fracture fragment has retracted superiorly in relation to the neck fracture fragment approximately 15 mm. There is no significant varus or valgus angulation.  No  other fractures. The hip joints, SI joints and symphysis pubis are normally aligned. The bones are diffusely demineralized.  IMPRESSION: Left femoral neck fracture as described.   Electronically Signed   By: Amie Portland M.D.   On: 07/23/2013 19:59   Dg Shoulder Left  07/23/2013   CLINICAL DATA:  Fall.  Left shoulder pain.  EXAM: LEFT SHOULDER - 2+ VIEW  COMPARISON:  None.  FINDINGS: No fracture. No dislocation. There are mild to moderate AC joint osteoarthritic changes. The glenohumeral joint is well preserved. The underlying ribs are intact. The bones are demineralized.  IMPRESSION: No fracture or dislocation.   Electronically Signed   By: Amie Portland M.D.   On: 07/23/2013 19:59    Impression and Recommendations  1. Preoperative evaluation: He has been seen  by Dr. Antoine PocheHochrein in the past, records reviewed. Overall clinically stable without accelerating angina or CHF symptoms. Last ischemic workup was in February 2014, overall low risk. Current ECG is nonspecific. He is being considered for bipolar left hip replacement following mechanical fall with fracture. Review of home medications has him on ASA, ACE-I, Lasix and statin. Has not been on beta blocker over time, no clear indication to institute one now. Would follow telemetry, check ECG postoperatively. He should be able to proceed with surgery an acceptable perioperative cardiac risk.  2. Hypertension: ACE inhibitor has been discontinued during this admission with acute on chronic renal failure. IV fluids are infusing. Most recent systolic blood pressure around 100.  3. Diabetes mellitus: Blood glucose is being maintained per PCP.   4. Acute on chronic renal failure: Creatinine up to 3.2. Baseline not certain although creatinine was 2.2 back in September 2014. Agree with holding ACE inhibitor for now. Further workup per primary team.  Signed: Bettey MareKathryn M. Lyman BishopLawrence NP [facility] 07/24/2013, 9:18 AM Co-Sign MD  Attending note:  Patient  seen and examined. Reviewed records and modified above note by Ms. Lawrence NP. Mr. Adria DevonSouthard presents after a mechanical fall with subsequent left hip fracture. He is being considered for bipolar hip replacement. From a cardiac perspective, he has had no progressive angina symptoms, no CHF. I reviewed his ischemic workup since 2012, including a reportedly low risk dobutamine echocardiogram from last year. Current ECG is nonspecific. Outpatient medical regimen as noted above, absent a beta blocker. He does have acute on chronic renal failure, ACE inhibitor has been held. This may require further workup by the primary team. From a cardiac perspective, he should be able to proceed with left hip surgery at an acceptable perioperative cardiac risk. Would have him on telemetry, check followup ECG postoperatively. No clear indication to institute beta blocker at this time.  Jonelle SidleSamuel G. Eliyana Pagliaro, M.D., F.A.C.C.

## 2013-07-25 ENCOUNTER — Inpatient Hospital Stay (HOSPITAL_COMMUNITY): Payer: Medicare Other

## 2013-07-25 ENCOUNTER — Inpatient Hospital Stay (HOSPITAL_COMMUNITY): Payer: Medicare Other | Admitting: Anesthesiology

## 2013-07-25 ENCOUNTER — Encounter (HOSPITAL_COMMUNITY)
Admission: EM | Disposition: A | Discharge status: Skilled Nursing Facility | Payer: Self-pay | Source: Home / Self Care | Attending: Family Medicine

## 2013-07-25 ENCOUNTER — Encounter (HOSPITAL_COMMUNITY): Payer: Medicare Other | Admitting: Anesthesiology

## 2013-07-25 ENCOUNTER — Encounter (HOSPITAL_COMMUNITY): Payer: Self-pay | Admitting: *Deleted

## 2013-07-25 HISTORY — PX: HIP ARTHROPLASTY: SHX981

## 2013-07-25 LAB — CBC WITH DIFFERENTIAL/PLATELET
BASOS PCT: 0 % (ref 0–1)
Basophils Absolute: 0 10*3/uL (ref 0.0–0.1)
Basophils Absolute: 0 10*3/uL (ref 0.0–0.1)
Basophils Relative: 0 % (ref 0–1)
EOS ABS: 0.3 10*3/uL (ref 0.0–0.7)
EOS PCT: 0 % (ref 0–5)
Eosinophils Absolute: 0 10*3/uL (ref 0.0–0.7)
Eosinophils Relative: 3 % (ref 0–5)
HCT: 30 % — ABNORMAL LOW (ref 39.0–52.0)
HEMATOCRIT: 28.6 % — AB (ref 39.0–52.0)
HEMOGLOBIN: 9.8 g/dL — AB (ref 13.0–17.0)
Hemoglobin: 10.2 g/dL — ABNORMAL LOW (ref 13.0–17.0)
LYMPHS ABS: 1.4 10*3/uL (ref 0.7–4.0)
LYMPHS ABS: 0.7 10*3/uL (ref 0.7–4.0)
Lymphocytes Relative: 16 % (ref 12–46)
Lymphocytes Relative: 6 % — ABNORMAL LOW (ref 12–46)
MCH: 32.2 pg (ref 26.0–34.0)
MCH: 31.5 pg (ref 26.0–34.0)
MCHC: 34.3 g/dL (ref 30.0–36.0)
MCHC: 34 g/dL (ref 30.0–36.0)
MCV: 94.1 fL (ref 78.0–100.0)
MCV: 92.6 fL (ref 78.0–100.0)
MONO ABS: 1.1 10*3/uL — AB (ref 0.1–1.0)
Monocytes Absolute: 1.2 10*3/uL — ABNORMAL HIGH (ref 0.1–1.0)
Monocytes Relative: 13 % — ABNORMAL HIGH (ref 3–12)
Monocytes Relative: 11 % (ref 3–12)
Neutro Abs: 5.7 10*3/uL (ref 1.7–7.7)
Neutro Abs: 8.5 10*3/uL — ABNORMAL HIGH (ref 1.7–7.7)
Neutrophils Relative %: 67 % (ref 43–77)
Neutrophils Relative %: 82 % — ABNORMAL HIGH (ref 43–77)
Platelets: 120 10*3/uL — ABNORMAL LOW (ref 150–400)
Platelets: 123 10*3/uL — ABNORMAL LOW (ref 150–400)
RBC: 3.04 MIL/uL — AB (ref 4.22–5.81)
RBC: 3.24 MIL/uL — AB (ref 4.22–5.81)
RDW: 13.7 % (ref 11.5–15.5)
RDW: 14.2 % (ref 11.5–15.5)
WBC: 8.6 10*3/uL (ref 4.0–10.5)
WBC: 10.3 10*3/uL (ref 4.0–10.5)

## 2013-07-25 LAB — SURGICAL PCR SCREEN
MRSA, PCR: NEGATIVE
Staphylococcus aureus: NEGATIVE

## 2013-07-25 LAB — BASIC METABOLIC PANEL
BUN: 43 mg/dL — AB (ref 6–23)
BUN: 45 mg/dL — ABNORMAL HIGH (ref 6–23)
CO2: 22 mEq/L (ref 19–32)
CO2: 21 meq/L (ref 19–32)
CREATININE: 3.11 mg/dL — AB (ref 0.50–1.35)
CREATININE: 3.09 mg/dL — AB (ref 0.50–1.35)
Calcium: 8.5 mg/dL (ref 8.4–10.5)
Calcium: 8.5 mg/dL (ref 8.4–10.5)
Chloride: 105 mEq/L (ref 96–112)
Chloride: 104 mEq/L (ref 96–112)
GFR calc Af Amer: 21 mL/min — ABNORMAL LOW (ref >90)
GFR calc non Af Amer: 18 mL/min — ABNORMAL LOW (ref >90)
GFR, EST AFRICAN AMERICAN: 21 mL/min — AB (ref >90)
GFR, EST NON AFRICAN AMERICAN: 18 mL/min — AB (ref >90)
GLUCOSE: 144 mg/dL — AB (ref 70–99)
Glucose, Bld: 215 mg/dL — ABNORMAL HIGH (ref 70–99)
POTASSIUM: 4.7 meq/L (ref 3.7–5.3)
Potassium: 5.2 mEq/L (ref 3.7–5.3)
SODIUM: 136 meq/L — AB (ref 137–147)
Sodium: 138 mEq/L (ref 137–147)

## 2013-07-25 LAB — GLUCOSE, CAPILLARY
GLUCOSE-CAPILLARY: 144 mg/dL — AB (ref 70–99)
GLUCOSE-CAPILLARY: 145 mg/dL — AB (ref 70–99)
GLUCOSE-CAPILLARY: 161 mg/dL — AB (ref 70–99)
GLUCOSE-CAPILLARY: 244 mg/dL — AB (ref 70–99)
Glucose-Capillary: 283 mg/dL — ABNORMAL HIGH (ref 70–99)

## 2013-07-25 LAB — HEMOGLOBIN AND HEMATOCRIT, BLOOD
HCT: 31.2 % — ABNORMAL LOW (ref 39.0–52.0)
HEMOGLOBIN: 10 g/dL — AB (ref 13.0–17.0)

## 2013-07-25 LAB — URINALYSIS, ROUTINE W REFLEX MICROSCOPIC
Bilirubin Urine: NEGATIVE
Glucose, UA: 100 mg/dL — AB
Ketones, ur: NEGATIVE mg/dL
NITRITE: NEGATIVE
Protein, ur: 30 mg/dL — AB
Specific Gravity, Urine: >1.03 — ABNORMAL HIGH (ref 1.005–1.030)
UROBILINOGEN UA: 0.2 mg/dL (ref 0.0–1.0)
pH: 5 (ref 5.0–8.0)

## 2013-07-25 LAB — URINE MICROSCOPIC-ADD ON

## 2013-07-25 LAB — PREPARE RBC (CROSSMATCH)

## 2013-07-25 LAB — ABO/RH: ABO/RH(D): O POS

## 2013-07-25 SURGERY — HEMIARTHROPLASTY, HIP, DIRECT ANTERIOR APPROACH, FOR FRACTURE
Anesthesia: Spinal | Site: Hip | Laterality: Left

## 2013-07-25 MED ORDER — LACTATED RINGERS IV SOLN
INTRAVENOUS | Status: DC
  Administered 2013-07-25: 10:00:00 via INTRAVENOUS
  Administered 2013-07-25: 1000 mL via INTRAVENOUS

## 2013-07-25 MED ORDER — SODIUM CHLORIDE 0.9 % IV SOLN
INTRAVENOUS | Status: DC | PRN
  Administered 2013-07-25: 11:00:00 via INTRAVENOUS

## 2013-07-25 MED ORDER — EPINEPHRINE HCL 0.1 MG/ML IJ SOSY
PREFILLED_SYRINGE | INTRAMUSCULAR | Status: DC | PRN
  Administered 2013-07-25: .1 ug via INTRATRACHEAL

## 2013-07-25 MED ORDER — MIDAZOLAM HCL 2 MG/2ML IJ SOLN
1.0000 mg | INTRAMUSCULAR | Status: DC | PRN
  Administered 2013-07-25 (×2): 2 mg via INTRAVENOUS

## 2013-07-25 MED ORDER — ENOXAPARIN SODIUM 40 MG/0.4ML ~~LOC~~ SOLN
40.0000 mg | SUBCUTANEOUS | Status: DC
  Administered 2013-07-26: 40 mg via SUBCUTANEOUS
  Filled 2013-07-25: qty 0.4

## 2013-07-25 MED ORDER — DEXTROSE 5 % IV SOLN
1.0000 g | INTRAVENOUS | Status: DC
  Administered 2013-07-25 – 2013-07-27 (×3): 1 g via INTRAVENOUS
  Filled 2013-07-25 (×6): qty 10

## 2013-07-25 MED ORDER — PROPOFOL 10 MG/ML IV BOLUS
INTRAVENOUS | Status: AC
  Filled 2013-07-25: qty 20

## 2013-07-25 MED ORDER — HYDROGEN PEROXIDE 3 % EX SOLN
CUTANEOUS | Status: DC | PRN
  Administered 2013-07-25: 1

## 2013-07-25 MED ORDER — MAGNESIUM HYDROXIDE 400 MG/5ML PO SUSP
30.0000 mL | Freq: Every day | ORAL | Status: DC | PRN
Start: 2013-07-25 — End: 2013-07-28

## 2013-07-25 MED ORDER — FENTANYL CITRATE 0.05 MG/ML IJ SOLN
INTRAMUSCULAR | Status: DC | PRN
  Administered 2013-07-25: 12.5 ug via INTRATHECAL

## 2013-07-25 MED ORDER — FENTANYL CITRATE 0.05 MG/ML IJ SOLN
INTRAMUSCULAR | Status: DC | PRN
  Administered 2013-07-25 (×3): 12.5 ug via INTRAVENOUS
  Administered 2013-07-25 (×2): 25 ug via INTRAVENOUS

## 2013-07-25 MED ORDER — FENTANYL CITRATE 0.05 MG/ML IJ SOLN
25.0000 ug | INTRAMUSCULAR | Status: AC
  Administered 2013-07-25 (×2): 25 ug via INTRAVENOUS

## 2013-07-25 MED ORDER — BUPIVACAINE IN DEXTROSE 0.75-8.25 % IT SOLN
INTRATHECAL | Status: AC
  Filled 2013-07-25: qty 2

## 2013-07-25 MED ORDER — PROPOFOL INFUSION 10 MG/ML OPTIME
INTRAVENOUS | Status: DC | PRN
  Administered 2013-07-25: 100 ug/kg/min via INTRAVENOUS
  Administered 2013-07-25: 25 ug/kg/min via INTRAVENOUS
  Administered 2013-07-25: 35 ug/kg/min via INTRAVENOUS
  Administered 2013-07-25: 20 ug/kg/min via INTRAVENOUS

## 2013-07-25 MED ORDER — LIDOCAINE HCL (CARDIAC) 10 MG/ML IV SOLN
INTRAVENOUS | Status: DC | PRN
  Administered 2013-07-25: 20 mg via INTRAVENOUS

## 2013-07-25 MED ORDER — FENTANYL CITRATE 0.05 MG/ML IJ SOLN
INTRAMUSCULAR | Status: AC
  Filled 2013-07-25: qty 2

## 2013-07-25 MED ORDER — CEFAZOLIN SODIUM-DEXTROSE 2-3 GM-% IV SOLR
INTRAVENOUS | Status: AC
  Filled 2013-07-25: qty 50

## 2013-07-25 MED ORDER — MIDAZOLAM HCL 2 MG/2ML IJ SOLN
INTRAMUSCULAR | Status: AC
  Filled 2013-07-25: qty 2

## 2013-07-25 MED ORDER — ONDANSETRON HCL 4 MG/2ML IJ SOLN
4.0000 mg | Freq: Once | INTRAMUSCULAR | Status: AC | PRN
  Administered 2013-07-25: 4 mg via INTRAVENOUS
  Filled 2013-07-25: qty 2

## 2013-07-25 MED ORDER — ACETAMINOPHEN 325 MG PO TABS
650.0000 mg | ORAL_TABLET | Freq: Four times a day (QID) | ORAL | Status: DC | PRN
  Administered 2013-07-25 – 2013-07-30 (×3): 650 mg via ORAL
  Filled 2013-07-25 (×3): qty 2

## 2013-07-25 MED ORDER — FENTANYL CITRATE 0.05 MG/ML IJ SOLN
25.0000 ug | INTRAMUSCULAR | Status: DC | PRN
  Administered 2013-07-25 (×4): 50 ug via INTRAVENOUS
  Filled 2013-07-25 (×2): qty 2

## 2013-07-25 MED ORDER — DEXTROSE 5 % IV SOLN
INTRAVENOUS | Status: AC
  Filled 2013-07-25: qty 10

## 2013-07-25 MED ORDER — SODIUM CHLORIDE 0.9 % IR SOLN
Status: DC | PRN
  Administered 2013-07-25: 1000 mL

## 2013-07-25 MED ORDER — BUPIVACAINE IN DEXTROSE 0.75-8.25 % IT SOLN
INTRATHECAL | Status: DC | PRN
  Administered 2013-07-25: 15 mg via INTRATHECAL

## 2013-07-25 MED ORDER — SODIUM CHLORIDE 0.9 % IV SOLN
INTRAVENOUS | Status: DC
  Administered 2013-07-25: 15:00:00 via INTRAVENOUS

## 2013-07-25 MED ORDER — CEFAZOLIN SODIUM-DEXTROSE 2-3 GM-% IV SOLR
2.0000 g | Freq: Once | INTRAVENOUS | Status: AC
  Administered 2013-07-25: 2 g via INTRAVENOUS

## 2013-07-25 MED ORDER — ALBUTEROL SULFATE (2.5 MG/3ML) 0.083% IN NEBU
INHALATION_SOLUTION | RESPIRATORY_TRACT | Status: AC
  Administered 2013-07-25: 2.5 mg
  Filled 2013-07-25: qty 3

## 2013-07-25 MED ORDER — ALBUTEROL SULFATE (2.5 MG/3ML) 0.083% IN NEBU
2.5000 mg | INHALATION_SOLUTION | Freq: Four times a day (QID) | RESPIRATORY_TRACT | Status: DC
  Administered 2013-07-25 – 2013-07-26 (×5): 2.5 mg via RESPIRATORY_TRACT
  Filled 2013-07-25 (×5): qty 3

## 2013-07-25 SURGICAL SUPPLY — 59 items
BAG HAMPER (MISCELLANEOUS) ×3 IMPLANT
BLADE SAGITTAL 25.0X1.27X90 (BLADE) ×2 IMPLANT
BLADE SAGITTAL 25.0X1.27X90MM (BLADE) ×1
BLADE SURG SZ20 CARB STEEL (BLADE) ×3 IMPLANT
CHLORAPREP W/TINT 26ML (MISCELLANEOUS) ×8 IMPLANT
CLOTH BEACON ORANGE TIMEOUT ST (SAFETY) ×3 IMPLANT
COVER LIGHT HANDLE STERIS (MISCELLANEOUS) ×6 IMPLANT
COVER MAYO STAND XLG (DRAPE) ×3 IMPLANT
COVER X RAY CASSETTE (MISCELLANEOUS) ×3 IMPLANT
DRAIN PENROSE 18X.75 LTX STRL (MISCELLANEOUS) ×3 IMPLANT
DRAPE ORTHO 2.5IN SPLIT 77X108 (DRAPES) ×2 IMPLANT
DRAPE ORTHO SPLIT 77X108 STRL (DRAPES) ×6
DRAPE PROXIMA HALF (DRAPES) ×3 IMPLANT
EVACUATOR 3/16  PVC DRAIN (DRAIN) ×2
EVACUATOR 3/16 PVC DRAIN (DRAIN) ×1 IMPLANT
FACESHIELD LNG OPTICON STERILE (SAFETY) ×3 IMPLANT
FORMALIN 10 PREFIL 480ML (MISCELLANEOUS) ×3 IMPLANT
GAUZE XEROFORM 5X9 LF (GAUZE/BANDAGES/DRESSINGS) ×3 IMPLANT
GLOVE BIO SURGEON STRL SZ8 (GLOVE) ×5 IMPLANT
GLOVE BIO SURGEON STRL SZ8.5 (GLOVE) ×3 IMPLANT
GLOVE BIOGEL PI IND STRL 7.0 (GLOVE) IMPLANT
GLOVE BIOGEL PI IND STRL 7.5 (GLOVE) IMPLANT
GLOVE BIOGEL PI INDICATOR 7.0 (GLOVE) ×6
GLOVE BIOGEL PI INDICATOR 7.5 (GLOVE) ×6
GLOVE ECLIPSE 7.0 STRL STRAW (GLOVE) ×8 IMPLANT
GLOVE SS BIOGEL STRL SZ 6.5 (GLOVE) IMPLANT
GLOVE SUPERSENSE BIOGEL SZ 6.5 (GLOVE) ×4
GOWN STRL REUS W/TWL LRG LVL3 (GOWN DISPOSABLE) ×9 IMPLANT
GOWN STRL REUS W/TWL XL LVL3 (GOWN DISPOSABLE) ×3 IMPLANT
HANDPIECE INTERPULSE COAX TIP (DISPOSABLE)
HIP BIPOLAR TANDEM POROUS ×2 IMPLANT
INST SET MAJOR BONE (KITS) ×3 IMPLANT
IV NS IRRIG 3000ML ARTHROMATIC (IV SOLUTION) IMPLANT
KIT BLADEGUARD II DBL (SET/KITS/TRAYS/PACK) ×3 IMPLANT
KIT ROOM TURNOVER APOR (KITS) ×3 IMPLANT
MANIFOLD NEPTUNE II (INSTRUMENTS) ×3 IMPLANT
MARKER SKIN DUAL TIP RULER LAB (MISCELLANEOUS) ×3 IMPLANT
NS IRRIG 1000ML POUR BTL (IV SOLUTION) ×3 IMPLANT
PACK TOTAL JOINT (CUSTOM PROCEDURE TRAY) ×3 IMPLANT
PAD ABD 5X9 TENDERSORB (GAUZE/BANDAGES/DRESSINGS) ×6 IMPLANT
PAD ARMBOARD 7.5X6 YLW CONV (MISCELLANEOUS) ×3 IMPLANT
PILLOW HIP ABDUCTION LRG (ORTHOPEDIC SUPPLIES) ×2 IMPLANT
PILLOW HIP ABDUCTION MED (ORTHOPEDIC SUPPLIES) IMPLANT
SET BASIN LINEN APH (SET/KITS/TRAYS/PACK) ×3 IMPLANT
SET HNDPC FAN SPRY TIP SCT (DISPOSABLE) IMPLANT
SPONGE DRAIN TRACH 4X4 STRL 2S (GAUZE/BANDAGES/DRESSINGS) ×3 IMPLANT
SPONGE GAUZE 4X4 12PLY (GAUZE/BANDAGES/DRESSINGS) ×3 IMPLANT
STAPLER VISISTAT 35W (STAPLE) ×3 IMPLANT
SUT BRALON NAB BRD #1 30IN (SUTURE) ×11 IMPLANT
SUT CHROMIC 1 CTX 36 (SUTURE) ×12 IMPLANT
SUT PLAIN 2 0 XLH (SUTURE) ×15 IMPLANT
SUT SILK 0 FSL (SUTURE) ×3 IMPLANT
SWAB CULTURE LIQ STUART DBL (MISCELLANEOUS) ×3 IMPLANT
SYR BULB IRRIGATION 50ML (SYRINGE) ×2 IMPLANT
TAPE MEDIFIX FOAM 3 (GAUZE/BANDAGES/DRESSINGS) ×3 IMPLANT
TOWER CARTRIDGE SMART MIX (DISPOSABLE) IMPLANT
TRAY FOLEY CATH 16FR SILVER (SET/KITS/TRAYS/PACK) ×3 IMPLANT
WATER STERILE IRR 1000ML POUR (IV SOLUTION) ×6 IMPLANT
YANKAUER SUCT 12FT TUBE ARGYLE (SUCTIONS) ×3 IMPLANT

## 2013-07-25 NOTE — Anesthesia Postprocedure Evaluation (Signed)
Anesthesia Post Note  Patient: [name]  Procedure(s) Performed: Procedure(s) (LRB): ARTHROPLASTY BIPOLAR HIP (Left)  Anesthesia type: Spinal  Patient location: PACU  Post pain: Pain level controlled  Post assessment: Post-op Vital signs reviewed, Patient's Cardiovascular Status Stable, Respiratory Function Stable, Patent Airway, No signs of Nausea or vomiting and Pain level controlled  Last Vitals:  Filed Vitals:   07/25/13 1214  BP: 139/60  Pulse: 61  Temp: 37 C  Resp: 18    Post vital signs: Reviewed and stable  Level of consciousness: awake and alert   Complications: No apparent anesthesia complications

## 2013-07-25 NOTE — Progress Notes (Signed)
Patient seen and examined.  Above note reviewed.  This patient was admitted to the hospital after a fall and resultant left hip fracture. He underwent surgical management today. He was seen by cardiology preoperatively for cardiac clearance. The patient was also found to have acute on chronic kidney disease stage IV. Creatinine has started up since admission. He is continued on IV fluids. ACE inhibitor was held on admission. Renal ultrasound shows mild left-sided hydronephrosis. It is unclear if this is contributing to his renal failure. [facility] Renal function continues to decline, may need nephrology input. We'll continue IV fluids for now. Will likely need nursing home placement on discharge.  [name]

## 2013-07-25 NOTE — Anesthesia Procedure Notes (Addendum)
Procedure Name: MAC Date/Time: 07/25/2013 8:41 AM Performed by: Franco NonesYATES, Kaylanie Capili S Pre-anesthesia Checklist: Patient identified, Emergency Drugs available, Suction available, Timeout performed and Patient being monitored Patient Re-evaluated:Patient Re-evaluated prior to inductionOxygen Delivery Method: Nasal Cannula    Spinal  Patient location during procedure: OR Start time: 07/25/2013 8:53 AM End time: 07/25/2013 8:59 AM Staffing CRNA/Resident: Minerva AreolaYATES, Harkirat Orozco S Preanesthetic Checklist Completed: patient identified, site marked, surgical consent, pre-op evaluation, timeout performed, IV checked, risks and benefits discussed and monitors and equipment checked Spinal Block Patient position: left lateral decubitus Prep: Betadine Patient monitoring: heart rate, cardiac monitor, continuous pulse ox and blood pressure Approach: left paramedian Location: L3-4 Injection technique: single-shot Needle Needle type: Spinocan  Needle gauge: 22 G Needle length: 9 cm Assessment Sensory level: T8 Additional Notes Betadine prep x3 1% lidocaine skinwheal  Clear CSF pre and post injection  ATTEMPTS: 2 TRAY ID: [id number] TRAY EXPIRATION DATE: 2016-01

## 2013-07-25 NOTE — OR Nursing (Signed)
Spoke to Dr. Hilda LiasKeeling before surgery about patient may need 3g Ancef instead of 2g Ancef. Dr. Hilda LiasKeeling stated that he wants 2g ancef instead of 3g Ancef due to kidney failure.

## 2013-07-25 NOTE — Transfer of Care (Signed)
Immediate Anesthesia Transfer of Care Note  Patient: [name]  Procedure(s) Performed: Procedure(s) (LRB): ARTHROPLASTY BIPOLAR HIP (Left)  Patient Location: PACU  Anesthesia Type: SAB  Level of Consciousness: awake  Airway & Oxygen Therapy: Patient Spontanous Breathing and non-rebreather face mask  Post-op Assessment: Report given to PACU RN, Post -op Vital signs reviewed and stable  Post vital signs: Reviewed and stable  Complications: No apparent anesthesia complications

## 2013-07-25 NOTE — Brief Op Note (Signed)
07/23/2013 - 07/25/2013  12:01 PM  PATIENT:  [name]  80 y.o. male  PRE-OPERATIVE DIAGNOSIS:  Fracture Left Hip, femoral neck  POST-OPERATIVE DIAGNOSIS:  Fracture Left Hip  PROCEDURE:  Procedure(s): ARTHROPLASTY BIPOLAR HIP (Left)  SURGEON:  Surgeon(s) and Role:  Dontez Hauss    * Darreld McleanWayne Aminat Shelburne, MD - Primary  PHYSICIAN ASSISTANT:   ASSISTANTS: Dallas,RN; Page, RN   ANESTHESIA:   spinal  EBL:  Total I/O In: 1635 [I.V.:1300; Blood:335] Out: 860 [Urine:300; Blood:560]  BLOOD ADMINISTERED:250 CC PRBC  DRAINS: (Large) Hemovact drain(s) in the Left hip area with  Suction Open   LOCAL MEDICATIONS USED:  NONE  SPECIMEN:  Source of Specimen:  Left femoral head  DISPOSITION OF SPECIMEN:  PATHOLOGY  COUNTS:  YES  TOURNIQUET:  * No tourniquets in log *  DICTATION: .Other Dictation: Dictation Number [id number]  PLAN OF CARE: Admit to inpatient   PATIENT DISPOSITION:  PACU - hemodynamically stable.   Delay start of Pharmacological VTE agent (>24hrs) due to surgical blood loss or risk of bleeding: no

## 2013-07-25 NOTE — Progress Notes (Signed)
Ice pack to left hip. Hgb/Hct drawn and sent to lab for results.

## 2013-07-25 NOTE — Progress Notes (Signed)
Patient's temp 102.7 oral,Dr Rito EhrlichKrishnan notified,orders received,and given.Will continue to monitor patient.

## 2013-07-25 NOTE — Progress Notes (Signed)
[facility] PROGRESS NOTE  [name]:[id number] DOB: 10-25-1933 DOA: 07/23/2013 PCP: Georgiann Mohs, MD  Assessment/Plan: Assessment: This is a 80 year old, Caucasian male, who presented after a mechanical fall and sustained a left hip fracture. His Gupta Perioperative Cardiac Risk is 2.68% for perioperative cardiac complication. He has a low functional capacity. He gets shortness of breath with minimal exertion and also gets chest pain occasionally, even at rest. Evaluated by cardiology and cleared for surgery. Hip repair on 07/25/13.    Assessment/Plan:  #1 left hip fracture: s/p arthroplasty left hip. Management per ortho. Continue with pain medications and robaxin for muscle spasm.   #2 known history of CAD, status post CABG: Stable. Continue with his home medications. He is not on a beta blocker for reasons that are not clear. Defer to cardiology in this matter.   #4 acute on chronic kidney disease stage 4: On admission his creatinine was 2.9 and this am 3.2. It was 2.2 in September 2014 and 1.78 in 2011. His UA on admission suggested some dehydration. Provided with gentle IV hydration knowing that his EF was normal based on previous stress test. Little improvement this am. Will continue fluids post-op as only slight improvement in creatinine. Continue to hold his ACE inhibitor and any other nephrotoxins. Monitor output. Await renal US.  Repeat renal function in the morning.   #5 diabetes mellitus, type II, on insulin: Continue with his long acting insulin at a lower dose. Sliding scale coverage will be provided. HbA1c 6.6.  CBG range 144-161. Will provide carb modified diet. Monitor   #6 history of hypertension: controlled. Holding home ACE inhibitor and lasix. Monitor blood pressures closely. Treat accordingly.   #7. Left shoulder pain: hx of same since fall last month. Has seen Dr. Hilda Lias for this OP. No PT. Worsening with this fall. Xray without fx. Some visible muscle spasm.  Continue  #8: hematuria: microscopic. May be related to insertion of foley. Will monitor.    Code Status: full Family Communication: wife and children at bedside Disposition Plan: may need short term placement   Consultants:  orthopedics  Procedures:  Left hip athroplasty  Antibiotics:   HPI/Subjective: Sitting up in bed eating lunch. Reports left hip pain "10".   Objective: Filed Vitals:   07/25/13 1345  BP: 146/60  Pulse: 77  Temp:   Resp: 16    Intake/Output Summary (Last 24 hours) at 07/25/13 1412 Last data filed at 07/25/13 1345  Gross per 24 hour  Intake 2683.75 ml  Output    920 ml  Net 1763.75 ml   Filed Weights   07/23/13 1815 07/23/13 2133  Weight: 109.77 kg (242 lb) 122.199 kg (269 lb 6.4 oz)    Exam:   General:  Well nourished NAD  Cardiovascular: RRR no m/g/r no LE edema  Respiratory: normal effort BS clear bilaterally. No wheeze  Abdomen: soft +BS non-tender to palpation  Musculoskeletal: dressing to left hip dry and intact.    Data Reviewed: Basic Metabolic Panel:  Recent Labs Lab 07/23/13 1851 07/24/13 0500 07/25/13 0526  NA 135* 138 138  K 4.2 4.5 4.7  CL 99 103 105  CO2 22 24 22   GLUCOSE 187* 150* 144*  BUN 40* 41* 43*  CREATININE 2.92* 3.27* 3.11*  CALCIUM 8.9 8.5 8.5   Liver Function Tests:  Recent Labs Lab 07/23/13 1851  AST 19  ALT 13  ALKPHOS 104  BILITOT 0.4  PROT 6.9  ALBUMIN 3.3*   No results found for this  basename: LIPASE, AMYLASE,  in the last 168 hours No results found for this basename: AMMONIA,  in the last 168 hours CBC:  Recent Labs Lab 07/23/13 1851 07/24/13 0500 07/25/13 0526 07/25/13 1230  WBC 10.9* 8.6 8.6  --   NEUTROABS 8.4*  --  5.7  --   HGB 11.4* 11.3* 9.8* 10.0*  HCT 34.2* 33.0* 28.6* 31.2*  MCV 93.4 94.8 94.1  --   PLT 172 151 120*  --    Cardiac Enzymes: No results found for this basename: CKTOTAL, CKMB, CKMBINDEX, TROPONINI,  in the last 168 hours BNP (last 3  results) No results found for this basename: PROBNP,  in the last 8760 hours CBG:  Recent Labs Lab 07/24/13 1703 07/24/13 2004 07/25/13 0738 07/25/13 0804 07/25/13 1225  GLUCAP 152* 154* 145* 144* 161*    Recent Results (from the past 240 hour(s))  SURGICAL PCR SCREEN     Status: None   Collection Time    07/25/13  7:49 AM      Result Value Ref Range Status   MRSA, PCR NEGATIVE  NEGATIVE Final   Staphylococcus aureus NEGATIVE  NEGATIVE Final   Comment:            The Xpert SA Assay (FDA     approved for NASAL specimens     in patients over 13 years of age),     is one component of     a comprehensive surveillance     program.  Test performance has     been validated by [employer] for patients greater     than or equal to 66 year old.     It is not intended     to diagnose infection nor to     guide or monitor treatment.     Studies: Dg Chest 1 View  07/23/2013   CLINICAL DATA:  Fall today.  Left hip fracture.  EXAM: CHEST - 1 VIEW  COMPARISON:  05/2013  FINDINGS: Cardiac silhouette is normal in size. The aorta is uncoiled. Changes from CABG surgery are stable. No mediastinal or hilar masses.  Clear lungs.  No pleural effusion or pneumothorax.  Bony thorax is demineralized.  IMPRESSION: No acute cardiopulmonary disease.   Electronically Signed   By: Amie Portland M.D.   On: 07/23/2013 20:00   Dg Hip Complete Left  07/23/2013   CLINICAL DATA:  Larey Seat today.  Left hip pain.  EXAM: LEFT HIP - COMPLETE 2+ VIEW  COMPARISON:  None.  FINDINGS: There is a fracture of the left femoral neck. The fracture is mid cervical, non comminuted with mild displacement. The distal fracture fragment has retracted superiorly in relation to the neck fracture fragment approximately 15 mm. There is no significant varus or valgus angulation.  No other fractures. The hip joints, SI joints and symphysis pubis are normally aligned. The bones are diffusely demineralized.  IMPRESSION: Left femoral neck  fracture as described.   Electronically Signed   By: Amie Portland M.D.   On: 07/23/2013 19:59   Dg Shoulder Left  07/23/2013   CLINICAL DATA:  Fall.  Left shoulder pain.  EXAM: LEFT SHOULDER - 2+ VIEW  COMPARISON:  None.  FINDINGS: No fracture. No dislocation. There are mild to moderate AC joint osteoarthritic changes. The glenohumeral joint is well preserved. The underlying ribs are intact. The bones are demineralized.  IMPRESSION: No fracture or dislocation.   Electronically Signed   By: Renard Hamper.D.  On: 07/23/2013 19:59   Dg Hip Portable 1 View Left  07/25/2013   CLINICAL DATA:  Portable view status post left hip arthroplasty  EXAM: PORTABLE LEFT HIP - 1 VIEW  COMPARISON:  DG HIP COMPLETE*L* dated 07/23/2013  FINDINGS: The initial film reveals the presence of the femoral component of the prosthesis. Subsequently the shaft of the femoral head component is demonstrated. Subsequent to that of the acetabular cup is visible. There is no immediate postprocedure complication demonstrated.  IMPRESSION: The patient has undergone left total hip joint prosthesis placement without evidence of immediate postprocedure complication.   Electronically Signed   By: David  SwazilandJordan   On: 07/25/2013 11:43    Scheduled Meds: . atorvastatin  20 mg Oral QHS  . citalopram  20 mg Oral Daily  . gabapentin  100 mg Oral TID  . insulin aspart  0-5 Units Subcutaneous QHS  . insulin aspart  0-9 Units Subcutaneous TID WC  . insulin detemir  25 Units Subcutaneous Daily  . pantoprazole  40 mg Oral BID  . tamsulosin  0.4 mg Oral Daily   Continuous Infusions:   Principal Problem:   Hip fracture, left Active Problems:   DM   HYPERTENSION   CAD   GERD   CKD (chronic kidney disease) stage 4, GFR 15-29 ml/min   Preoperative cardiovascular examination   Acute on chronic renal failure   Left shoulder pain    Time spent: 35 minutes    Tenn[facility] Healthcare - ClevelandBLACK,KAREN M  [facility] Pager [phone]. If 7PM-7AM, please contact  night-coverage at [email or web address], password [email or web address] 07/25/2013, 2:12 PM  LOS: 2 days

## 2013-07-25 NOTE — Anesthesia Preprocedure Evaluation (Signed)
Anesthesia Evaluation  Patient identified by MRN, date of birth, ID band Patient awake    Reviewed: Allergy & Precautions, H&P , NPO status , Patient's Chart, lab work & pertinent test results  Airway Mallampati: III TM Distance: >3 FB Neck ROM: Full    Dental  (+) Teeth Intact, Dental Advisory Given   Pulmonary former smoker,  breath sounds clear to auscultation        Cardiovascular hypertension, Pt. on medications + CAD, + CABG and + Peripheral Vascular Disease Rhythm:Regular Rate:Normal     Neuro/Psych    GI/Hepatic GERD-  Controlled and Medicated,  Endo/Other  diabetes, Type 2, Insulin Dependent, Oral Hypoglycemic AgentsMorbid obesity  Renal/GU CRFRenal disease     Musculoskeletal   Abdominal   Peds  Hematology   Anesthesia Other Findings   Reproductive/Obstetrics                           Anesthesia Physical Anesthesia Plan  ASA: III  Anesthesia Plan: Spinal   Post-op Pain Management:    Induction:   Airway Management Planned: Simple Face Mask  Additional Equipment:   Intra-op Plan:   Post-operative Plan:   Informed Consent: I have reviewed the patients History and Physical, chart, labs and discussed the procedure including the risks, benefits and alternatives for the proposed anesthesia with the patient or authorized representative who has indicated his/her understanding and acceptance.     Plan Discussed with:   Anesthesia Plan Comments:         Anesthesia Quick Evaluation

## 2013-07-25 NOTE — Progress Notes (Signed)
From OR. [place]. Expiratory wheezes. Bilateral breath sounds clear and equal on auscultation. resp adequate/nonlabored. O2 continued.

## 2013-07-25 NOTE — Progress Notes (Signed)
Dr Jayme CloudGonzalez notified of hgb 10.0 and hct 31.2. No new orders given.

## 2013-07-26 DIAGNOSIS — D649 Anemia, unspecified: Secondary | ICD-10-CM | POA: Diagnosis present

## 2013-07-26 DIAGNOSIS — D696 Thrombocytopenia, unspecified: Secondary | ICD-10-CM | POA: Diagnosis present

## 2013-07-26 DIAGNOSIS — N1330 Unspecified hydronephrosis: Secondary | ICD-10-CM | POA: Diagnosis present

## 2013-07-26 LAB — CBC WITH DIFFERENTIAL/PLATELET
BASOS PCT: 0 % (ref 0–1)
Basophils Absolute: 0 10*3/uL (ref 0.0–0.1)
EOS ABS: 0 10*3/uL (ref 0.0–0.7)
Eosinophils Relative: 0 % (ref 0–5)
HCT: 27.2 % — ABNORMAL LOW (ref 39.0–52.0)
Hemoglobin: 9.2 g/dL — ABNORMAL LOW (ref 13.0–17.0)
Lymphocytes Relative: 12 % (ref 12–46)
Lymphs Abs: 1.3 10*3/uL (ref 0.7–4.0)
MCH: 31.5 pg (ref 26.0–34.0)
MCHC: 33.8 g/dL (ref 30.0–36.0)
MCV: 93.2 fL (ref 78.0–100.0)
Monocytes Absolute: 1.9 10*3/uL — ABNORMAL HIGH (ref 0.1–1.0)
Monocytes Relative: 18 % — ABNORMAL HIGH (ref 3–12)
NEUTROS PCT: 69 % (ref 43–77)
Neutro Abs: 7.4 10*3/uL (ref 1.7–7.7)
PLATELETS: 99 10*3/uL — AB (ref 150–400)
RBC: 2.92 MIL/uL — ABNORMAL LOW (ref 4.22–5.81)
RDW: 14.1 % (ref 11.5–15.5)
WBC: 10.7 10*3/uL — ABNORMAL HIGH (ref 4.0–10.5)

## 2013-07-26 LAB — GLUCOSE, CAPILLARY
GLUCOSE-CAPILLARY: 214 mg/dL — AB (ref 70–99)
GLUCOSE-CAPILLARY: 212 mg/dL — AB (ref 70–99)
Glucose-Capillary: 264 mg/dL — ABNORMAL HIGH (ref 70–99)
Glucose-Capillary: 190 mg/dL — ABNORMAL HIGH (ref 70–99)

## 2013-07-26 LAB — BASIC METABOLIC PANEL
BUN: 51 mg/dL — AB (ref 6–23)
CALCIUM: 8.2 mg/dL — AB (ref 8.4–10.5)
CO2: 20 mEq/L (ref 19–32)
Chloride: 105 mEq/L (ref 96–112)
Creatinine, Ser: 3.29 mg/dL — ABNORMAL HIGH (ref 0.50–1.35)
GFR calc Af Amer: 19 mL/min — ABNORMAL LOW (ref >90)
GFR, EST NON AFRICAN AMERICAN: 17 mL/min — AB (ref >90)
GLUCOSE: 214 mg/dL — AB (ref 70–99)
Potassium: 5 mEq/L (ref 3.7–5.3)
SODIUM: 137 meq/L (ref 137–147)

## 2013-07-26 LAB — PHOSPHORUS: Phosphorus: 3.6 mg/dL (ref 2.3–4.6)

## 2013-07-26 MED ORDER — SODIUM CHLORIDE 0.9 % IV SOLN
INTRAVENOUS | Status: DC
  Administered 2013-07-26 – 2013-07-28 (×4): via INTRAVENOUS

## 2013-07-26 MED ORDER — ENOXAPARIN SODIUM 30 MG/0.3ML ~~LOC~~ SOLN
30.0000 mg | SUBCUTANEOUS | Status: DC
  Administered 2013-07-27: 30 mg via SUBCUTANEOUS
  Filled 2013-07-26: qty 0.3

## 2013-07-26 MED ORDER — MORPHINE SULFATE 2 MG/ML IJ SOLN
0.5000 mg | INTRAMUSCULAR | Status: DC | PRN
  Administered 2013-07-27 – 2013-07-28 (×3): 0.5 mg via INTRAVENOUS
  Filled 2013-07-26 (×4): qty 1

## 2013-07-26 MED ORDER — FUROSEMIDE 10 MG/ML IJ SOLN
40.0000 mg | Freq: Two times a day (BID) | INTRAMUSCULAR | Status: DC
  Administered 2013-07-26 – 2013-07-30 (×8): 40 mg via INTRAVENOUS
  Filled 2013-07-26 (×8): qty 4

## 2013-07-26 NOTE — Progress Notes (Signed)
Small run of VTach, vitals stable patient unsymptomatic.  oncall MD notified.  Will continue to monitor.

## 2013-07-26 NOTE — Progress Notes (Signed)
Consulting cardiologist: Nona Dell MD  Subjective:    Post operative pain. No other complaints.  Objective:   Temp:  [97.4 F (36.3 C)-102.7 F (39.3 C)] 98.1 F (36.7 C) (03/04 0417) Pulse Rate:  [61-97] 79 (03/04 0417) Resp:  [14-20] 20 (03/04 0417) BP: (119-186)/(47-73) 126/66 mmHg (03/04 0417) SpO2:  [74 %-100 %] 95 % (03/04 0739)    Filed Weights   07/23/13 1815 07/23/13 2133  Weight: 242 lb (109.77 kg) 269 lb 6.4 oz (122.199 kg)    Intake/Output Summary (Last 24 hours) at 07/26/13 1002 Last data filed at 07/26/13 0651  Gross per 24 hour  Intake 2090.83 ml  Output   1640 ml  Net 450.83 ml    Telemetry: NSR with occasional PVC's. 70-80's.   Exam:  General: No acute distress.  Lungs: Some expiratory wheezes.  Cardiac: No elevated JVP or bruits. RRR, no gallop or rub.   Extremities: No pitting edema, distal pulses full.  Neuropsychiatric: Slightly sedated from pain management. Repeats himself.     Lab Results:  Basic Metabolic Panel:  Recent Labs Lab 07/25/13 0526 07/25/13 1826 07/26/13 0522  NA 138 136* 137  K 4.7 5.2 5.0  CL 105 104 105  CO2 22 21 20   GLUCOSE 144* 215* 214*  BUN 43* 45* 51*  CREATININE 3.11* 3.09* 3.29*  CALCIUM 8.5 8.5 8.2*    Liver Function Tests:  Recent Labs Lab 07/23/13 1851  AST 19  ALT 13  ALKPHOS 104  BILITOT 0.4  PROT 6.9  ALBUMIN 3.3*    CBC:  Recent Labs Lab 07/25/13 0526 07/25/13 1230 07/25/13 1826 07/26/13 0522  WBC 8.6  --  10.3 10.7*  HGB 9.8* 10.0* 10.2* 9.2*  HCT 28.6* 31.2* 30.0* 27.2*  MCV 94.1  --  92.6 93.2  PLT 120*  --  123* 99*     Coagulation:  Recent Labs Lab 07/23/13 1851  INR 1.07    Radiology: US Renal  07/25/2013   CLINICAL DATA:  Chronic renal failure with superimposed acute renal failure.  EXAM: RENAL/URINARY TRACT ULTRASOUND COMPLETE  COMPARISON:  None.  FINDINGS: Right Kidney:  Length: 12.5 cm. Echogenicity within normal limits where visualized.  There is shadowing from the adjacent gallbladder area and. No mass or hydronephrosis visualized.  Left Kidney:  Length: 12.1 cm. There is mild left-sided hydronephrosis of uncertain etiology. No parenchymal mass in the left kidney is demonstrated.  Bladder:  The urinary bladder is decompressed with a Foley catheter.  IMPRESSION: 1. There is mild left-sided hydronephrosis. 2. The right kidney appears normal where visualized. There is shadowing from the adjacent gallbladder. A CT scan of the abdomen performed in September 2014 revealed layering radiodense bile.   Electronically Signed   By: David  [place]   On: 07/25/2013 15:36   Dg Chest Port 1 View  07/25/2013   CLINICAL DATA:  Fever.  EXAM: PORTABLE CHEST - 1 VIEW  COMPARISON:  07/23/2013.  FINDINGS: The heart is enlarged but stable. There is stable tortuosity, ectasia and calcification of the thoracic aorta. Stable surgical changes from bypass surgery. Low lung volumes with mild vascular crowding and streaky subsegmental bibasilar atelectasis. No definite infiltrates or effusions. The bony thorax is intact.  IMPRESSION: Low lung volumes with subsegmental basilar atelectasis.   Electronically Signed   By: Loralie Champagne M.D.   On: 07/25/2013 20:43   Dg Hip Portable 1 View Left  07/25/2013   CLINICAL DATA:  Portable view status post left hip arthroplasty  EXAM: PORTABLE LEFT HIP - 1 VIEW  COMPARISON:  DG HIP COMPLETE*L* dated 07/23/2013  FINDINGS: The initial film reveals the presence of the femoral component of the prosthesis. Subsequently the shaft of the femoral head component is demonstrated. Subsequent to that of the acetabular cup is visible. There is no immediate postprocedure complication demonstrated.  IMPRESSION: The patient has undergone left total hip joint prosthesis placement without evidence of immediate postprocedure complication.   Electronically Signed   By: David  [place]   On: 07/25/2013 11:43     Medications:   Scheduled Medications: .  albuterol  2.5 mg Nebulization Q6H  . atorvastatin  20 mg Oral QHS  . cefTRIAXone (ROCEPHIN)  IV  1 g Intravenous Q24H  . citalopram  20 mg Oral Daily  . enoxaparin (LOVENOX) injection  40 mg Subcutaneous Q24H  . gabapentin  100 mg Oral TID  . insulin aspart  0-5 Units Subcutaneous QHS  . insulin aspart  0-9 Units Subcutaneous TID WC  . insulin detemir  25 Units Subcutaneous Daily  . pantoprazole  40 mg Oral BID  . tamsulosin  0.4 mg Oral Daily    Infusions: . sodium chloride 50 mL/hr at 07/25/13 1445    PRN Medications: acetaminophen, HYDROcodone-acetaminophen, magnesium hydroxide, methocarbamol, morphine injection   Assessment and Plan:   1. CAD triple vessel with Hx of CABG: He appears stable from CV standpoint this am assessment. No complaints of chest pain. Complaints of post-operative pain of the hip and also of left shoulder from mechanical fall. Will repeat EKG today. Heart rates in the 70's and 80's. Would not place on BB at this time as HR may be related to pain. He is normally in the 60's off BB.   2. S/P Left Hip repair in the setting of mechanical fall: Post op day #1. Followed by Dr. Hilda LiasKeeling and has initial PT.  3. Acute on Chronic Renal Failure:  Creatinine 3.29. Nephrology consultation has been requested. Renal ultrasound demonstrated mild left sided hydronephrosis. No ACE inhibitor is recommended at this time.   Bettey MareKathryn M. Lyman BishopLawrence NP [facility] 07/26/2013, 10:02 AM   Attending note:  Patient seen and examined. Now status post bipolar left hip replacement with Dr. Hilda LiasKeeling. Has remained hemodynamically stable from a cardiac perspective. Followup ECG ordered. Would stop IV fluids. Resume aspirin when able, continue Lipitor. ACE inhibitor and Lasix have been on hold pending further nephrology evaluation of his renal failure.  Jonelle SidleSamuel G. Jasey Cortez, M.D., F.A.C.C.

## 2013-07-26 NOTE — Op Note (Signed)
[name], [name]             ACCOUNT NO.:  [id number]  MEDICAL RECORD NO.:  [id number]  LOCATION:  A317                          FACILITY:  [facility]  PHYSICIAN:  J. Darreld Mclean, M.D. DATE OF BIRTH:  09-12-1933  DATE OF PROCEDURE: DATE OF DISCHARGE:                              OPERATIVE REPORT   PREOPERATIVE DIAGNOSIS:  Femoral neck fracture on the left, displaced.  POSTOPERATIVE DIAGNOSIS:  Femoral neck fracture on the left, displaced.  PROCEDURE:  Bipolar hip replacement on the left using a #16 stem, 55 head, 0 neck press-fit.  One large Hemovac drain.  ANESTHESIA:  Spinal.  ESTIMATED BLOOD LOSS:  650 mL, 1 unit replaced, 2000 units of fluid in addition.  SURGEON:  J. Darreld Mclean, M.D.  ASSISTANTS:  Earl Many, RN; Noe Gens, RN  Abduction pillow placed at the end of the procedure.  INDICATION:  The patient fell at his home on ice sustaining an injury of his hip.  He was admitted per the hospitalist.  Seen yesterday by the cardiologist, found to be at increased risk, but accepted for surgery of his hip.  I previously explained the risks and imponderables of the procedure including infection, embolus, nerve injury, possible need for blood transfusion, need for physical therapy, need for post hospitalization skilled nursing unit, and potential anesthesia risks. He appeared to understand and agreed to accept the procedure as outlined.  I again repeated this to his wife this morning.  DESCRIPTION OF PROCEDURE:  The patient was seen in the holding area. The left hip was identified as correct surgical site.  I talked to his wife and I talked to the patient prior to surgery.  [name] was placed on the left hip.  The patient was taken to the operating room, given spinal anesthesia.  He was placed left side up, right side down decubitus position, held in place by supports.  He was prepped and draped in the usual manner.  We had a generalized time-out identifying  [name] and we are doing his left hip for bipolar hip replacement.  All instrumentation were properly positioned and working.  Everyone knew each other.  Had been prepped and draped, incision was made in the posterior approach.  The patient had considerable amount of adipose tissue.  It is a rather difficult case.  The patient is a very large individual. Muscles were split.  Sciatic nerve was identified and protected for his Penrose drain throughout the remainder of the case.  Short rotators were identified and intact.  He is a large gentleman and had a rather deep hole which made the surgery difficult but with careful dissection, the planes developed very nicely and we could see.  Femoral head was removed with a corkscrew.  Femoral head measured to be 12 mm.  Femoral neck was prepared and then used with the reamers and then a rasp.  The #15 rasp was place and I got an x-ray to ascertain alignment and position.  I then made it little larger then used a 16 rasp to accommodate a permanent stem with a 0 neck and a 55 bipolar hip head.  This was reduced.  Range of motion was good, it was stable.  X-rays look good.  Hemovac drain was placed on #2-0 silk suture.  Short external rotators identified with previously applied chromic ties were tied and reapproximated.  The sciatic nerve looked good.  The Penrose drain was removed.  The fascial layer was reapproximated using #1 Surgilon suture in a figure-of-eight fashion.  Subcutaneous tissue was closed in layers because of the large amount of adipose tissue with 2-0 plain and skin was re approximated with skin staples.  Sterile dressing applied.  It should be noted it is rather a difficult case.  It took a while.  He has a considerable amount of adipose tissue.  He is a very large individual.  Deep cavity.  Meticulous and careful dissection and care was done with the surgery which progressed nicely but took longer than usual.  The patient will go to  the recovery room and then back to the floor.  He received 1 unit of blood during the surgery.          ______________________________ Shela CommonsJ. Darreld McleanWayne Elodia Haviland, M.D.     JWK/MEDQ  D:  07/25/2013  T:  07/26/2013  Job:  [id number]

## 2013-07-26 NOTE — Addendum Note (Signed)
Addendum created 07/26/13 1233 by Despina Hiddenobert J Brae Gartman, CRNA   Modules edited: Notes Section   Notes Section:  File: [id number]

## 2013-07-26 NOTE — Evaluation (Addendum)
Physical Therapy Evaluation Patient Details Name: [name] MRN: [id number] DOB: January 20, 1934 Today's Date: 07/26/2013 Time: 0925-1010 PT Time Calculation (min): 35 min  PT Assessment / Plan / Recommendation History of Present Illness  [name] is a 80 y.o. male with a past medical history of coronary artery disease, status post CABG in 2003 for severe triple-vessel disease, diabetes on insulin, hypertension, chronic kidney disease, who was in his usual state of health earlier today when he was walking outside. He slipped and fell, and experienced excruciating pain in the left hip. EMS was called and the patient was brought into the hospital.  He sustained a Lt hip fracture and had a bipolar replacment on 07/25/2013. Doesn't climb stairs. According to the wife he doesn't really do much.   Clinical Impression  Pt is a 80 yo male who fx his Lt hip.  Pt normally uses a cane to ambulate with and lives at home with his wife.  Pt states he walks minimally in the house with his cane.  He goes outside gets on his tractor and drives down to the pen to feed his chickens.      PT Assessment  Patient needs continued PT services    Follow Up Recommendations  SNF    Does the patient have the potential to tolerate intense rehabilitation    no  Barriers to Discharge  mobility       Equipment Recommendations  None recommended by PT    Recommendations for Other Services   OT  Frequency Min 6X/week    Precautions / Restrictions Precautions Precautions: Fall;Posterior Hip Restrictions Weight Bearing Restrictions: Yes LLE Weight Bearing: Touchdown weight bearing   Pertinent Vitals/Pain 4/10      Mobility  Bed Mobility Overal bed mobility: Needs Assistance Bed Mobility: Supine to Sit;Sit to Supine Supine to sit: Max assist Sit to supine: Max assist Transfers General transfer comment: not tested as pt stated that he felt as if he was going to pass out.    Exercises Total Joint  Exercises Ankle Circles/Pumps: AROM;Both;10 reps Quad Sets: AROM;Both;10 reps Gluteal Sets: AROM;Both;10 reps Heel Slides: Both;10 reps;AAROM Hip ABduction/ADduction: AAROM;10 reps Bridges: AROM;Right;5 reps   PT Diagnosis: Difficulty walking;Generalized weakness;Acute pain  PT Problem List: Decreased strength;Decreased activity tolerance;Decreased balance;Pain;Decreased mobility;Decreased knowledge of use of DME;Decreased knowledge of precautions PT Treatment Interventions: Gait training;Therapeutic exercise;Functional mobility training;Patient/family education;Neuromuscular re-education     PT Goals(Current goals can be found in the care plan section) Acute Rehab PT Goals Patient Stated Goal: to be able to walk again PT Goal Formulation: With patient Time For Goal Achievement: 07/28/13 Potential to Achieve Goals: Good  Visit Information  Last PT Received On: 07/26/13 History of Present Illness: [name] is a 80 y.o. male with a past medical history of coronary artery disease, status post CABG in 2003 for severe triple-vessel disease, diabetes on insulin, hypertension, chronic kidney disease, who was in his usual state of health earlier today when he was walking outside. He slipped and fell, and experienced excruciating pain in the left hip. EMS was called and the patient was brought into the hospital.  He sustained a Lt hip fracture and had a bipolar replacment on 07/25/2013. Doesn't climb stairs. According to the wife he doesn't really do much.        Prior Functioning  Home Living Family/patient expects to be discharged to:: Private residence Living Arrangements: Spouse/significant other;Children Available Help at Discharge: Family Type of Home: House Home Access: Ramped entrance  Home Layout: One level Home Equipment: Walker - 2 wheels Prior Function Level of Independence: Independent with assistive device(s) Communication Communication: No difficulties Dominant  Hand: Right    Cognition  Cognition Arousal/Alertness: Awake/alert Overall Cognitive Status: Within Functional Limits for tasks assessed    Extremity/Trunk Assessment Upper Extremity Assessment Upper Extremity Assessment: Defer to OT evaluation Lower Extremity Assessment Lower Extremity Assessment: Generalized weakness Cervical / Trunk Assessment Cervical / Trunk Assessment: Normal   Balance    End of Session PT - End of Session Activity Tolerance: Patient limited by fatigue Patient left: in bed;with call bell/phone within reach;with family/visitor present  GP     Tyce Delcid,CINDY 07/26/2013, 10:14 AM

## 2013-07-26 NOTE — Consult Note (Signed)
[name] MRN: [id number] DOB/AGE: 1934/02/21 80 y.o. Primary Care Physician:SHAH,AMIT, MD Admit date: 07/23/2013 Chief Complaint:  Chief Complaint  Patient presents with  . Fall   HPI: Pt is 80 year old male with past medica hx of CAD, S/p CABG who presented to Er with c/o fall.  HPI dates back to 07/23/13 when pt had a  fall and later had  sudden on pain in left hip. On evaluation in ER pt was found to have femur fracture.On 07/25/13 pt underwent left hip arthroplasty. Pt was later found to have  Higher creatinine than his baseline and nephrology was consulted. Pt today offers no c/o chest pain No c/o dyspnea No c/o fever/cough/chills  NO c/o hematuria  no c/o freq /urgency/dysuria.    Past Medical History  Diagnosis Date  . Hypertension   . Hyperlipidemia   . Diabetes mellitus, type II, insulin dependent   . Coronary atherosclerosis of native coronary artery 2003    Multivessel s/p CABG  . Morbid obesity   . GERD (gastroesophageal reflux disease)   . History of tobacco abuse   . Back pain   . Hard of hearing   . Aortic root dilatation   . CKD (chronic kidney disease) stage 4, GFR 15-29 ml/min         Family History  Problem Relation Age of Onset  . Heart disease Father   . Heart attack Child     Social History:  reports that he quit smoking about 13 years ago. His smoking use included Cigarettes. He has a 60 pack-year smoking history. He does not have any smokeless tobacco history on file. He reports that he does not drink alcohol or use illicit drugs.   Allergies: No Known Allergies  Medications Prior to Admission  Medication Sig Dispense Refill  . aspirin 81 MG tablet Take 81 mg by mouth daily.      Marland Kitchen atorvastatin (LIPITOR) 20 MG tablet Take 20 mg by mouth at bedtime.      . citalopram (CELEXA) 20 MG tablet Take 20 mg by mouth daily.      . furosemide (LASIX) 20 MG tablet Take 20 mg by mouth daily.      Marland Kitchen gabapentin (NEURONTIN) 100 MG capsule Take 100  mg by mouth 3 (three) times daily.      Marland Kitchen HYDROcodone-acetaminophen (NORCO/VICODIN) 5-325 MG per tablet Take 2 tablets by mouth every 6 (six) hours as needed for pain.      Marland Kitchen insulin detemir (LEVEMIR) 100 UNIT/ML injection Inject 50 Units into the skin daily.      Marland Kitchen lisinopril (PRINIVIL,ZESTRIL) 10 MG tablet Take 10 mg by mouth daily.        Marland Kitchen omeprazole (PRILOSEC) 20 MG capsule Take 1 capsule (20 mg total) by mouth 2 (two) times daily.  60 capsule  6  . pioglitazone (ACTOS) 45 MG tablet Take 45 mg by mouth daily.      . potassium chloride (MICRO-K) 10 MEQ CR capsule Take 10 mEq by mouth daily.      . Tamsulosin HCl (FLOMAX) 0.4 MG CAPS Take 0.4 mg by mouth daily.           WUJ:WJXBJ from the symptoms mentioned above,there are no other symptoms referable to all systems reviewed.  Marland Kitchen albuterol  2.5 mg Nebulization Q6H  . atorvastatin  20 mg Oral QHS  . cefTRIAXone (ROCEPHIN)  IV  1 g Intravenous Q24H  . citalopram  20 mg Oral Daily  . [START ON  07/27/2013] enoxaparin (LOVENOX) injection  30 mg Subcutaneous Q24H  . gabapentin  100 mg Oral TID  . insulin aspart  0-5 Units Subcutaneous QHS  . insulin aspart  0-9 Units Subcutaneous TID WC  . insulin detemir  25 Units Subcutaneous Daily  . pantoprazole  40 mg Oral BID  . tamsulosin  0.4 mg Oral Daily      Physical Exam: Vital signs in last 24 hours: Temp:  [97.4 F (36.3 C)-102.7 F (39.3 C)] 98.1 F (36.7 C) (03/04 1208) Pulse Rate:  [70-97] 73 (03/04 1208) Resp:  [16-20] 20 (03/04 1208) BP: (126-186)/(66-70) 132/67 mmHg (03/04 1208) SpO2:  [74 %-100 %] 97 % (03/04 1353) Weight change:  Last BM Date: 07/23/13  Intake/Output from previous day: 03/03 0701 - 03/04 0700 In: 2890.8 [P.O.:240; I.V.:2315.8; Blood:335] Out: 1840 [Urine:1185; Drains:95; Blood:560]     Physical Exam: General- pt is awake,alert, oriented to time place and person HEENT- hard of hearing, neck supple Resp- No acute REsp distress, wheezing + CVS- S1S2  regular in rate and rhythm GIT- BS+, soft, NT, ND EXT- trace  LE Edema, NO Cyanosis CNS- CN 2-12 grossly intact. Psych- normal mood and affect    Lab Results: CBC  Recent Labs  07/25/13 1826 07/26/13 0522  WBC 10.3 10.7*  HGB 10.2* 9.2*  HCT 30.0* 27.2*  PLT 123* 99*    BMET  Recent Labs  07/25/13 1826 07/26/13 0522  NA 136* 137  K 5.2 5.0  CL 104 105  CO2 21 20  GLUCOSE 215* 214*  BUN 45* 51*  CREATININE 3.09* 3.29*  CALCIUM 8.5 8.2*   Trend Creat  2015  2.9==>3.2 2014  2.2 2011  1.78 2010  1.6--2.58   MICRO Recent Results (from the past 240 hour(s))  SURGICAL PCR SCREEN     Status: None   Collection Time    07/25/13  7:49 AM      Result Value Ref Range Status   MRSA, PCR NEGATIVE  NEGATIVE Final   Staphylococcus aureus NEGATIVE  NEGATIVE Final   Comment:            The Xpert SA Assay (FDA     approved for NASAL specimens     in patients over 78 years of age),     is one component of     a comprehensive surveillance     program.  Test performance has     been validated by [facility] for patients greater     than or equal to 78 year old.     It is not intended     to diagnose infection nor to     guide or monitor treatment.  WOUND CULTURE     Status: None   Collection Time    07/25/13 10:29 AM      Result Value Ref Range Status   Specimen Description WOUND LEFT HIP CAPSULE   Final   Special Requests ANCEF 2gm   Final   Gram Stain     Final   Value: NO WBC SEEN     NO SQUAMOUS EPITHELIAL CELLS SEEN     NO ORGANISMS SEEN     Performed at [facility]   Culture     Final   Value: NO GROWTH 1 DAY     Performed at [facility]   Report Status PENDING   Incomplete  CULTURE, BLOOD (ROUTINE X 2)     Status: None  Collection Time    07/25/13  8:16 PM      Result Value Ref Range Status   Specimen Description BLOOD RIGHT HAND   Final   Special Requests BOTTLES DRAWN AEROBIC AND ANAEROBIC 6CC EACH   Final   Culture NO  GROWTH 1 DAY   Final   Report Status PENDING   Incomplete  CULTURE, BLOOD (ROUTINE X 2)     Status: None   Collection Time    07/25/13  8:16 PM      Result Value Ref Range Status   Specimen Description BLOOD RIGHT ANTECUBITAL   Final   Special Requests BOTTLES DRAWN AEROBIC AND ANAEROBIC 4CC EACH   Final   Culture NO GROWTH 1 DAY   Final   Report Status PENDING   Incomplete      Lab Results  Component Value Date   CALCIUM 8.2* 07/26/2013   PHOS 2.9 07/29/2008   Renal U/s  Right Kidney:  Length: 12.5 cm. Echogenicity within normal limits where  visualized. There is shadowing from the adjacent gallbladder area  and. No mass or hydronephrosis visualized.  Left Kidney:  Length: 12.1 cm. There is mild left-sided hydronephrosis of  uncertain etiology. No parenchymal mass in the left kidney is  demonstrated.  Bladder:  The urinary bladder is decompressed with a Foley catheter.  IMPRESSION:  1. There is mild left-sided hydronephrosis.   Impression: 1)Renal  AKI secondary to Multiple factors                   Post renal                   Prerenal and ATN from  Hypotension                   Meds- pt had  Ace on board as outpt               AKI on CKD               AKi worseing                CKD stage 4 .               CKD since 2010               CKD secondary to HTN/ DM/ Morbid obesty/obstruction ( pt has hx of nephrolithiasis)                2)HTN   BP a goal for acute state Target Organ damage  CKD CAD   3)Anemia HGb at goal (9--11)   4)CKD Mineral-Bone Disorder PTH not avail. Secondary Hyperparathyroidism w/u pending. Phosphorus and Vitamin 25-OH will check.  5)Ortho- admitted with Left Hip fracture. S/p Arthroplasty. Ortho and Primary MD following  6)Electrolytes  Normokalemic NOrmonatremic   7)Acid base Co2 at goal     Plan:  Agree with holding anti HTN Agree with holding ace Agree with urology consult  Would suggest to Not give NSAIDS/ other  Nephroptoxics Please check BMet daily Will suggest to check 2d echo to see ef and IVC filling  Will start IVf . Will start lasix to see if it helps     Johnel Yielding S 07/26/2013, 1:58 PM

## 2013-07-26 NOTE — Anesthesia Postprocedure Evaluation (Signed)
  Anesthesia Post-op Note  Patient: [name]  Procedure(s) Performed: Procedure(s): ARTHROPLASTY BIPOLAR HIP (Left)  Patient Location: room 317  Anesthesia Type:Spinal  Level of Consciousness: awake, alert , oriented and patient cooperative  Airway and Oxygen Therapy: Patient Spontanous Breathing  Post-op Pain: 4 /10, mild  Post-op Assessment: Post-op Vital signs reviewed, Patient's Cardiovascular Status Stable, Respiratory Function Stable, Patent Airway, No signs of Nausea or vomiting, Adequate PO intake and Pain level controlled  Post-op Vital Signs: Reviewed and stable  Complications: No apparent anesthesia complications

## 2013-07-26 NOTE — Progress Notes (Signed)
Discussed with dr Hilda Liaskeeling will not do cysto tomorrow he had l hip surgery yesterday

## 2013-07-26 NOTE — Progress Notes (Signed)
Subjective: 1 Day Post-Op Procedure(s) (LRB): ARTHROPLASTY BIPOLAR HIP (Left) Patient reports pain as 4 on 0-10 scale.    Objective: Vital signs in last 24 hours: Temp:  [97.4 F (36.3 C)-102.7 F (39.3 C)] 98.1 F (36.7 C) (03/04 0417) Pulse Rate:  [61-97] 79 (03/04 0417) Resp:  [14-26] 20 (03/04 0417) BP: (119-186)/(47-99) 126/66 mmHg (03/04 0417) SpO2:  [74 %-100 %] 96 % (03/04 0417)  Intake/Output from previous day: 03/03 0701 - 03/04 0700 In: 2890.8 [P.O.:240; I.V.:2315.8; Blood:335] Out: 1840 [Urine:1185; Drains:95; Blood:560] Intake/Output this shift:     Recent Labs  07/24/13 0500 07/25/13 0526 07/25/13 1230 07/25/13 1826 07/26/13 0522  HGB 11.3* 9.8* 10.0* 10.2* 9.2*    Recent Labs  07/25/13 1826 07/26/13 0522  WBC 10.3 10.7*  RBC 3.24* 2.92*  HCT 30.0* 27.2*  PLT 123* 99*    Recent Labs  07/25/13 1826 07/26/13 0522  NA 136* 137  K 5.2 5.0  CL 104 105  CO2 21 20  BUN 45* 51*  CREATININE 3.09* 3.29*  GLUCOSE 215* 214*  CALCIUM 8.5 8.2*    Recent Labs  07/23/13 1851  INR 1.07    Neurologically intact Neurovascular intact Sensation intact distally Intact pulses distally Dorsiflexion/Plantar flexion intact  He had elevated temperature last night.  Chest x-ray showed atelectasis.  He has Respiratory therapy.  I will add incentive spirometry.  He is to have PT today.  Precautions discussed.  I again talked about SNF at discharge.  He is resistant but I feel he will accept.  I have talked to his wife today about it.  I have asked nephrology to see him and talk to family about his renal disease.  They say they did not know he had kidney problems in addition to his other problems.  A member of the family had been on dialysis in the past and they are concerned about [name] possibly needing this as well.  I will remove foley and hemovac tomorrow.   Assessment/Plan: 1 Day Post-Op Procedure(s) (LRB): ARTHROPLASTY BIPOLAR HIP  (Left) Up with therapy  [name] 07/26/2013, 7:24 AM

## 2013-07-26 NOTE — Clinical Social Work Psychosocial (Signed)
Clinical Social Work Department BRIEF PSYCHOSOCIAL ASSESSMENT 07/26/2013  Patient:  [name], [name]     Account Number:  [id number]     Admit date:  07/23/2013  Clinical Social Worker:  Wyatt Haste  Date/Time:  07/26/2013 10:33 AM  Referred by:  CSW  Date Referred:  07/26/2013 Referred for  SNF Placement   Other Referral:   Interview type:  Patient Other interview type:   wife and son    PSYCHOSOCIAL DATA Living Status:  WIFE Admitted from facility:   Level of care:   Primary support name:  Mae Primary support relationship to patient:  SPOUSE Degree of support available:   supportive    CURRENT CONCERNS Current Concerns  Post-Acute Placement   Other Concerns:    SOCIAL WORK ASSESSMENT / PLAN CSW met with pt, pt's wife, and a son at bedside. Pt alert and oriented. He reports he lives with his wife and one son. The couple have 8 living children, several of whom live nearby. Per pt's wife, pt is able to do what he wants and is fairly independent, but is not very active normally. Pt fell in the snow on Sunday and was unable to get up. Wife called EMS and pt admitted with hip fracture. Surgery was yesterday. PT assessed pt this morning and recommendation is for SNF. CSW discussed placement process. Pt very resistant to consider placement. He states, "I don't care about no nursing home." Family feels strongly that pt needs to go. CSW attempted to reassure pt that plan would be for short term rehab. MD also discussed SNF with pt. He is now reluctantly agreeable to initiate bed search at [facility] only for now. SNF list provided.   Assessment/plan status:  Psychosocial Support/Ongoing Assessment of Needs Other assessment/ plan:   Information/referral to community resources:   SNF list    PATIENT'S/FAMILY'S RESPONSE TO PLAN OF CARE: CSW will send referral to [facility] and follow up with pt and wife.       Benay Pike, [place]

## 2013-07-26 NOTE — Progress Notes (Signed)
Inpatient Diabetes Program Recommendations  AACE/ADA: New Consensus Statement on Inpatient Glycemic Control (2013)  Target Ranges:  Prepandial:   less than 140 mg/dL      Peak postprandial:   less than 180 mg/dL (1-2 hours)      Critically ill patients:  140 - 180 mg/dL   Results for [name], [name] (MRN [id number]) as of 07/26/2013 10:04  Ref. Range 07/25/2013 08:04 07/25/2013 12:25 07/25/2013 17:00 07/25/2013 22:17 07/26/2013 08:20  Glucose-Capillary Latest Range: 70-99 mg/dL 409144 (H) 811161 (H) 914244 (H) 283 (H) 264 (H)   Diabetes history: DM2 Outpatient Diabetes medications: Levemir 50 units daily, Actos 45 mg daily Current orders for Inpatient glycemic control: Levemir 25 units daily, Novolog 0-9 units AC, Novolog 0-5 units HS  Inpatient Diabetes Program Recommendations Insulin - Basal: Noted Levemir was not given yesterday due to being on hold for surgery and not rescheduled after surgery.  Therefore, glucose noted to be elevated throughout day yesterday and fasting glucose is 264 mg/dl this morning. No changes recommended at this time.  Will follow.  Thanks, Orlando PennerMarie Kasean Denherder, RN, MSN, CCRN Diabetes Coordinator Inpatient Diabetes Program [phone] (Team Pager) [phone] (AP office) [phone] [facility] office)

## 2013-07-26 NOTE — Progress Notes (Signed)
[facility] PROGRESS NOTE  [name] JXB:[id number] DOB: 09-07-1933 DOA: 07/23/2013 PCP: Georgiann Mohs, MD  Summary:  This is a 80 year old, Caucasian male with hx CKD IV, DM, HTN, CAD  who presented after a mechanical fall and sustained a left hip fracture. Evaluated by cardiology and cleared for surgery. Hip repair on 07/25/13. Worsening renal failure and mild hydronephrosis noted. Urology and nephrology consults requested.   Assessment/Plan:  #1 left hip fracture: s/p arthroplasty left hip. Management per ortho. Continue with pain medications and robaxin for muscle spasm. Good pain control.   #2 known history of CAD, status post CABG: Stable. Continue with his home medications. He is not on a beta blocker for reasons that are not clear. Defer to cardiology in this matter.   #4 acute on chronic kidney disease stage 4: On admission his creatinine was 2.9 and this am 3.2. It was 2.2 in September 2014 and 1.78 in 2011. His UA on admission suggested some dehydration. Provided with fluids with no improvement. US reveals mild left hydronephrosis. Urology and nephrology consults pending. Urine output fair.  Continue to hold his ACE inhibitor and any other nephrotoxins. Monitor output.    #5 diabetes mellitus, type II, on insulin: only fair control.  Continue with his long acting insulin at a lower dose. Today's elevated CBG presumably due to no Levemir yesterday due to surgery. Continue sliding scale coverage. HbA1c 6.6. Monitor. Will provide carb modified diet. Monitor   #6 history of hypertension: controlled. Holding home ACE inhibitor and lasix. Consider resuming home lasix. Monitor blood pressures closely. Treat accordingly.   #7. Left shoulder pain: hx of same since fall last month. Has seen Dr. Hilda Lias for this OP. No PT. Worsening with this fall. Xray without fx. Some visible muscle spasm. Continue   #8: hematuria: microscopic. May be related to insertion of foley. Will monitor.  #9.  Anemia: likely related to chronic disease in setting of recent hip surgery. Received PRBC's after surgery. Trending down somewhat. Hemovac drained 560cc yesterday. Will monitor closely  #10 thrombocytopenia: related to above. See above. Will monitor closely.     Code Status: full Family Communication: son and wife at bedside Disposition Plan: snf when ready hopefully 48 hours   Consultants:  Orthopedics  Nephrology  urology  Procedures:  Left hip arthroplasty  Antibiotics:  Ancef pre-operatively  HPI/Subjective: Reports feeling "rough" this morning. Denies nausea but reports "light headedness when working with physical therapy.   Objective: Filed Vitals:   07/26/13 0417  BP: 126/66  Pulse: 79  Temp: 98.1 F (36.7 C)  Resp: 20    Intake/Output Summary (Last 24 hours) at 07/26/13 1049 Last data filed at 07/26/13 0651  Gross per 24 hour  Intake 1890.83 ml  Output   1480 ml  Net 410.83 ml   Filed Weights   07/23/13 1815 07/23/13 2133  Weight: 109.77 kg (242 lb) 122.199 kg (269 lb 6.4 oz)    Exam:   General:  Well nourished NAD  Cardiovascular: RRR No MGR trace LE edema non-pitting  Respiratory: normal effort BS somewhat distant and course with faint wheeze no crackles  Abdomen: obese soft +BS non-tender to palpation  Musculoskeletal: left hip dressing dry and intact. hemovac intact draining red liquid.    Data Reviewed: Basic Metabolic Panel:  Recent Labs Lab 07/23/13 1851 07/24/13 0500 07/25/13 0526 07/25/13 1826 07/26/13 0522  NA 135* 138 138 136* 137  K 4.2 4.5 4.7 5.2 5.0  CL 99 103 105 104 105  CO2 22 24 22 21 20   GLUCOSE 187* 150* 144* 215* 214*  BUN 40* 41* 43* 45* 51*  CREATININE 2.92* 3.27* 3.11* 3.09* 3.29*  CALCIUM 8.9 8.5 8.5 8.5 8.2*   Liver Function Tests:  Recent Labs Lab 07/23/13 1851  AST 19  ALT 13  ALKPHOS 104  BILITOT 0.4  PROT 6.9  ALBUMIN 3.3*   No results found for this basename: LIPASE, AMYLASE,  in  the last 168 hours No results found for this basename: AMMONIA,  in the last 168 hours CBC:  Recent Labs Lab 07/23/13 1851 07/24/13 0500 07/25/13 0526 07/25/13 1230 07/25/13 1826 07/26/13 0522  WBC 10.9* 8.6 8.6  --  10.3 10.7*  NEUTROABS 8.4*  --  5.7  --  8.5* 7.4  HGB 11.4* 11.3* 9.8* 10.0* 10.2* 9.2*  HCT 34.2* 33.0* 28.6* 31.2* 30.0* 27.2*  MCV 93.4 94.8 94.1  --  92.6 93.2  PLT 172 151 120*  --  123* 99*   Cardiac Enzymes: No results found for this basename: CKTOTAL, CKMB, CKMBINDEX, TROPONINI,  in the last 168 hours BNP (last 3 results) No results found for this basename: PROBNP,  in the last 8760 hours CBG:  Recent Labs Lab 07/25/13 0804 07/25/13 1225 07/25/13 1700 07/25/13 2217 07/26/13 0820  GLUCAP 144* 161* 244* 283* 264*    Recent Results (from the past 240 hour(s))  SURGICAL PCR SCREEN     Status: None   Collection Time    07/25/13  7:49 AM      Result Value Ref Range Status   MRSA, PCR NEGATIVE  NEGATIVE Final   Staphylococcus aureus NEGATIVE  NEGATIVE Final   Comment:            The Xpert SA Assay (FDA     approved for NASAL specimens     in patients over 45 years of age),     is one component of     a comprehensive surveillance     program.  Test performance has     been validated by [employer] for patients greater     than or equal to 45 year old.     It is not intended     to diagnose infection nor to     guide or monitor treatment.  WOUND CULTURE     Status: None   Collection Time    07/25/13 10:29 AM      Result Value Ref Range Status   Specimen Description WOUND LEFT HIP CAPSULE   Final   Special Requests ANCEF 2gm   Final   Gram Stain     Final   Value: NO WBC SEEN     NO SQUAMOUS EPITHELIAL CELLS SEEN     NO ORGANISMS SEEN     Performed at [facility]   Culture     Final   Value: NO GROWTH 1 DAY     Performed at [facility]   Report Status PENDING   Incomplete  CULTURE, BLOOD (ROUTINE X 2)     Status:  None   Collection Time    07/25/13  8:16 PM      Result Value Ref Range Status   Specimen Description BLOOD RIGHT HAND   Final   Special Requests BOTTLES DRAWN AEROBIC AND ANAEROBIC 6CC EACH   Final   Culture NO GROWTH 1 DAY   Final   Report Status PENDING   Incomplete  CULTURE, BLOOD (ROUTINE X  2)     Status: None   Collection Time    07/25/13  8:16 PM      Result Value Ref Range Status   Specimen Description BLOOD RIGHT ANTECUBITAL   Final   Special Requests BOTTLES DRAWN AEROBIC AND ANAEROBIC 4CC EACH   Final   Culture NO GROWTH 1 DAY   Final   Report Status PENDING   Incomplete     Studies: US Renal  07/25/2013   CLINICAL DATA:  Chronic renal failure with superimposed acute renal failure.  EXAM: RENAL/URINARY TRACT ULTRASOUND COMPLETE  COMPARISON:  None.  FINDINGS: Right Kidney:  Length: 12.5 cm. Echogenicity within normal limits where visualized. There is shadowing from the adjacent gallbladder area and. No mass or hydronephrosis visualized.  Left Kidney:  Length: 12.1 cm. There is mild left-sided hydronephrosis of uncertain etiology. No parenchymal mass in the left kidney is demonstrated.  Bladder:  The urinary bladder is decompressed with a Foley catheter.  IMPRESSION: 1. There is mild left-sided hydronephrosis. 2. The right kidney appears normal where visualized. There is shadowing from the adjacent gallbladder. A CT scan of the abdomen performed in September 2014 revealed layering radiodense bile.   Electronically Signed   By: David  [place]   On: 07/25/2013 15:36   Dg Chest Port 1 View  07/25/2013   CLINICAL DATA:  Fever.  EXAM: PORTABLE CHEST - 1 VIEW  COMPARISON:  07/23/2013.  FINDINGS: The heart is enlarged but stable. There is stable tortuosity, ectasia and calcification of the thoracic aorta. Stable surgical changes from bypass surgery. Low lung volumes with mild vascular crowding and streaky subsegmental bibasilar atelectasis. No definite infiltrates or effusions. The bony thorax  is intact.  IMPRESSION: Low lung volumes with subsegmental basilar atelectasis.   Electronically Signed   By: Loralie Champagne M.D.   On: 07/25/2013 20:43   Dg Hip Portable 1 View Left  07/25/2013   CLINICAL DATA:  Portable view status post left hip arthroplasty  EXAM: PORTABLE LEFT HIP - 1 VIEW  COMPARISON:  DG HIP COMPLETE*L* dated 07/23/2013  FINDINGS: The initial film reveals the presence of the femoral component of the prosthesis. Subsequently the shaft of the femoral head component is demonstrated. Subsequent to that of the acetabular cup is visible. There is no immediate postprocedure complication demonstrated.  IMPRESSION: The patient has undergone left total hip joint prosthesis placement without evidence of immediate postprocedure complication.   Electronically Signed   By: David  [place]   On: 07/25/2013 11:43    Scheduled Meds: . albuterol  2.5 mg Nebulization Q6H  . atorvastatin  20 mg Oral QHS  . cefTRIAXone (ROCEPHIN)  IV  1 g Intravenous Q24H  . citalopram  20 mg Oral Daily  . enoxaparin (LOVENOX) injection  40 mg Subcutaneous Q24H  . gabapentin  100 mg Oral TID  . insulin aspart  0-5 Units Subcutaneous QHS  . insulin aspart  0-9 Units Subcutaneous TID WC  . insulin detemir  25 Units Subcutaneous Daily  . pantoprazole  40 mg Oral BID  . tamsulosin  0.4 mg Oral Daily   Continuous Infusions: . sodium chloride 50 mL/hr at 07/25/13 1445    Principal Problem:   Hip fracture, left Active Problems:   DM   HYPERTENSION   CAD   GERD   CKD (chronic kidney disease) stage 4, GFR 15-29 ml/min   Preoperative cardiovascular examination   Acute on chronic renal failure   Left shoulder pain   Hydronephrosis, left  Anemia   Thrombocytopenia, unspecified    Time spent: 35 minutes    [facility] End[facility] Center LPBLACK,KAREN M  [facility] Pager [phone]. If 7PM-7AM, please contact night-coverage at [email or web address], password [email or web address] 07/26/2013, 10:49 AM  LOS: 3 days

## 2013-07-26 NOTE — Clinical Social Work Placement (Signed)
Clinical Social Work Department CLINICAL SOCIAL WORK PLACEMENT NOTE 07/26/2013  Patient:  [name],[name]  Account Number:  [id number] Admit date:  07/23/2013  Clinical Social Worker:  Derenda FennelKARA Jamekia Gannett, LCSW  Date/time:  07/26/2013 10:32 AM  Clinical Social Work is seeking post-discharge placement for this patient at the following level of care:   SKILLED NURSING   (*CSW will update this form in Epic as items are completed)   07/26/2013  [facility]family provided with [facility] of [facility] Work's list of facilities offering this level of care within the geographic area requested by the patient (or if unable, by the patient's family).  07/26/2013  Patient/family informed of their freedom to choose among providers that offer the needed level of care, that participate in Medicare, Medicaid or managed care program needed by the patient, have an available bed and are willing to accept the patient.  07/26/2013  Patient/family informed of [facility]' ownership interest in [facility], as well as of the fact that they are under no obligation to receive care at this facility.  PASARR submitted to EDS on 07/26/2013 PASARR number received from EDS on 07/26/2013  FL2 transmitted to all facilities in geographic area requested by pt/family on  07/26/2013 FL2 transmitted to all facilities within larger geographic area on   Patient informed that his/her managed care company has contracts with or will negotiate with  certain facilities, including the following:     Patient/family informed of bed offers received:   Patient chooses bed at  Physician recommends and patient chooses bed at    Patient to be transferred to  on   Patient to be transferred to facility by   The following physician request were entered in Epic:   Additional Comments:  Derenda FennelKara Kimberlye Dilger, LCSW [phone]

## 2013-07-26 NOTE — Progress Notes (Signed)
Physical Therapy Treatment Patient Details Name: [name] MRN: [id number] DOB: [birth year] Today's Date: 07/26/2013 Time: [id number] PT Time Calculation (min): 40 min 2 TE  PT Assessment / Plan / Recommendation     Patient was somewhat difficult to arouse from sleep initially: Pt completed supine therex with AAROM of LLE. Max A to sit EOB. However this afternoon was able to sit EOB for 5mins with initial c/o of dizziness that lessened but did not disappear. Patient needed to lie back down due to nausea and attempted to vomit. After several rest breaks after small movements towards [facility] eventually completed with Max A  using trapeze/rail/RLE and therapist utilizing mat to pull pt up.  Patient also required rest breaks during [facility] movement due to back pain. Wife states he has had 2 back surgeries and has chronic back pain                                          Progress towards PT Goals Progress towards PT goals: Progressing toward goals  Plan      Precautions / Restrictions         Mobility  Bed Mobility Supine to sit: Max assist Sit to supine: Mod assist General bed mobility comments: patient requiring assistance with LLE to and from bed: Max A to upright trunk with verbal cues to use UE for support and leverage Transfers General transfer comment: Patient complaining of dizziness and nausea;not tested    Exercises General Exercises - Lower Extremity Ankle Circles/Pumps: AROM;Both;15 reps;Supine Quad Sets: AROM;Left;15 reps;Supine Heel Slides: AROM;Both;AAROM;10 reps;Supine Hip ABduction/ADduction: AROM;AAROM;Both;Supine Straight Leg Raises: AROM;Right;10 reps;Supine Other Exercises Other Exercises: static balance EOB x555mins   PT Diagnosis:    PT Problem List:   PT Treatment Interventions:     PT Goals (current goals can now be found in the care plan section)    Visit Information  Last PT Received On: 07/26/13 Assistance Needed: +1    Subjective  Data      Cognition       Balance     End of Session PT - End of Session Activity Tolerance: Patient limited by pain;Patient limited by lethargy   GP     Insiya Oshea ATKINSO 07/26/2013, 4:31 PM

## 2013-07-26 NOTE — Progress Notes (Signed)
Patient seen, independently examined and chart reviewed. I agree with exam, assessment and plan discussed with Toya SmothersKaren Black, NP.  Subjective: Overall feels poorly but no specific complaints. Eating well breakfast and lunch but no dinner. She is to be breathing fine.  Objective: Appears calm and comfortable. Afebrile, vital signs stable. Oxygenation stable on 2 L. Cardiovascular regular rate and rhythm. Respiratory clear to auscultation bilaterally. No wheezes, rales or rhonchi. Normal respiratory effort. Abdomen soft. Psychiatric grossly normal mood and affect. Speech fluent and appropriate. He is hard of hearing. Multiple family members at bedside.  Adequate urine output. +2 L since admission. Capillary blood sugars stable. Renal failure worsening with increasing BUN and creatinine. Hemoglobin has decreased, 9.2. Minimal leukocytosis. Thrombocytopenia present on admission slightly worse. Urine and blood cultures pending. Chest x-ray showed atelectasis. Renal ultrasound showed left-sided hydronephrosis.  Overall he appears clinically stable despite multiple acute medical issues. Management of hip fracture per orthopedics. Acute on chronic renal failure management deferred to nephrology, this seems to be worsening, trial of Lasix and fluids recommended. It is suspect the anemia secondary to surgery. The etiology of his thrombocytopenia is unclear, 172 on admission has now trended down. Most likely secondary to blood loss as the patient received no heparin products until today. Repeat CBC in the morning and follow clinically.  Brendia Sacksaniel Goodrich, MD [facility] [phone]

## 2013-07-27 ENCOUNTER — Encounter (HOSPITAL_COMMUNITY): Payer: Self-pay | Admitting: Orthopaedic Surgery

## 2013-07-27 ENCOUNTER — Encounter (HOSPITAL_COMMUNITY)
Admission: EM | Disposition: A | Discharge status: Skilled Nursing Facility | Payer: Self-pay | Source: Home / Self Care | Attending: Family Medicine

## 2013-07-27 DIAGNOSIS — N1330 Unspecified hydronephrosis: Secondary | ICD-10-CM

## 2013-07-27 DIAGNOSIS — D62 Acute posthemorrhagic anemia: Secondary | ICD-10-CM | POA: Diagnosis not present

## 2013-07-27 LAB — WOUND CULTURE
CULTURE: NO GROWTH
GRAM STAIN: NONE SEEN

## 2013-07-27 LAB — URINE CULTURE
COLONY COUNT: NO GROWTH
CULTURE: NO GROWTH

## 2013-07-27 LAB — PTH, INTACT AND CALCIUM
Calcium, Total (PTH): 7.8 mg/dL — ABNORMAL LOW (ref 8.4–10.5)
PTH: 144.3 pg/mL — ABNORMAL HIGH (ref 14.0–72.0)

## 2013-07-27 LAB — CBC WITH DIFFERENTIAL/PLATELET
Basophils Absolute: 0 10*3/uL (ref 0.0–0.1)
Basophils Relative: 0 % (ref 0–1)
EOS ABS: 0.1 10*3/uL (ref 0.0–0.7)
Eosinophils Relative: 2 % (ref 0–5)
HCT: 24.8 % — ABNORMAL LOW (ref 39.0–52.0)
HEMOGLOBIN: 8.6 g/dL — AB (ref 13.0–17.0)
LYMPHS ABS: 1.1 10*3/uL (ref 0.7–4.0)
Lymphocytes Relative: 13 % (ref 12–46)
MCH: 31.6 pg (ref 26.0–34.0)
MCHC: 34.7 g/dL (ref 30.0–36.0)
MCV: 91.2 fL (ref 78.0–100.0)
Monocytes Absolute: 1.3 10*3/uL — ABNORMAL HIGH (ref 0.1–1.0)
Monocytes Relative: 16 % — ABNORMAL HIGH (ref 3–12)
NEUTROS ABS: 5.8 10*3/uL (ref 1.7–7.7)
NEUTROS PCT: 69 % (ref 43–77)
PLATELETS: 100 10*3/uL — AB (ref 150–400)
RBC: 2.72 MIL/uL — ABNORMAL LOW (ref 4.22–5.81)
RDW: 13.7 % (ref 11.5–15.5)
WBC: 8.4 10*3/uL (ref 4.0–10.5)

## 2013-07-27 LAB — BASIC METABOLIC PANEL
BUN: 51 mg/dL — ABNORMAL HIGH (ref 6–23)
CALCIUM: 8.2 mg/dL — AB (ref 8.4–10.5)
CO2: 22 mEq/L (ref 19–32)
Chloride: 103 mEq/L (ref 96–112)
Creatinine, Ser: 2.82 mg/dL — ABNORMAL HIGH (ref 0.50–1.35)
GFR calc Af Amer: 23 mL/min — ABNORMAL LOW (ref >90)
GFR, EST NON AFRICAN AMERICAN: 20 mL/min — AB (ref >90)
GLUCOSE: 173 mg/dL — AB (ref 70–99)
POTASSIUM: 4.5 meq/L (ref 3.7–5.3)
SODIUM: 135 meq/L — AB (ref 137–147)

## 2013-07-27 LAB — MAGNESIUM: Magnesium: 1.4 mg/dL — ABNORMAL LOW (ref 1.5–2.5)

## 2013-07-27 LAB — GLUCOSE, CAPILLARY
GLUCOSE-CAPILLARY: 177 mg/dL — AB (ref 70–99)
Glucose-Capillary: 153 mg/dL — ABNORMAL HIGH (ref 70–99)
Glucose-Capillary: 211 mg/dL — ABNORMAL HIGH (ref 70–99)
Glucose-Capillary: 190 mg/dL — ABNORMAL HIGH (ref 70–99)

## 2013-07-27 LAB — PREPARE RBC (CROSSMATCH)

## 2013-07-27 LAB — VITAMIN D 25 HYDROXY (VIT D DEFICIENCY, FRACTURES): Vit D, 25-Hydroxy: 11 ng/mL — ABNORMAL LOW (ref 30–89)

## 2013-07-27 SURGERY — CYSTOSCOPY, WITH RETROGRADE PYELOGRAM
Anesthesia: Choice | Laterality: Left

## 2013-07-27 MED ORDER — ALBUTEROL SULFATE (2.5 MG/3ML) 0.083% IN NEBU
2.5000 mg | INHALATION_SOLUTION | Freq: Three times a day (TID) | RESPIRATORY_TRACT | Status: AC
  Administered 2013-07-27 – 2013-07-28 (×6): 2.5 mg via RESPIRATORY_TRACT
  Filled 2013-07-27 (×6): qty 3

## 2013-07-27 MED ORDER — ENOXAPARIN SODIUM 60 MG/0.6ML ~~LOC~~ SOLN
60.0000 mg | SUBCUTANEOUS | Status: DC
  Administered 2013-07-28 – 2013-07-31 (×4): 60 mg via SUBCUTANEOUS
  Filled 2013-07-27 (×4): qty 0.6

## 2013-07-27 MED ORDER — VITAMIN D (ERGOCALCIFEROL) 1.25 MG (50000 UNIT) PO CAPS
50000.0000 [IU] | ORAL_CAPSULE | ORAL | Status: DC
  Administered 2013-07-27: 50000 [IU] via ORAL
  Filled 2013-07-27: qty 1

## 2013-07-27 NOTE — Progress Notes (Signed)
Patient seen, independently examined and chart reviewed. I agree with exam, assessment and plan discussed with Toya SmothersKaren Black, NP.  80 year old man presented to the emergency department after he fell at home with resultant left hip pain. Admitted for left hip fracture.  PMH Coronary artery disease with CABG 2003 Diabetes mellitus Chronic kidney disease stage IV  Subjective: Overall seems to be doing better today.  Objective: Afebrile, vital signs are stable. No hypoxia. Appears calm and comfortable. Speech fluent and clear. Cardiovascular regular rate and rhythm. Telemetry sinus rhythm. Respiratory clear to auscultation bilaterally. No wheezes, rales or rhonchi. Normal respiratory effort.  Excellent urine output. I/O. appear balanced. Capillary blood sugars are stable. Kidney function appears somewhat improved today with creatinine 2.82. Magnesium slightly low 1.4. Hemoglobin has decreased, 8.6. Platelet count stable 100. Urine culture no growth. Blood cultures no growth.  Overall he seems to be stable. Continue management of fracture per orthopedics. Progress will be slow in appearance. Coronary artery disease appears stable. Resume aspirin when okay with surgery. Renal failure somewhat better today. Nephrology management appreciated. Followup with urology as an outpatient. Followup 2-D echocardiogram tomorrow. Continue to monitor with cytopenia which appears to be stable and developed prior to initiation of heparin products. Replace magnesium orally  Left-sided hydronephrosis  Brendia Sacksaniel Vielka Klinedinst, MD [facility] [phone]

## 2013-07-27 NOTE — Progress Notes (Signed)
Subjective: 2 Days Post-Op Procedure(s) (LRB): ARTHROPLASTY BIPOLAR HIP (Left) Patient reports pain as 4 on 0-10 scale.    Objective: Vital signs in last 24 hours: Temp:  [98.1 F (36.7 C)-98.7 F (37.1 C)] 98.4 F (36.9 C) (03/05 0541) Pulse Rate:  [73-84] 76 (03/05 0541) Resp:  [20] 20 (03/05 0541) BP: (109-132)/(60-70) 118/70 mmHg (03/05 0541) SpO2:  [94 %-98 %] 97 % (03/05 0721)  Intake/Output from previous day: 03/04 0701 - 03/05 0700 In: 850 [I.V.:500; Blood:350] Out: 3300 [Urine:3300] Intake/Output this shift:     Recent Labs  07/25/13 0526 07/25/13 1230 07/25/13 1826 07/26/13 0522 07/27/13 0440  HGB 9.8* 10.0* 10.2* 9.2* 8.6*    Recent Labs  07/26/13 0522 07/27/13 0440  WBC 10.7* 8.4  RBC 2.92* 2.72*  HCT 27.2* 24.8*  PLT 99* 100*    Recent Labs  07/26/13 0522 07/27/13 0440  NA 137 135*  K 5.0 4.5  CL 105 103  CO2 20 22  BUN 51* 51*  CREATININE 3.29* 2.82*  GLUCOSE 214* 173*  CALCIUM 8.2* 8.2*   No results found for this basename: LABPT, INR,  in the last 72 hours  Neurologically intact Neurovascular intact Sensation intact distally Intact pulses distally Dorsiflexion/Plantar flexion intact Incision: scant drainage  He did just fair with PT yesterday not being able to get OOB secondary to nausea and vomiting but did sit up on side of bed.  He received one unit blood yesterday but will need more transfusions today secondary to blood loss from surgery.  I will transfuse two units today.  His requirement for blood transfusion yesterday was for blood loss and also he was anemic prior to surgery.  PT will work with him today.  He was seen by nephrology yesterday.    Dr. Jerre SimonJavaid would like to do a urology procedure on the patient but that will have to wait about three to four weeks to allow hip to heal better before being put in surgical stirups.    Hemovac drain removed today.  Foley to be removed.   Assessment/Plan: 2 Days Post-Op  Procedure(s) (LRB): ARTHROPLASTY BIPOLAR HIP (Left) Up with therapy  [name] 07/27/2013, 7:28 AM

## 2013-07-27 NOTE — Progress Notes (Signed)
Patient scheduled to have ECHO done today, but patient had just gotten up to chair with PT.  Patient has not been advancing well with mobility, so it is very important to have patient OOB at this time.  ECHO technician spoke with Dr. Kristian CoveyBefekadu about having the ECHO done today VS tomorrow in order to have patient stay OOB for a while.  Dr. Kristian CoveyBefekadu stated that it would be ok for ECHO to be done tomorrow.  RN and ECHO tech in agreement to have patient stay up while he can, and have ECHO preformed tomorrow.

## 2013-07-27 NOTE — Progress Notes (Signed)
Primary care cardiologist: Dr. Rollene Rotunda Consulting cardiologist: Dr. Jonelle Sidle  Subjective:    Patient resting, was up in chair some today. No recent chest pain.  Objective:   Temp:  [97.9 F (36.6 C)-99 F (37.2 C)] 99 F (37.2 C) (03/05 1450) Pulse Rate:  [76-87] 84 (03/05 1450) Resp:  [20] 20 (03/05 1450) BP: (109-161)/(43-70) 118/43 mmHg (03/05 1450) SpO2:  [92 %-99 %] 92 % (03/05 1450) Last BM Date: 07/23/13  Filed Weights   07/23/13 1815 07/23/13 2133  Weight: 242 lb (109.77 kg) 269 lb 6.4 oz (122.199 kg)    Intake/Output Summary (Last 24 hours) at 07/27/13 1508 Last data filed at 07/27/13 1400  Gross per 24 hour  Intake 1482.5 ml  Output   3550 ml  Net -2067.5 ml    Exam:  General: In no distress.  Lungs: No labored breathing.  Cardiac: RRR, no gallop, occasional ectopy.  Extremities: No pitting edema.   Lab Results:  Basic Metabolic Panel:  Recent Labs Lab 07/25/13 1826 07/26/13 0522 07/26/13 1636 07/27/13 0440 07/27/13 0500  NA 136* 137  --  135*  --   K 5.2 5.0  --  4.5  --   CL 104 105  --  103  --   CO2 21 20  --  22  --   GLUCOSE 215* 214*  --  173*  --   BUN 45* 51*  --  51*  --   CREATININE 3.09* 3.29*  --  2.82*  --   CALCIUM 8.5 8.2* 7.8* 8.2*  --   MG  --   --   --   --  1.4*    Liver Function Tests:  Recent Labs Lab 07/23/13 1851  AST 19  ALT 13  ALKPHOS 104  BILITOT 0.4  PROT 6.9  ALBUMIN 3.3*    CBC:  Recent Labs Lab 07/25/13 1826 07/26/13 0522 07/27/13 0440  WBC 10.3 10.7* 8.4  HGB 10.2* 9.2* 8.6*  HCT 30.0* 27.2* 24.8*  MCV 92.6 93.2 91.2  PLT 123* 99* 100*    Coagulation:  Recent Labs Lab 07/23/13 1851  INR 1.07    ECG: Postoperative ECG with nonspecific T-wave changes.   Medications:   Scheduled Medications: . albuterol  2.5 mg Nebulization TID  . atorvastatin  20 mg Oral QHS  . cefTRIAXone (ROCEPHIN)  IV  1 g Intravenous Q24H  . citalopram  20 mg Oral Daily  .  [START ON 07/28/2013] enoxaparin (LOVENOX) injection  60 mg Subcutaneous Q24H  . furosemide  40 mg Intravenous Q12H  . gabapentin  100 mg Oral TID  . insulin aspart  0-5 Units Subcutaneous QHS  . insulin aspart  0-9 Units Subcutaneous TID WC  . insulin detemir  25 Units Subcutaneous Daily  . pantoprazole  40 mg Oral BID  . tamsulosin  0.4 mg Oral Daily  . Vitamin D (Ergocalciferol)  50,000 Units Oral Q7 days     Infusions: . sodium chloride 75 mL/hr at 07/27/13 1106     PRN Medications:  acetaminophen, HYDROcodone-acetaminophen, magnesium hydroxide, methocarbamol, morphine injection   Assessment:   1. Postoperative day #2 status post bipolar left hip following mechanical fall with fracture.  2. History of CAD status post CABG, postoperative ECG stable. Last ischemic workup in February 2014 was low risk. Plan is to continue medical therapy and observation.  3. Acute on chronic renal failure, workup underway per nephrology team. He is off ACE inhibitor, Lasix resumed. Creatinine is  down to 2.8 from 3.2.   Plan/Discussion:    Would resume aspirin when felt to be stable by surgical team. Continues on Lipitor, Lasix was resumed.  Blood pressure and heart rate stable.   Jonelle SidleSamuel G. Nayellie Sanseverino, M.D., F.A.C.C.

## 2013-07-27 NOTE — Progress Notes (Signed)
Physical Therapy Treatment Patient Details Name: [name]: [id number] DOB: 11-Aug-1933 Today's Date: 07/27/2013 Time: [id number] PT Time Calculation (min): 34 min  PT Assessment / Plan / Recommendation  History of Present Illness Pt has been up in a chair for 3 hours and tolerated it well, but is very fatigued at this point.   PT Comments   The chair in which pt sits is too low to the ground for him to try to stand.  The Maxi Move lift was used to return pt to bed.  We worked on therapeutic exercise per THR protocol only.  We reviewed THR precautions.  If pt continues to have significant difficulty standing from sitting tomorrow, he may do well with the SARA lift.  He may be able to take a few steps with it.                                     Progress towards PT Goals Progress towards PT goals: Progressing toward goals  Plan Current plan remains appropriate    Precautions / Restrictions Precautions Precautions: Fall;Posterior Hip Precaution Booklet Issued: Yes (comment) Precaution Comments: posterior hip booklet issued to wife and I explained the overview of THR with pt and wife       Mobility  Bed Mobility Overal bed mobility: Needs Assistance Bed Mobility: Supine to Sit Supine to sit: Max assist General bed mobility comments: pt able to assist only minimally to EOB Transfers Overall transfer level: Needs assistance Equipment used:  (maxi move lift used to get pt back to bed) Transfers: Sit to/from UGI CorporationStand;Stand Pivot Transfers Sit to Stand: Max assist;From elevated surface Stand pivot transfers: +2 safety/equipment General transfer comment: pt was able to come to stance with walker, bed elevated maximally...he was unable to bring trunk into erect position nor was he able to pivot to chair using a walker....we were able to use the bariatric STEDY to move him bed to chair but he still needed bed elevated maximally to achieve this.Marland Kitchen.a sling for the maxi move was  placed in the chair in order to get him back to bed    Exercises Total Joint Exercises Ankle Circles/Pumps: AROM;Both;10 reps;Supine Quad Sets: AROM;Both;10 reps;Supine Gluteal Sets: AROM;Both;10 reps;Supine Short Arc Quad: AAROM;AROM;Both;10 reps;Supine Heel Slides: AAROM;Both;10 reps;Supine Hip ABduction/ADduction: AAROM;Both;10 reps;Supine       Visit Information  Last PT Received On: 07/27/13 History of Present Illness: Pt has been up in a chair for 3 hours and tolerated it well, but is very fatigued at this point.         Cognition  Cognition Arousal/Alertness: Awake/alert Behavior During Therapy: WFL for tasks assessed/performed Overall Cognitive Status: Within Functional Limits for tasks assessed    Balance  Balance Overall balance assessment: History of Falls  End of Session PT - End of Session Equipment Utilized During Treatment: Gait belt Activity Tolerance: Patient limited by fatigue Patient left: in bed;with call bell/phone within reach;with nursing/sitter in room;with family/visitor present Nurse Communication: Mobility status   [name]     [name] 07/27/2013, 1:32 PM

## 2013-07-27 NOTE — Progress Notes (Signed)
Physical Therapy Treatment Patient Details Name: [name] MRN: [id number] DOB: September 03, 1933 Today's Date: 07/27/2013 Time: 0920-1015 PT Time Calculation (min): 55 min  PT Assessment / Plan / Recommendation  History of Present Illness Pt was very concerned about not having any hip pain with mobility.  He was dosed by RN with morphine prior to our visit.     PT Comments   Pt needed maximal encouragement from family and myself in order to participate in PT.  He was finally able to tolerate gentle exercise per THR protocol.  He required max assist to sit at EOB and was able to stand for a short time with a walker but was unable to step to chair.  We required the STEDY lift to transfer bed to chair and the MAXI MOVE lift will need to be used for transfer chair to bed (the pt needs an elevated surface from which to stand).  He was in the chair for about 25 minutes before asking to go back to bed. He was encouraged to try to sit up through lunch.                           Progress towards PT Goals Progress towards PT goals: Progressing toward goals  Plan Current plan remains appropriate    Precautions / Restrictions Precautions Precautions: Fall;Posterior Hip Precaution Booklet Issued: Yes (comment) Precaution Comments: posterior hip booklet issued to wife and I explained the overview of THR with pt and wife Restrictions Weight Bearing Restrictions: Yes LLE Weight Bearing: Touchdown weight bearing       Mobility  Bed Mobility Overal bed mobility: Needs Assistance Bed Mobility: Supine to Sit Supine to sit: Max assist General bed mobility comments: pt able to assist only minimally to EOB Transfers Overall transfer level: Needs assistance Equipment used: Rolling walker (2 wheeled) Transfers: Sit to/from [employer] CorporationStand;Stand Pivot Transfers Sit to Stand: Max assist;From elevated surface Stand pivot transfers: +2 safety/equipment General transfer comment: pt was able to come to stance  with walker, bed elevated maximally...he was unable to bring trunk into erect position nor was he able to pivot to chair using a walker....we were able to use the bariatric STEDY to move him bed to chair but he still needed bed elevated maximally to achieve this.Marland Kitchen.a sling for the maxi move was placed in the chair in order to get him back to bed    Exercises Total Joint Exercises Ankle Circles/Pumps: AROM;Both;10 reps;Supine Quad Sets: AROM;Both;10 reps;Supine Short Arc Quad: AROM;AAROM;Both;10 reps;Supine Heel Slides: AAROM;Both;10 reps;Supine Hip ABduction/ADduction: AAROM;Both;10 reps;Supine    PT Goals (current goals can now be found in the care plan section)    Visit Information  Last PT Received On: 07/27/13 History of Present Illness: Pt was very concerned about not having any hip pain with mobility.  He was dosed by RN with morphine prior to our visit.           Cognition  Cognition Arousal/Alertness: Awake/alert Behavior During Therapy: WFL for tasks assessed/performed Overall Cognitive Status: Within Functional Limits for tasks assessed    Balance  Balance Overall balance assessment: History of Falls  End of Session PT - End of Session Equipment Utilized During Treatment: Gait belt Activity Tolerance: Patient limited by lethargy;Patient limited by pain Patient left: in chair;with call bell/phone within reach;with chair alarm set;with family/visitor present Nurse Communication: Mobility status;Need for lift equipment;Patient requests pain meds   GP     [name], Corey Rehberg L 07/27/2013,  10:52 AM

## 2013-07-27 NOTE — Progress Notes (Signed)
Physical therapy just got patient in the chair. Have to use a lift to move patient. RN requested that I perform echo later to allow patient time up in the chair. Spoke with Dr Kristian CoveyBefekadu, and he advised ok to perform echo tomorrow morning. Echo not urgent and patient needs to sit up for a while. Dr Irene LimboGoodrich said ok to perform echo later as well.  Veda Canningindy Danie Diehl, RDCS

## 2013-07-27 NOTE — Progress Notes (Signed)
Subjective: Interval History: has no complaint of difficulty in breathing. Patient denies any nausea vomiting. Presently he complains of left-sided head pain. His appetite is good..  Objective: Vital signs in last 24 hours: Temp:  [98.1 F (36.7 C)-98.7 F (37.1 C)] 98.4 F (36.9 C) (03/05 0541) Pulse Rate:  [73-84] 76 (03/05 0541) Resp:  [20] 20 (03/05 0800) BP: (109-132)/(60-70) 118/70 mmHg (03/05 0541) SpO2:  [94 %-98 %] 97 % (03/05 0721) Weight change:   Intake/Output from previous day: 03/04 0701 - 03/05 0700 In: 850 [I.V.:500; Blood:350] Out: 3300 [Urine:3300] Intake/Output this shift: Total I/O In: 120 [P.O.:120] Out: -   General appearance: alert, cooperative and no distress Resp: diminished breath sounds posterior - bilateral Cardio: regular rate and rhythm, S1, S2 normal, no murmur, click, rub or gallop GI: soft, non-tender; bowel sounds normal; no masses,  no organomegaly Extremities: edema Trace edema bilaterally  Lab Results:  Recent Labs  07/26/13 0522 07/27/13 0440  WBC 10.7* 8.4  HGB 9.2* 8.6*  HCT 27.2* 24.8*  PLT 99* 100*   BMET:  Recent Labs  07/26/13 0522 07/27/13 0440  NA 137 135*  K 5.0 4.5  CL 105 103  CO2 20 22  GLUCOSE 214* 173*  BUN 51* 51*  CREATININE 3.29* 2.82*  CALCIUM 8.2* 8.2*   No results found for this basename: PTH,  in the last 72 hours Iron Studies: No results found for this basename: IRON, TIBC, TRANSFERRIN, FERRITIN,  in the last 72 hours  Studies/Results: [place] Renal  07/25/2013   CLINICAL DATA:  Chronic renal failure with superimposed acute renal failure.  EXAM: RENAL/URINARY TRACT ULTRASOUND COMPLETE  COMPARISON:  None.  FINDINGS: Right Kidney:  Length: 12.5 cm. Echogenicity within normal limits where visualized. There is shadowing from the adjacent gallbladder area and. No mass or hydronephrosis visualized.  Left Kidney:  Length: 12.1 cm. There is mild left-sided hydronephrosis of uncertain etiology. No parenchymal  mass in the left kidney is demonstrated.  Bladder:  The urinary bladder is decompressed with a Foley catheter.  IMPRESSION: 1. There is mild left-sided hydronephrosis. 2. The right kidney appears normal where visualized. There is shadowing from the adjacent gallbladder. A CT scan of the abdomen performed in September 2014 revealed layering radiodense bile.   Electronically Signed   By: David  [place]   On: 07/25/2013 15:36   Dg Chest Port 1 View  07/25/2013   CLINICAL DATA:  Fever.  EXAM: PORTABLE CHEST - 1 VIEW  COMPARISON:  07/23/2013.  FINDINGS: The heart is enlarged but stable. There is stable tortuosity, ectasia and calcification of the thoracic aorta. Stable surgical changes from bypass surgery. Low lung volumes with mild vascular crowding and streaky subsegmental bibasilar atelectasis. No definite infiltrates or effusions. The bony thorax is intact.  IMPRESSION: Low lung volumes with subsegmental basilar atelectasis.   Electronically Signed   By: Loralie ChampagneMark  Gallerani M.D.   On: 07/25/2013 20:43   Dg Hip Portable 1 View Left  07/25/2013   CLINICAL DATA:  Portable view status post left hip arthroplasty  EXAM: PORTABLE LEFT HIP - 1 VIEW  COMPARISON:  DG HIP COMPLETE*L* dated 07/23/2013  FINDINGS: The initial film reveals the presence of the femoral component of the prosthesis. Subsequently the shaft of the femoral head component is demonstrated. Subsequent to that of the acetabular cup is visible. There is no immediate postprocedure complication demonstrated.  IMPRESSION: The patient has undergone left total hip joint prosthesis placement without evidence of immediate postprocedure complication.   Electronically  Signed   By: David  [place]   On: 07/25/2013 11:43    I have reviewed the patient'Orr current medications.  Assessment/Plan: Problem #1 acute kidney injury his BUN and creatinine presently seems to be improving. Patient presently none oliguric. He doesn't have nausea or vomiting. Problem #2 history of  chronic kidney disease: Possibly early stage IV or late stage III. Etiology was thought to be secondary to diabetes/hypertension/focal segmental nephrosclerosis. Problem #3 history of hypertension his blood pressure is reasonably controlled Problem #4 diabetes Problem #5 history of fracture Problem #6 anemia: His hemoglobin hematocrit is low. At this moment etiology is not clear. Problem #7 metabolic bone disease: Calcium and phosphorus is range. PTH is pending however her her vitamin D level seems to be low. Plan: We'll check her iron studies in the morning We'll continue his present management Will start patient on Ergocalciferol 50,000 international units once a week orally. We'll check her basic metabolic panel in the morning.    LOS: 4 days   [name] 07/27/2013,11:00 AM

## 2013-07-27 NOTE — Progress Notes (Signed)
Inpatient Diabetes Program Recommendations  AACE/ADA: New Consensus Statement on Inpatient Glycemic Control (2013)  Target Ranges:  Prepandial:   less than 140 mg/dL      Peak postprandial:   less than 180 mg/dL (1-2 hours)      Critically ill patients:  140 - 180 mg/dL    Results for [facility]y [facility], [name] (MRN [id number]) as of 07/27/2013 11:59  Ref. Range 07/26/2013 08:20 07/26/2013 11:23 07/26/2013 16:47 07/26/2013 20:20 07/27/2013 07:41 07/27/2013 11:24  Glucose-Capillary Latest Range: 70-99 mg/dL 782264 (H) 956190 (H) 213214 (H) 212 (H) 153 (H) 211 (H)   Diabetes history: DM2  Outpatient Diabetes medications: Levemir 50 units daily, Actos 45 mg daily  Current orders for Inpatient glycemic control: Levemir 25 units daily, Novolog 0-9 units AC, Novolog 0-5 units HS  Inpatient Diabetes Program Recommendations Insulin - Meal Coverage: Please consider ordering Novolog 3 units TID with meals for meal coverage (if patient is eating at least 50% of meals).  Thanks, Orlando PennerMarie Akeyla Molden, RN, MSN, CCRN Diabetes Coordinator Inpatient Diabetes Program [phone] (Team Pager) [phone] (AP office) [phone] [facility] office)

## 2013-07-27 NOTE — Clinical Social Work Note (Signed)
[facility] and [facility] and wife [facility]. Facility notified. Awaiting stability for d/c.   Derenda FennelKara Wylie Russon, LCSW [phone]

## 2013-07-27 NOTE — Clinical Social Work Placement (Signed)
Clinical Social Work Department CLINICAL SOCIAL WORK PLACEMENT NOTE 07/27/2013  Patient:  [name],[name]  Account Number:  [id number] Admit date:  07/23/2013  Clinical Social Worker:  Derenda FennelKARA Danecia Underdown, LCSW  Date/time:  07/26/2013 10:32 AM  Clinical Social Work is seeking post-discharge placement for this patient at the following level of care:   SKILLED NURSING   (*CSW will update this form in Epic as items are completed)   07/26/2013  Patient/family provided with [facility] of Clinical Social Work's list of facilities offering this level of care within the geographic area requested by the patient (or if unable, by the patient's family).  07/26/2013  Patient/family informed of their freedom to choose among providers that offer the needed level of care, that participate in Medicare, Medicaid or managed care program needed by the patient, have an available bed and are willing to accept the patient.  07/26/2013  Patient/family informed of [facility]' ownership interest in [facility], as well as of the fact that they are under no obligation to receive care at this facility.  PASARR submitted to EDS on 07/26/2013 PASARR number received from EDS on 07/26/2013  FL2 transmitted to all facilities in geographic area requested by pt/family on  07/26/2013 FL2 transmitted to all facilities within larger geographic area on   Patient informed that his/her managed care company has contracts with or will negotiate with  certain facilities, including the following:     Patient/family informed of bed offers received:  07/27/2013 [facility] and [facility]  Patient to be transferred to  on   Patient to be transferred to facility by   The following physician request were entered in Epic:   Additional Comments:  Derenda FennelKara Dell Hurtubise, LCSW [phone]

## 2013-07-27 NOTE — Progress Notes (Signed)
[facility] PROGRESS NOTE  ADEEL GUIFFRE ZOX:[id number] DOB: May 17, 1934 DOA: 07/23/2013 PCP: Georgiann Mohs, MD  Summary:  This is a 80 year old, Caucasian male with hx CKD IV, DM, HTN, CAD who presented after a mechanical fall and sustained a left hip fracture. Evaluated by cardiology and cleared for surgery. Hip repair on 07/25/13. Worsening renal failure and mild hydronephrosis noted 07/26/13. Evaluated by  Urology and nephrology. PRBC's 07/27/13 for acute blood loss anemia.  Assessment/Plan: #1 left hip fracture: s/p arthroplasty left hip. Management per ortho. Continue with pain medications and robaxin for muscle spasm. Fair pain control. Pt unable to get OOB yesterday due to dizziness and nausea. Continue to work with PT. May progress slowly. Hemovac removed today.  #2 known history of CAD, status post CABG: Stable. Continue with his home medications. He is not on a beta blocker for reasons that are not clear. Defer to cardiology in this matter.   #4 acute on chronic kidney disease stage 4: On admission his creatinine was 2.9 and jumped to 3.2. It was 2.2 in September 2014 and 1.78 in 2011. His UA on admission suggested some dehydration. Provided with fluids with no improvement. US reveals mild left hydronephrosis. Urology and nephrology consulted. Nephrology opine AKI due to prerenal and ATN from hypotension as well as meds. Gently hydrated and IV lasix started with improvement. Will continue this regimen. Also recommended 2decho and await results.  Continue to hold his ACE inhibitor and any other nephrotoxins. Monitor output.   #5 diabetes mellitus, type II, on insulin: improved control. Continue with his long acting insulin at a lower dose.  Continue sliding scale coverage. HbA1c 6.6. Monitor. Will provide carb modified diet. Appetite good.  Monitor   #6 history of hypertension: controlled. Holding home ACE inhibitor. IV lasix started as above.    #7. Left shoulder pain: hx of same since  fall last month. Has seen Dr. Hilda Lias for this OP. No PT. Worsening with this fall. Xray without fx. Some visible muscle spasm.   #8: hematuria: microscopic. May be related to insertion of foley. Will monitor.  #9. Acute blood loss anemia: related to surgery in setting of anemia of chronic disease. Received PRBC's after surgery. Continues to trend down somewhat. Will be transfused PRBC's today for Hg 8.6. Will monitor closely   #10 thrombocytopenia: ? related to above. See above. Will monitor closely.  #11. Hydronephrosis: mild left. Urology evaluated. Will need cysto but must wait 3-4 weeks given #1. OP follow  Up.    Code Status: full Family Communication: wife and children at bedside Disposition Plan: snf when ready hopefully 24-48 hours   Consultants:  Ortho  Renal  urology  Procedures:  Left hip arthroplasty  Antibiotics:  Ancef pre-operatively  HPI/Subjective: Sitting up in bed eating breakfast. Reports pain left hip.   Objective: Filed Vitals:   07/27/13 0541  BP: 118/70  Pulse: 76  Temp: 98.4 F (36.9 C)  Resp: 20    Intake/Output Summary (Last 24 hours) at 07/27/13 0927 Last data filed at 07/27/13 0542  Gross per 24 hour  Intake    850 ml  Output   3300 ml  Net  -2450 ml   Filed Weights   07/23/13 1815 07/23/13 2133  Weight: 109.77 kg (242 lb) 122.199 kg (269 lb 6.4 oz)    Exam:   General:  Well nourished appears comfortable  Cardiovascular: RRR no m/g/r trace LE edema PPP bilaterally  Respiratory: normal effort BS slightly distant but clear. No wheeze  Abdomen: soft +BS non-tender to palpation  Musculoskeletal:  Left hip incision clean and dry.   Data Reviewed: Basic Metabolic Panel:  Recent Labs Lab 07/24/13 0500 07/25/13 0526 07/25/13 1826 07/26/13 0522 07/26/13 1636 07/27/13 0440 07/27/13 0500  NA 138 138 136* 137  --  135*  --   K 4.5 4.7 5.2 5.0  --  4.5  --   CL 103 105 104 105  --  103  --   CO2 24 22 21 20   --  22   --   GLUCOSE 150* 144* 215* 214*  --  173*  --   BUN 41* 43* 45* 51*  --  51*  --   CREATININE 3.27* 3.11* 3.09* 3.29*  --  2.82*  --   CALCIUM 8.5 8.5 8.5 8.2*  --  8.2*  --   MG  --   --   --   --   --   --  1.4*  PHOS  --   --   --   --  3.6  --   --    Liver Function Tests:  Recent Labs Lab 07/23/13 1851  AST 19  ALT 13  ALKPHOS 104  BILITOT 0.4  PROT 6.9  ALBUMIN 3.3*   No results found for this basename: LIPASE, AMYLASE,  in the last 168 hours No results found for this basename: AMMONIA,  in the last 168 hours CBC:  Recent Labs Lab 07/23/13 1851 07/24/13 0500 07/25/13 0526 07/25/13 1230 07/25/13 1826 07/26/13 0522 07/27/13 0440  WBC 10.9* 8.6 8.6  --  10.3 10.7* 8.4  NEUTROABS 8.4*  --  5.7  --  8.5* 7.4 5.8  HGB 11.4* 11.3* 9.8* 10.0* 10.2* 9.2* 8.6*  HCT 34.2* 33.0* 28.6* 31.2* 30.0* 27.2* 24.8*  MCV 93.4 94.8 94.1  --  92.6 93.2 91.2  PLT 172 151 120*  --  123* 99* 100*   Cardiac Enzymes: No results found for this basename: CKTOTAL, CKMB, CKMBINDEX, TROPONINI,  in the last 168 hours BNP (last 3 results) No results found for this basename: PROBNP,  in the last 8760 hours CBG:  Recent Labs Lab 07/26/13 0820 07/26/13 1123 07/26/13 1647 07/26/13 2020 07/27/13 0741  GLUCAP 264* 190* 214* 212* 153*    Recent Results (from the past 240 hour(s))  SURGICAL PCR SCREEN     Status: None   Collection Time    07/25/13  7:49 AM      Result Value Ref Range Status   MRSA, PCR NEGATIVE  NEGATIVE Final   Staphylococcus aureus NEGATIVE  NEGATIVE Final   Comment:            The Xpert SA Assay (FDA     approved for NASAL specimens     in patients over 78 years of age),     is one component of     a comprehensive surveillance     program.  Test performance has     been validated by [facility] for patients greater     than or equal to 651 year old.     It is not intended     to diagnose infection nor to     guide or monitor treatment.  WOUND CULTURE      Status: None   Collection Time    07/25/13 10:29 AM      Result Value Ref Range Status   Specimen Description WOUND LEFT HIP CAPSULE   Final  Special Requests ANCEF 2gm   Final   Gram Stain     Final   Value: NO WBC SEEN     NO SQUAMOUS EPITHELIAL CELLS SEEN     NO ORGANISMS SEEN     Performed at [facility]   Culture     Final   Value: NO GROWTH 2 DAYS     Performed at [facility]   Report Status 07/27/2013 FINAL   Final  CULTURE, BLOOD (ROUTINE X 2)     Status: None   Collection Time    07/25/13  8:16 PM      Result Value Ref Range Status   Specimen Description BLOOD RIGHT HAND   Final   Special Requests BOTTLES DRAWN AEROBIC AND ANAEROBIC 6CC EACH   Final   Culture NO GROWTH 1 DAY   Final   Report Status PENDING   Incomplete  CULTURE, BLOOD (ROUTINE X 2)     Status: None   Collection Time    07/25/13  8:16 PM      Result Value Ref Range Status   Specimen Description BLOOD RIGHT ANTECUBITAL   Final   Special Requests BOTTLES DRAWN AEROBIC AND ANAEROBIC 4CC EACH   Final   Culture NO GROWTH 1 DAY   Final   Report Status PENDING   Incomplete     Studies: US Renal  07/25/2013   CLINICAL DATA:  Chronic renal failure with superimposed acute renal failure.  EXAM: RENAL/URINARY TRACT ULTRASOUND COMPLETE  COMPARISON:  None.  FINDINGS: Right Kidney:  Length: 12.5 cm. Echogenicity within normal limits where visualized. There is shadowing from the adjacent gallbladder area and. No mass or hydronephrosis visualized.  Left Kidney:  Length: 12.1 cm. There is mild left-sided hydronephrosis of uncertain etiology. No parenchymal mass in the left kidney is demonstrated.  Bladder:  The urinary bladder is decompressed with a Foley catheter.  IMPRESSION: 1. There is mild left-sided hydronephrosis. 2. The right kidney appears normal where visualized. There is shadowing from the adjacent gallbladder. A CT scan of the abdomen performed in September 2014 revealed layering radiodense  bile.   Electronically Signed   By: David  [place]   On: 07/25/2013 15:36   Dg Chest Port 1 View  07/25/2013   CLINICAL DATA:  Fever.  EXAM: PORTABLE CHEST - 1 VIEW  COMPARISON:  07/23/2013.  FINDINGS: The heart is enlarged but stable. There is stable tortuosity, ectasia and calcification of the thoracic aorta. Stable surgical changes from bypass surgery. Low lung volumes with mild vascular crowding and streaky subsegmental bibasilar atelectasis. No definite infiltrates or effusions. The bony thorax is intact.  IMPRESSION: Low lung volumes with subsegmental basilar atelectasis.   Electronically Signed   By: Loralie Champagne M.D.   On: 07/25/2013 20:43   Dg Hip Portable 1 View Left  07/25/2013   CLINICAL DATA:  Portable view status post left hip arthroplasty  EXAM: PORTABLE LEFT HIP - 1 VIEW  COMPARISON:  DG HIP COMPLETE*L* dated 07/23/2013  FINDINGS: The initial film reveals the presence of the femoral component of the prosthesis. Subsequently the shaft of the femoral head component is demonstrated. Subsequent to that of the acetabular cup is visible. There is no immediate postprocedure complication demonstrated.  IMPRESSION: The patient has undergone left total hip joint prosthesis placement without evidence of immediate postprocedure complication.   Electronically Signed   By: David  [place]   On: 07/25/2013 11:43    Scheduled Meds: . albuterol  2.5 mg Nebulization  TID  . atorvastatin  20 mg Oral QHS  . cefTRIAXone (ROCEPHIN)  IV  1 g Intravenous Q24H  . citalopram  20 mg Oral Daily  . enoxaparin (LOVENOX) injection  30 mg Subcutaneous Q24H  . furosemide  40 mg Intravenous Q12H  . gabapentin  100 mg Oral TID  . insulin aspart  0-5 Units Subcutaneous QHS  . insulin aspart  0-9 Units Subcutaneous TID WC  . insulin detemir  25 Units Subcutaneous Daily  . pantoprazole  40 mg Oral BID  . tamsulosin  0.4 mg Oral Daily   Continuous Infusions: . sodium chloride 75 mL/hr at 07/26/13 1705    Principal  Problem:   Hip fracture, left Active Problems:   DM   HYPERTENSION   CAD   GERD   CKD (chronic kidney disease) stage 4, GFR 15-29 ml/min   Preoperative cardiovascular examination   Acute on chronic renal failure   Left shoulder pain   Hydronephrosis, left   Anemia   Thrombocytopenia, unspecified   Postoperative anemia due to acute blood loss    Time spent: 30 minutes    [facility] M  [facility] Pager [phone]. If 7PM-7AM, please contact night-coverage at [email or web address], password [facility] 07/27/2013, 9:27 AM  LOS: 4 days

## 2013-07-28 DIAGNOSIS — I517 Cardiomegaly: Secondary | ICD-10-CM

## 2013-07-28 LAB — TYPE AND SCREEN
ABO/RH(D): O POS
Antibody Screen: NEGATIVE
UNIT DIVISION: 0
UNIT DIVISION: 0
Unit division: 0
Unit division: 0

## 2013-07-28 LAB — BASIC METABOLIC PANEL
BUN: 56 mg/dL — AB (ref 6–23)
CHLORIDE: 102 meq/L (ref 96–112)
CO2: 22 meq/L (ref 19–32)
Calcium: 8.5 mg/dL (ref 8.4–10.5)
Creatinine, Ser: 2.58 mg/dL — ABNORMAL HIGH (ref 0.50–1.35)
GFR calc Af Amer: 26 mL/min — ABNORMAL LOW (ref >90)
GFR calc non Af Amer: 22 mL/min — ABNORMAL LOW (ref >90)
GLUCOSE: 185 mg/dL — AB (ref 70–99)
Potassium: 4.3 mEq/L (ref 3.7–5.3)
Sodium: 137 mEq/L (ref 137–147)

## 2013-07-28 LAB — CBC WITH DIFFERENTIAL/PLATELET
BASOS ABS: 0 10*3/uL (ref 0.0–0.1)
Basophils Relative: 0 % (ref 0–1)
EOS PCT: 2 % (ref 0–5)
Eosinophils Absolute: 0.2 10*3/uL (ref 0.0–0.7)
HEMATOCRIT: 31 % — AB (ref 39.0–52.0)
Hemoglobin: 10.9 g/dL — ABNORMAL LOW (ref 13.0–17.0)
Lymphocytes Relative: 12 % (ref 12–46)
Lymphs Abs: 1 10*3/uL (ref 0.7–4.0)
MCH: 31.8 pg (ref 26.0–34.0)
MCHC: 35.2 g/dL (ref 30.0–36.0)
MCV: 90.4 fL (ref 78.0–100.0)
MONO ABS: 1.2 10*3/uL — AB (ref 0.1–1.0)
MONOS PCT: 14 % — AB (ref 3–12)
Neutro Abs: 6 10*3/uL (ref 1.7–7.7)
Neutrophils Relative %: 72 % (ref 43–77)
Platelets: 124 10*3/uL — ABNORMAL LOW (ref 150–400)
RBC: 3.43 MIL/uL — ABNORMAL LOW (ref 4.22–5.81)
RDW: 13.7 % (ref 11.5–15.5)
WBC: 8.3 10*3/uL (ref 4.0–10.5)

## 2013-07-28 LAB — GLUCOSE, CAPILLARY
GLUCOSE-CAPILLARY: 157 mg/dL — AB (ref 70–99)
Glucose-Capillary: 167 mg/dL — ABNORMAL HIGH (ref 70–99)
Glucose-Capillary: 150 mg/dL — ABNORMAL HIGH (ref 70–99)
Glucose-Capillary: 124 mg/dL — ABNORMAL HIGH (ref 70–99)

## 2013-07-28 MED ORDER — ASPIRIN EC 81 MG PO TBEC
81.0000 mg | DELAYED_RELEASE_TABLET | Freq: Every day | ORAL | Status: DC
  Administered 2013-07-28 – 2013-07-31 (×4): 81 mg via ORAL
  Filled 2013-07-28 (×4): qty 1

## 2013-07-28 MED ORDER — MAGNESIUM OXIDE 400 (241.3 MG) MG PO TABS
400.0000 mg | ORAL_TABLET | Freq: Two times a day (BID) | ORAL | Status: AC
  Administered 2013-07-28 – 2013-07-29 (×2): 400 mg via ORAL
  Filled 2013-07-28 (×2): qty 1

## 2013-07-28 NOTE — Progress Notes (Signed)
Subjective: Interval History: has no complaint of difficulty in breathing. Patient denies any nausea vomiting.   Objective: Vital signs in last 24 hours: Temp:  [97.3 F (36.3 C)-100.1 F (37.8 C)] 97.4 F (36.3 C) (03/06 0544) Pulse Rate:  [70-87] 70 (03/06 0544) Resp:  [20] 20 (03/06 0544) BP: (117-161)/(43-81) 140/81 mmHg (03/06 0544) SpO2:  [88 %-99 %] 98 % (03/06 0704) Weight change:   Intake/Output from previous day: 03/05 0701 - 03/06 0700 In: 1385 [P.O.:360; I.V.:1000; Blood:25] Out: 2350 [Urine:2350] Intake/Output this shift:    General appearance: alert, cooperative and no distress Resp: diminished breath sounds posterior - bilateral Cardio: regular rate and rhythm, S1, S2 normal, no murmur, click, rub or gallop GI: soft, non-tender; bowel sounds normal; no masses,  no organomegaly Extremities: edema Trace edema bilaterally  Lab Results:  Recent Labs  07/27/13 0440 07/28/13 0538  WBC 8.4 8.3  HGB 8.6* 10.9*  HCT 24.8* 31.0*  PLT 100* 124*   BMET:   Recent Labs  07/27/13 0440 07/28/13 0538  NA 135* 137  K 4.5 4.3  CL 103 102  CO2 22 22  GLUCOSE 173* 185*  BUN 51* 56*  CREATININE 2.82* 2.58*  CALCIUM 8.2* 8.5    Recent Labs  07/26/13 1636  PTH 144.3*   Iron Studies: No results found for this basename: IRON, TIBC, TRANSFERRIN, FERRITIN,  in the last 72 hours  Studies/Results: No results found.  I have reviewed the patient's current medications.  Assessment/Plan: Problem #1 acute kidney injury his BUN and creatinine presently seems to be improving. Patient presently none oliguric. He doesn't have nausea or vomiting. Problem #2 history of chronic kidney disease: Possibly early stage IV or late stage III. Etiology was thought to be secondary to diabetes/hypertension/focal segmental nephrosclerosis. Problem #3 history of hypertension his blood pressure is reasonably controlled Problem #4 diabetes Problem #5 history of fracture Problem #6  anemia: His hemoglobin hematocrit is low. At this moment etiology is not clear. Problem #7 metabolic bone disease: Calcium and phosphorus is range. PTH is also high. Presently patient is started on vitamin D supplement. We'll continue his present management We'll check her basic metabolic panel in the morning.    LOS: 5 days   Georjean Toya S 07/28/2013,9:05 AM

## 2013-07-28 NOTE — Progress Notes (Signed)
Subjective: 3 Days Post-Op Procedure(s) (LRB): ARTHROPLASTY BIPOLAR HIP (Left) Patient reports pain as 3 on 0-10 scale.    Objective: Vital signs in last 24 hours: Temp:  [97.3 F (36.3 C)-100.1 F (37.8 C)] 97.4 F (36.3 C) (03/06 0544) Pulse Rate:  [70-87] 70 (03/06 0544) Resp:  [20] 20 (03/06 0544) BP: (117-161)/(43-81) 140/81 mmHg (03/06 0544) SpO2:  [88 %-99 %] 98 % (03/06 0704)  Intake/Output from previous day: 03/05 0701 - 03/06 0700 In: 1385 [P.O.:360; I.V.:1000; Blood:25] Out: 2350 [Urine:2350] Intake/Output this shift:     Recent Labs  07/25/13 1230 07/25/13 1826 07/26/13 0522 07/27/13 0440 07/28/13 0538  HGB 10.0* 10.2* 9.2* 8.6* 10.9*    Recent Labs  07/27/13 0440 07/28/13 0538  WBC 8.4 8.3  RBC 2.72* 3.43*  HCT 24.8* 31.0*  PLT 100* 124*    Recent Labs  07/27/13 0440 07/28/13 0538  NA 135* 137  K 4.5 4.3  CL 103 102  CO2 22 22  BUN 51* 56*  CREATININE 2.82* 2.58*  GLUCOSE 173* 185*  CALCIUM 8.2* 8.5   No results found for this basename: LABPT, INR,  in the last 72 hours  Neurologically intact Neurovascular intact Sensation intact distally Intact pulses distally Dorsiflexion/Plantar flexion intact Incision: scant drainage  He was out of bed about three hours yesterday but did not walk but very little.  Gait will be done today.  He is a very large man and requires assistance.  His pain is less.  HGB up to 10.9 after the two units yesterday.   Assessment/Plan: 3 Days Post-Op Procedure(s) (LRB): ARTHROPLASTY BIPOLAR HIP (Left) Up with therapy  Foy Vanduyne 07/28/2013, 7:28 AM

## 2013-07-28 NOTE — Progress Notes (Signed)
Nutrition Brief Note  RD pulled to chart due to LOS  Wt Readings from Last 15 Encounters:  07/23/13 269 lb 6.4 oz (122.199 kg)  07/23/13 269 lb 6.4 oz (122.199 kg)  07/23/13 269 lb 6.4 oz (122.199 kg)  07/22/12 242 lb (109.77 kg)  02/03/12 270 lb (122.471 kg)  12/10/10 258 lb (117.028 kg)  12/03/10 266 lb (120.657 kg)  10/23/09 260 lb (117.935 kg)    Body mass index is 34.57 kg/(m^2). Patient meets criteria for obesity, class I based on current BMI.   Current diet order is carb modified, patient is consuming approximately 50-75% of meals at this time. Labs and medications reviewed.   No nutrition interventions warranted at this time. If nutrition issues arise, please consult RD.   Kahlan Engebretson A. Mayford KnifeWilliams, RD, LDN Pager: [phone]

## 2013-07-28 NOTE — Progress Notes (Signed)
PROGRESS NOTE  [name]:[id number] DOB: May 02, 1934 DOA: 07/23/2013 PCP: Georgiann Mohs, MD  Summary: 80 year old man presented to the emergency department after he fell at home with resultant left hip pain. Admitted for left hip fracture. Seen in consultation with cardiology, cleared for surgery. Surgery was uneventful. Developed renal failure which is now improving with nephrology management.  Assessment/Plan: 1. Left hip fracture. Status post surgery without complication. Management per orthopedics. Did better today with physical therapy. 2. Acute renal failure superimposed on chronic kidney disease stage IV. Thought to be secondary to ATN, hypotension, prerenal, medications. Now improving with IV fluids and Lasix. ACE inhibitor on hold. 3. Left-sided hydronephrosis. Followup with urology as an outpatient for cystoscopy, cannot be pursued at this point given hip surgery. 4. Acute blood loss anemia. Secondary to surgery. Status post transfusion per orthopedics. 5. Thrombocytopenia. Developed prior to initiation of heparin products. 6. Diabetes mellitus. Hemoglobin A1c 6.6. Stable. Continue present management. 7. History of coronary artery disease. Continue present management. No beta blocker at this point per cardiology. Not currently on ACE inhibitor secondary to renal failure.   Management postop hip surgery per orthopedics.  Continue management of renal failure per nephrology  Replace magnesium  Code Status: full code DVT prophylaxis: Lovenox Family Communication:  Disposition Plan: SNF  Brendia Sacks, MD  [facility]  Pager [phone] If 7PM-7AM, please contact night-coverage at [email or web address], password [facility] 07/28/2013, 3:38 PM  LOS: 5 days   Consultants:  Orthopedics  Cardiology  Nephrology  Procedures:  Transfusion 2 units packed red blood cells  2-D echocardiogram: LVEF 55-60%. Grade 1 diastolic dysfunction.  HPI/Subjective: He feels better today.  Chronic knee pain bilaterally. No new issues.  Objective: Filed Vitals:   07/28/13 0800 07/28/13 1200 07/28/13 1427 07/28/13 1530  BP:   107/59   Pulse:   81   Temp:   97.2 F (36.2 C)   TempSrc:   Oral   Resp: 20 20 18 18   Height:      Weight:      SpO2: 98% 98% 94% 94%    Intake/Output Summary (Last 24 hours) at 07/28/13 1538 Last data filed at 07/28/13 1500  Gross per 24 hour  Intake  872.5 ml  Output   3325 ml  Net -2452.5 ml     Filed Weights   07/23/13 1815 07/23/13 2133  Weight: 109.77 kg (242 lb) 122.199 kg (269 lb 6.4 oz)    Exam:   Afebrile, vital signs stable. No hypoxia.  General. Appears calm and comfortable.  Cardiovascular regular rate and rhythm. No murmur, rub or gallop. 1+ bilateral lower extremity.  Telemetry sinus rhythm  Respiratory clear to auscultation bilaterally. No wheezes, rales or rhonchi. Normal respiratory effort.  Data Reviewed:  Excellent urine output.  Capillary blood sugars stable  Creatinine has improved, to 2.82 >> 2.58  Hemoglobin improved at transfusion, 10.9. Platelet count spontaneously improving, 124.  Scheduled Meds: . albuterol  2.5 mg Nebulization TID  . aspirin EC  81 mg Oral Daily  . atorvastatin  20 mg Oral QHS  . cefTRIAXone (ROCEPHIN)  IV  1 g Intravenous Q24H  . citalopram  20 mg Oral Daily  . enoxaparin (LOVENOX) injection  60 mg Subcutaneous Q24H  . furosemide  40 mg Intravenous Q12H  . gabapentin  100 mg Oral TID  . insulin aspart  0-5 Units Subcutaneous QHS  . insulin aspart  0-9 Units Subcutaneous TID WC  . insulin detemir  25 Units Subcutaneous Daily  . pantoprazole  40 mg Oral BID  . tamsulosin  0.4 mg Oral Daily  . Vitamin D (Ergocalciferol)  50,000 Units Oral Q7 days   Continuous Infusions: . sodium chloride 75 mL/hr at 07/28/13 0510    Principal Problem:   Hip fracture, left Active Problems:   DM   HYPERTENSION   CAD   GERD   CKD (chronic kidney disease) stage 4, GFR 15-29  ml/min   Preoperative cardiovascular examination   Acute on chronic renal failure   Left shoulder pain   Hydronephrosis, left   Anemia   Thrombocytopenia, unspecified   Postoperative anemia due to acute blood loss   Time spent 20  minutes

## 2013-07-28 NOTE — Progress Notes (Signed)
Primary care cardiologist: Dr. Rollene RotundaJames Hochrein Consulting cardiologist: Dr. Jonelle SidleSamuel G. Shatara Stanek  Subjective:    No chest pain or breathlessness. Did get up in a chair some with PT yesterday.  Objective:   Temp:  [97.3 F (36.3 C)-100.1 F (37.8 C)] 97.4 F (36.3 C) (03/06 0544) Pulse Rate:  [70-87] 70 (03/06 0544) Resp:  [20] 20 (03/06 0544) BP: (117-161)/(43-81) 140/81 mmHg (03/06 0544) SpO2:  [88 %-99 %] 98 % (03/06 0704) Last BM Date: 07/23/13  Filed Weights   07/23/13 1815 07/23/13 2133  Weight: 242 lb (109.77 kg) 269 lb 6.4 oz (122.199 kg)    Intake/Output Summary (Last 24 hours) at 07/28/13 0935 Last data filed at 07/28/13 0911  Gross per 24 hour  Intake   1265 ml  Output   2700 ml  Net  -1435 ml    Exam:  General: In no distress.  Lungs: No labored breathing.  Cardiac: RRR, no gallop, occasional ectopy.  Extremities: No pitting edema.   Lab Results:  Basic Metabolic Panel:  Recent Labs Lab 07/26/13 0522 07/26/13 1636 07/27/13 0440 07/27/13 0500 07/28/13 0538  NA 137  --  135*  --  137  K 5.0  --  4.5  --  4.3  CL 105  --  103  --  102  CO2 20  --  22  --  22  GLUCOSE 214*  --  173*  --  185*  BUN 51*  --  51*  --  56*  CREATININE 3.29*  --  2.82*  --  2.58*  CALCIUM 8.2* 7.8* 8.2*  --  8.5  MG  --   --   --  1.4*  --     Liver Function Tests:  Recent Labs Lab 07/23/13 1851  AST 19  ALT 13  ALKPHOS 104  BILITOT 0.4  PROT 6.9  ALBUMIN 3.3*    CBC:  Recent Labs Lab 07/26/13 0522 07/27/13 0440 07/28/13 0538  WBC 10.7* 8.4 8.3  HGB 9.2* 8.6* 10.9*  HCT 27.2* 24.8* 31.0*  MCV 93.2 91.2 90.4  PLT 99* 100* 124*    Coagulation:  Recent Labs Lab 07/23/13 1851  INR 1.07     Medications:   Scheduled Medications: . albuterol  2.5 mg Nebulization TID  . aspirin EC  81 mg Oral Daily  . atorvastatin  20 mg Oral QHS  . cefTRIAXone (ROCEPHIN)  IV  1 g Intravenous Q24H  . citalopram  20 mg Oral Daily  . enoxaparin  (LOVENOX) injection  60 mg Subcutaneous Q24H  . furosemide  40 mg Intravenous Q12H  . gabapentin  100 mg Oral TID  . insulin aspart  0-5 Units Subcutaneous QHS  . insulin aspart  0-9 Units Subcutaneous TID WC  . insulin detemir  25 Units Subcutaneous Daily  . pantoprazole  40 mg Oral BID  . tamsulosin  0.4 mg Oral Daily  . Vitamin D (Ergocalciferol)  50,000 Units Oral Q7 days    Infusions: . sodium chloride 75 mL/hr at 07/28/13 0510    PRN Medications: acetaminophen, HYDROcodone-acetaminophen, magnesium hydroxide, methocarbamol, morphine injection   Assessment:   1. Postoperative day #3 status post bipolar left hip following mechanical fall with fracture.  2. History of CAD status post CABG, postoperative ECG stable. Last ischemic workup in February 2014 was low risk. Plan is to continue medical therapy and observation.  3. Acute on chronic renal failure, workup underway per nephrology team. He is off ACE inhibitor, Lasix resumed. Creatinine  is down to 2.5 from 3.2.   Plan/Discussion:    On aspirin, Lipitor, Lasix at this time. He remains off ACE inhibitor, creatinine has been improving. Heart rate in the 70s to 80s, no clear indication to add a beta blocker at this time. He has been stable from a cardiac perspective. My understanding is that he will be going to an SNF for further rehabilitation prior to home. He can keep regular cardiology followup with Dr. Antoine Poche - can be seen in the [facility] clinic.   Jonelle Sidle, M.D., F.A.C.C.

## 2013-07-28 NOTE — Progress Notes (Signed)
Physical Therapy Treatment Patient Details Name: [name] MRN: [id number] DOB: [birth year] Today's Date: 07/28/2013 Time: [id number] PT Time Calculation (min): 47 min  PT Assessment / Plan / Recommendation  History of Present Illness Pt has no c/o this AM   PT Comments   Pt tolerated therapeutic exercise well, strengt 2/5 in LLE.  Max assist needed to transfer to EOB.  Pt was instructed in posterior THR precautions again.  He was instructed in gait within SARA lift, TTWB left.  He was able to ambulate 6' within the lift mechanism.   The ability to bear weight on both forearms helped him immensely .                               Progress towards PT Goals Progress towards PT goals: Progressing toward goals  Plan Current plan remains appropriate    Precautions / Restrictions Precautions Precautions: Fall;Posterior Hip Restrictions Weight Bearing Restrictions: Yes LLE Weight Bearing: Touchdown weight bearing       Mobility  Bed Mobility Supine to sit: Mod assist General bed mobility comments: pt able now to assist with moving his RLE in the bed as well as minimal motion of the trunk Transfers Equipment used:  (SARA lift without foot platform) Sit to Stand: Max assist General transfer comment: we used the SARA lift for enhancing pt'a ability to transfer to standing...he tolerated this device well and was then able to take a few steps, slowly within the lift Ambulation/Gait Ambulation/Gait assistance: +2 safety/equipment Ambulation Distance (Feet): 6 Feet Assistive device:  (SARA lift) Gait Pattern/deviations: WFL(Within Functional Limits) Gait velocity: WNL given the situation    Exercises Total Joint Exercises Ankle Circles/Pumps: AROM;Both;10 reps;Supine Quad Sets: AROM;Both;10 reps;Supine Gluteal Sets: AROM;Both;10 reps;Supine Short Arc Quad: AAROM;Left;10 reps;Supine Heel Slides: AAROM;Left;10 reps;Supine Hip ABduction/ADduction: AAROM;Left;10 reps;Supine     PT Goals (current goals can now be found in the care plan section)    Visit Information  Last PT Received On: 07/28/13 History of Present Illness: Pt has no c/o this AM       Cognition  Cognition Arousal/Alertness: Awake/alert Behavior During Therapy: WFL for tasks assessed/performed Overall Cognitive Status: Within Functional Limits for tasks assessed        End of Session PT - End of Session Equipment Utilized During Treatment:  (SARA lift) Activity Tolerance: Patient tolerated treatment well Patient left: in chair;with call bell/phone within reach;with chair alarm set Nurse Communication: Mobility status;Need for lift equipment   GP     [name], [name] 07/28/2013, 12:38 PM

## 2013-07-28 NOTE — Progress Notes (Signed)
Physical Therapy Treatment Patient Details Name: Corey BertholdWalter B Orr MRN: [id number] DOB: [birth year] Today's Date: 07/28/2013 Time: 1415-1500 PT Time Calculation (min): 45 min  PT Assessment / Plan / Recommendation  History of Present Illness Pt up in chair x3.5 hours and is reporting fatigue.  Pain in left hip is well controlled.   PT Comments   No gait training this afternoon due to pt fatigue.  He tolerated therapeutic exercise well and did receive transfer training sit to stand within the SARA lift.                                Progress towards PT Goals Progress towards PT goals: Progressing toward goals  Plan Current plan remains appropriate    Precautions / Restrictions Precautions Precautions: Fall Restrictions Weight Bearing Restrictions: Yes LLE Weight Bearing: Touchdown weight bearing       Mobility  Bed Mobility Supine to sit: Mod assist Sit to supine: Mod assist General bed mobility comments: pt able now to assist with moving his RLE in the bed as well as minimal motion of the trunk Transfers Equipment used:  (SARA lift) Transfers: Stand Pivot Transfers Sit to Stand: Max assist Stand pivot transfers: +2 safety/equipment General transfer comment: pt continues to require a lift to transfer from the seated position if not elevated.  He is able to work with the lift device in order to strengthen the hip extensors.  Because of fatigue, we did not have him try to walk again within the lift Ambulation/Gait Ambulation/Gait assistance: +2 safety/equipment Ambulation Distance (Feet): 6 Feet Assistive device:  (SARA lift) Gait Pattern/deviations: WFL(Within Functional Limits) Gait velocity: WNL given the situation    Exercises Total Joint Exercises Ankle Circles/Pumps: AROM;Both;10 reps;Supine Quad Sets: AROM;Both;10 reps;Supine Gluteal Sets: AROM;Both;10 reps;Supine Short Arc Quad: AAROM;Left;10 reps;Supine Heel Slides: AAROM;Left;10 reps;Supine Hip  ABduction/ADduction: AAROM;Left;10 reps;Supine   PT Goals (current goals can now be found in the care plan section)    Visit Information  Last PT Received On: 07/28/13 History of Present Illness: Pt up in chair x3.5 hours and is reporting fatigue.  Pain in left hip is well controlled.    Subjective Data      Cognition  Cognition Arousal/Alertness: Awake/alert Behavior During Therapy: WFL for tasks assessed/performed Overall Cognitive Status: Within Functional Limits for tasks assessed        End of Session PT - End of Session Equipment Utilized During Treatment:  (SARA lift) Activity Tolerance: Patient tolerated treatment well Patient left: in bed;with call bell/phone within reach;with bed alarm set Nurse Communication: Mobility status;Need for lift equipment   GP     Konrad PentaBrown, Steffi Noviello L 07/28/2013, 3:04 PM

## 2013-07-28 NOTE — Progress Notes (Signed)
*  PRELIMINARY RESULTS* Echocardiogram 2D Echocardiogram has been performed.  Jawad Wiacek 07/28/2013, 8:02 AM

## 2013-07-28 NOTE — Clinical Social Work Note (Signed)
CSW spoke with MD regarding pt. Probable d/c this weekend. Facility notified and agreeable. Request call Saturday to provide update [phone]). [facility] will notify [facility].  Derenda FennelKara Zoriah Pulice, [place] [phone]

## 2013-07-29 DIAGNOSIS — D649 Anemia, unspecified: Secondary | ICD-10-CM

## 2013-07-29 LAB — GLUCOSE, CAPILLARY
GLUCOSE-CAPILLARY: 159 mg/dL — AB (ref 70–99)
Glucose-Capillary: 123 mg/dL — ABNORMAL HIGH (ref 70–99)
Glucose-Capillary: 139 mg/dL — ABNORMAL HIGH (ref 70–99)
Glucose-Capillary: 186 mg/dL — ABNORMAL HIGH (ref 70–99)

## 2013-07-29 LAB — CBC
HEMATOCRIT: 31.1 % — AB (ref 39.0–52.0)
HEMOGLOBIN: 10.8 g/dL — AB (ref 13.0–17.0)
MCH: 31.3 pg (ref 26.0–34.0)
MCHC: 34.7 g/dL (ref 30.0–36.0)
MCV: 90.1 fL (ref 78.0–100.0)
Platelets: 153 10*3/uL (ref 150–400)
RBC: 3.45 MIL/uL — ABNORMAL LOW (ref 4.22–5.81)
RDW: 13.5 % (ref 11.5–15.5)
WBC: 6.9 10*3/uL (ref 4.0–10.5)

## 2013-07-29 LAB — BASIC METABOLIC PANEL WITH GFR
BUN: 54 mg/dL — ABNORMAL HIGH (ref 6–23)
CO2: 23 meq/L (ref 19–32)
Calcium: 8.7 mg/dL (ref 8.4–10.5)
Chloride: 98 meq/L (ref 96–112)
Creatinine, Ser: 2.29 mg/dL — ABNORMAL HIGH (ref 0.50–1.35)
GFR calc Af Amer: 30 mL/min — ABNORMAL LOW (ref >90)
GFR calc non Af Amer: 26 mL/min — ABNORMAL LOW (ref >90)
Glucose, Bld: 129 mg/dL — ABNORMAL HIGH (ref 70–99)
Potassium: 3.6 meq/L — ABNORMAL LOW (ref 3.7–5.3)
Sodium: 136 meq/L — ABNORMAL LOW (ref 137–147)

## 2013-07-29 LAB — FERRITIN: FERRITIN: 740 ng/mL — AB (ref 22–322)

## 2013-07-29 LAB — MAGNESIUM: Magnesium: 1.7 mg/dL (ref 1.5–2.5)

## 2013-07-29 LAB — IRON AND TIBC
Iron: 19 ug/dL — ABNORMAL LOW (ref 42–135)
Saturation Ratios: 11 % — ABNORMAL LOW (ref 20–55)
TIBC: 171 ug/dL — ABNORMAL LOW (ref 215–435)
UIBC: 152 ug/dL (ref 125–400)

## 2013-07-29 NOTE — Progress Notes (Signed)
Pt complaining of chest pain this AM. Pt 's VS are stable (97.2, 140/70, 79, 20, 98% on RA), denies SOB, denies dyspnea, pt is not diaphoretic. MD notified. Will give pain pill and obtain EKG. Will continue to monitor.

## 2013-07-29 NOTE — Progress Notes (Signed)
Subjective: 4 Days Post-Op Procedure(s) (LRB): ARTHROPLASTY BIPOLAR HIP (Left) Patient reports pain as mild.    Objective: Vital signs in last 24 hours: Temp:  [97.2 F (36.2 C)-98.5 F (36.9 C)] 98.3 F (36.8 C) (03/07 0619) Pulse Rate:  [70-83] 70 (03/07 0619) Resp:  [18-20] 20 (03/07 0619) BP: (107-140)/(59-77) 140/77 mmHg (03/07 0619) SpO2:  [91 %-98 %] 97 % (03/07 0619) FiO2 (%):  [21 %] 21 % (03/06 1929)  Intake/Output from previous day: 03/06 0701 - 03/07 0700 In: 360 [P.O.:360] Out: 1925 [Urine:1925] Intake/Output this shift:     Recent Labs  07/27/13 0440 07/28/13 0538 07/29/13 0551  HGB 8.6* 10.9* 10.8*    Recent Labs  07/28/13 0538 07/29/13 0551  WBC 8.3 6.9  RBC 3.43* 3.45*  HCT 31.0* 31.1*  PLT 124* 153    Recent Labs  07/28/13 0538 07/29/13 0551  NA 137 136*  K 4.3 3.6*  CL 102 98  CO2 22 23  BUN 56* 54*  CREATININE 2.58* 2.29*  GLUCOSE 185* 129*  CALCIUM 8.5 8.7   No results found for this basename: LABPT, INR,  in the last 72 hours  Neurologically intact Neurovascular intact Sensation intact distally Intact pulses distally Dorsiflexion/Plantar flexion intact Incision: no drainage  He did better in PT yesterday but he is still weak and slow going.   His labs have improved.  Hopefully he can go to SNF on Monday.  His pain is well controlled.  Assessment/Plan: 4 Days Post-Op Procedure(s) (LRB): ARTHROPLASTY BIPOLAR HIP (Left) Up with therapy  Jody Silas 07/29/2013, 8:10 AM

## 2013-07-29 NOTE — Progress Notes (Signed)
Corey BertholdWalter B Orr  MRN: [id number]  DOB/AGE: 78/15/1935 78 y.o.  Primary Care Physician:SHAH,AMIT, MD  Admit date: 07/23/2013  Chief Complaint:  Chief Complaint  Patient presents with  . Fall    S-Pt presented on  07/23/2013 with  Chief Complaint  Patient presents with  . Fall  .    Pt today feels better. Pt offers no new complaints.   Meds . aspirin EC  81 mg Oral Daily  . atorvastatin  20 mg Oral QHS  . citalopram  20 mg Oral Daily  . enoxaparin (LOVENOX) injection  60 mg Subcutaneous Q24H  . furosemide  40 mg Intravenous Q12H  . gabapentin  100 mg Oral TID  . insulin aspart  0-5 Units Subcutaneous QHS  . insulin aspart  0-9 Units Subcutaneous TID WC  . insulin detemir  25 Units Subcutaneous Daily  . pantoprazole  40 mg Oral BID  . tamsulosin  0.4 mg Oral Daily  . Vitamin D (Ergocalciferol)  50,000 Units Oral Q7 days      Physical Exam: Vital signs in last 24 hours: Temp:  [97.2 F (36.2 C)-98.5 F (36.9 C)] 98.3 F (36.8 C) (03/07 0619) Pulse Rate:  [70-83] 70 (03/07 0619) Resp:  [18-20] 20 (03/07 0800) BP: (107-140)/(59-77) 140/77 mmHg (03/07 0619) SpO2:  [91 %-98 %] 97 % (03/07 0800) FiO2 (%):  [21 %] 21 % (03/06 1929) Weight change:  Last BM Date: 07/28/13  Intake/Output from previous day: 03/06 0701 - 03/07 0700 In: 360 [P.O.:360] Out: 1925 [Urine:1925]     Physical Exam: General- pt is awake,alert, oriented to time place and person Resp- No acute REsp distress, decreased bs at bases. CVS- S1S2 regular in rate and rhythm GIT- BS+, soft, NT, ND EXT- NO LE Edema, Cyanosis   Lab Results: CBC  Recent Labs  07/28/13 0538 07/29/13 0551  WBC 8.3 6.9  HGB 10.9* 10.8*  HCT 31.0* 31.1*  PLT 124* 153    BMET  Recent Labs  07/28/13 0538 07/29/13 0551  NA 137 136*  K 4.3 3.6*  CL 102 98  CO2 22 23  GLUCOSE 185* 129*  BUN 56* 54*  CREATININE 2.58* 2.29*  CALCIUM 8.5 8.7    Trend Creat  2015 2.9==>3.2 ==>2.5==>2.2 2014 2.2  2011  1.78  2010 1.6--2.58    MICRO Recent Results (from the past 240 hour(s))  SURGICAL PCR SCREEN     Status: None   Collection Time    07/25/13  7:49 AM      Result Value Ref Range Status   MRSA, PCR NEGATIVE  NEGATIVE Final   Staphylococcus aureus NEGATIVE  NEGATIVE Final   Comment:            The Xpert SA Assay (FDA     approved for NASAL specimens     in patients over 78 years of age),     is one component of     a comprehensive surveillance     program.  Test performance has     been validated by [facility] for patients greater     than or equal to 78 year old.     It is not intended     to diagnose infection nor to     guide or monitor treatment.  WOUND CULTURE     Status: None   Collection Time    07/25/13 10:29 AM      Result Value Ref Range Status   Specimen Description  WOUND LEFT HIP CAPSULE   Final   Special Requests ANCEF 2gm   Final   Gram Stain     Final   Value: NO WBC SEEN     NO SQUAMOUS EPITHELIAL CELLS SEEN     NO ORGANISMS SEEN     Performed at [facility]   Culture     Final   Value: NO GROWTH 2 DAYS     Performed at [facility]   Report Status 07/27/2013 FINAL   Final  CULTURE, BLOOD (ROUTINE X 2)     Status: None   Collection Time    07/25/13  8:16 PM      Result Value Ref Range Status   Specimen Description BLOOD RIGHT HAND   Final   Special Requests BOTTLES DRAWN AEROBIC AND ANAEROBIC 6CC EACH   Final   Culture NO GROWTH 4 DAYS   Final   Report Status PENDING   Incomplete  CULTURE, BLOOD (ROUTINE X 2)     Status: None   Collection Time    07/25/13  8:16 PM      Result Value Ref Range Status   Specimen Description BLOOD RIGHT ANTECUBITAL   Final   Special Requests BOTTLES DRAWN AEROBIC AND ANAEROBIC 4CC EACH   Final   Culture NO GROWTH 4 DAYS   Final   Report Status PENDING   Incomplete  URINE CULTURE     Status: None   Collection Time    07/26/13 12:18 AM      Result Value Ref Range Status   Specimen Description  URINE, CATHETERIZED   Final   Special Requests NONE   Final   Culture  Setup Time     Final   Value: 07/26/2013 01:50     Performed at [facility] Count     Final   Value: NO GROWTH     Performed at [facility]   Culture     Final   Value: NO GROWTH     Performed at [facility]   Report Status 07/27/2013 FINAL   Final      Lab Results  Component Value Date   PTH 144.3* 07/26/2013   CALCIUM 8.7 07/29/2013   PHOS 3.6 07/26/2013             Impression: 1)Renal AKI secondary to Multiple factors  Post renal  Prerenal and ATN from Hypotension  Meds- pt had Ace on board as outpt  AKI on CKD  AKi improving Creat near to baseline  CKD stage 4 .  CKD since 2010  CKD secondary to HTN/ DM/ Morbid obesty/obstruction ( pt has hx of nephrolithiasis)   2)HTN BP a goal for acute state  On Diuretics   3)Anemia HGb at goal (9--11)   4)CKD Mineral-Bone Disorder  PTH High.  Secondary Hyperparathyroidism present Phosphorus at goal Vitamin 25-OH low on replacement    5)Ortho- admitted with Left Hip fracture.  S/p Arthroplasty.  Ortho and Primary MD following   6)Electrolytes  Normokalemic  NOrmonatremic   7)Acid base  Co2 at goal    Plan:  Will continue current care Pt will benefit from nephrology follow up as outpt.     Brettney Ficken S 07/29/2013, 9:56 AM

## 2013-07-29 NOTE — Progress Notes (Signed)
PROGRESS NOTE  [name] VOZ:[id number] DOB: 21-Mar-1934 DOA: 07/23/2013 PCP: Georgiann MohsSHAH,AMIT, MD  Summary: 80 year old man presented to the emergency department after he fell at home with resultant left hip pain. Admitted for left hip fracture. Seen in consultation with cardiology, cleared for surgery. Surgery was uneventful. Developed renal failure which is now improving with nephrology management.  Assessment/Plan: 1. Left hip fracture. Status post surgery without complication. Management per orthopedics.  2. Acute renal failure superimposed on chronic kidney disease stage IV. Thought to be secondary to ATN, hypotension, prerenal, medications. Appears to be very close to baseline. ACE inhibitor on hold. 3. Left-sided hydronephrosis. Followup with urology as an outpatient for cystoscopy, cannot be pursued at this point given hip surgery. 4. Acute blood loss anemia. Secondary to surgery. Status post transfusion per orthopedics. 5. Thrombocytopenia. Resolved. Likely secondary to perioperative blood loss. Developed prior to initiation of heparin products. 6. Diabetes mellitus. Hemoglobin A1c 6.6. Stable. Continue present management. 7. History of coronary artery disease. Continue present management. No beta blocker at this point per cardiology. Not currently on ACE inhibitor secondary to renal failure. Brief episode of chest pain earlier today transient in nature. Per wife he has chronic spells. EKG independently reviewed today shows sinus rhythm with no acute changes.   Management postop hip surgery per orthopedics. Slowly improving with physical therapy.  Continue management of renal failure per nephrology--discussed with Dr. Wolfgang PhoenixBhutani, ok for discharge.  Anticipate discharge 3/8, discussed with wife at bedside  Code Status: full code DVT prophylaxis: Lovenox Family Communication:  Disposition Plan: SNF  Brendia Sacksaniel Goodrich, MD  [facility]  Pager [phone] If 7PM-7AM, please contact  night-coverage at [email or web address], password [email or web address] 07/29/2013, 6:13 PM  LOS: 6 days   Consultants:  Orthopedics  Cardiology  Nephrology  Procedures:  Transfusion 2 units packed red blood cells  2-D echocardiogram: LVEF 55-60%. Grade 1 diastolic dysfunction.  HPI/Subjective: Late entry. Patient had an episode of chest pain earlier today. This was transient in nature. His wife reports "this happens all the time". Currently denies any complaints.  Objective: Filed Vitals:   07/29/13 1033 07/29/13 1200 07/29/13 1509 07/29/13 1515  BP: 140/70   142/73  Pulse: 79   78  Temp: 97.2 F (36.2 C)   97.3 F (36.3 C)  TempSrc: Oral   Oral  Resp: 18 18 18 18   Height:      Weight:      SpO2: 98% 98% 97% 96%    Intake/Output Summary (Last 24 hours) at 07/29/13 1813 Last data filed at 07/29/13 1510  Gross per 24 hour  Intake      0 ml  Output   1475 ml  Net  -1475 ml     Filed Weights   07/23/13 1815 07/23/13 2133  Weight: 109.77 kg (242 lb) 122.199 kg (269 lb 6.4 oz)    Exam:   Afebrile, vital signs stable. No hypoxia.  General. Well-appearing.  Cardiovascular regular rate and rhythm. No murmur, rub or gallop.  Respiratory clear to auscultation bilaterally. No wheezes, rales or rhonchi. Normal respiratory effort.  Psychiatric grossly normal mood and affect. Speech fluent and appropriate.  Data Reviewed:  Excellent urine output. 1925.  Capillary blood sugars stable  Creatinine has improved, to 2.82 >> 2.58 >> 2.29  Potassium 3.6  Hemoglobin stable, 10.8. Platelet count spontaneously recovered. Likely related to perioperative blood loss. Lovenox continues.  Scheduled Meds: . aspirin EC  81 mg Oral Daily  . atorvastatin  20 mg Oral QHS  .  citalopram  20 mg Oral Daily  . enoxaparin (LOVENOX) injection  60 mg Subcutaneous Q24H  . furosemide  40 mg Intravenous Q12H  . gabapentin  100 mg Oral TID  . insulin aspart  0-5 Units Subcutaneous QHS  . insulin aspart  0-9  Units Subcutaneous TID WC  . insulin detemir  25 Units Subcutaneous Daily  . pantoprazole  40 mg Oral BID  . tamsulosin  0.4 mg Oral Daily  . Vitamin D (Ergocalciferol)  50,000 Units Oral Q7 days   Continuous Infusions: . sodium chloride 75 mL/hr at 07/28/13 1719    Principal Problem:   Hip fracture, left Active Problems:   DM   HYPERTENSION   CAD   GERD   CKD (chronic kidney disease) stage 4, GFR 15-29 ml/min   Preoperative cardiovascular examination   Acute on chronic renal failure   Left shoulder pain   Hydronephrosis, left   Anemia   Thrombocytopenia, unspecified   Postoperative anemia due to acute blood loss   Time spent 15 minutes

## 2013-07-29 NOTE — Evaluation (Signed)
Physical Therapy Evaluation Patient Details Name: [name] MRN: [id number] DOB: 11/22/1933 Today's Date: 07/29/2013 Time: 0810-0910 PT Time Calculation (min): 60 min  PT Assessment / Plan / Recommendation History of Present Illness     Clinical Impression  Patient seen lying in bed with abductor wedge, performed bed mobility to sit at edge of bed , able to sit with mod A occasionally as patient tends to lean back and not able to bear weight on the floor with RLE also,tends to lean on right side to decrease the  Weight bearing on left side, patient with P+ to F- static balance in sitting for upto 25 min, attempted to perform STS transfers with raised bed , patient not able to push with BL UE to stand up , patient hard of hearing , educated with hip precautions ,family present during the therapy , educated with hip precautions for the patient and dc plan to SNF which appropriate at this point. Patient left in bed with family and placed the abductor wedge again.  PT Assessment  Patient needs continued PT services    Follow Up Recommendations       Does the patient have the potential to tolerate intense rehabilitation      Barriers to Discharge        Equipment Recommendations       Recommendations for Other Services     Frequency      Precautions / Restrictions Restrictions Weight Bearing Restrictions: Yes LLE Weight Bearing: Touchdown weight bearing   Pertinent Vitals/Pain       Mobility  Bed Mobility Overal bed mobility: Needs Assistance Bed Mobility: Supine to Sit Supine to sit: Mod assist Sit to supine: Mod assist General bed mobility comments: patient needed assistance with LLE placements out of bed Transfers Overall transfer level: Needs assistance Sit to Stand: Max assist    Exercises Total Joint Exercises Ankle Circles/Pumps: AROM;Supine;15 reps Quad Sets: AROM;10 reps;Left;Supine Short Arc Quad: AROM;Left;10 reps;Seated Hip ABduction/ADduction:  Left;10 reps;AAROM;Supine Straight Leg Raises: AAROM;Left;10 reps;Supine Knee Flexion: AROM;Left;10 reps;Seated   PT Diagnosis: Difficulty walking;Generalized weakness  PT Problem List: Decreased strength;Decreased activity tolerance;Decreased balance;Decreased mobility PT Treatment Interventions:       PT Goals(Current goals can be found in the care plan section) Acute Rehab PT Goals Patient Stated Goal: to be able to gom home walking PT Goal Formulation: With patient Time For Goal Achievement: 08/04/13 Potential to Achieve Goals: Good  Visit Information  Last PT Received On: 07/29/13       Prior Functioning       Cognition  Cognition Arousal/Alertness: Awake/alert Behavior During Therapy: [facility]WFL for tasks assessed/performed Overall Cognitive Status: Within Functional Limits for tasks assessed    Extremity/Trunk Assessment Upper Extremity Assessment Upper Extremity Assessment: Defer to OT evaluation Lower Extremity Assessment Lower Extremity Assessment: Defer to PT evaluation   Balance Balance Overall balance assessment: Needs assistance Sitting-balance support: Single extremity supported Sitting balance-Leahy Scale: Poor Sitting balance - Comments: patient not able to sit without UE support Postural control: Posterior lean  End of Session PT - End of Session Equipment Utilized During Treatment: Gait belt Activity Tolerance: Patient tolerated treatment well Patient left: in bed;with call bell/phone within reach;with family/visitor present Nurse Communication: Mobility status  GP     Ok EdwardsPatel, Yesmin Mutch V 07/29/2013, 10:12 AM

## 2013-07-30 LAB — BASIC METABOLIC PANEL
BUN: 57 mg/dL — AB (ref 6–23)
CALCIUM: 9 mg/dL (ref 8.4–10.5)
CO2: 25 meq/L (ref 19–32)
CREATININE: 2.31 mg/dL — AB (ref 0.50–1.35)
Chloride: 98 mEq/L (ref 96–112)
GFR calc Af Amer: 30 mL/min — ABNORMAL LOW (ref >90)
GFR, EST NON AFRICAN AMERICAN: 26 mL/min — AB (ref >90)
GLUCOSE: 179 mg/dL — AB (ref 70–99)
Potassium: 3.5 mEq/L — ABNORMAL LOW (ref 3.7–5.3)
SODIUM: 137 meq/L (ref 137–147)

## 2013-07-30 LAB — GLUCOSE, CAPILLARY
GLUCOSE-CAPILLARY: 193 mg/dL — AB (ref 70–99)
GLUCOSE-CAPILLARY: 129 mg/dL — AB (ref 70–99)
GLUCOSE-CAPILLARY: 126 mg/dL — AB (ref 70–99)
Glucose-Capillary: 168 mg/dL — ABNORMAL HIGH (ref 70–99)

## 2013-07-30 LAB — CULTURE, BLOOD (ROUTINE X 2)
CULTURE: NO GROWTH
Culture: NO GROWTH

## 2013-07-30 MED ORDER — POTASSIUM CHLORIDE CRYS ER 20 MEQ PO TBCR
40.0000 meq | EXTENDED_RELEASE_TABLET | Freq: Once | ORAL | Status: AC
  Administered 2013-07-30: 40 meq via ORAL
  Filled 2013-07-30: qty 2

## 2013-07-30 MED ORDER — ENOXAPARIN SODIUM 60 MG/0.6ML ~~LOC~~ SOLN: 60.0000 mg | SUBCUTANEOUS | Status: DC

## 2013-07-30 MED ORDER — INSULIN DETEMIR 100 UNIT/ML ~~LOC~~ SOLN: 25.0000 [IU] | Freq: Every day | SUBCUTANEOUS | Status: AC

## 2013-07-30 MED ORDER — HYDROCODONE-ACETAMINOPHEN 5-325 MG PO TABS: 1.0000 | ORAL_TABLET | Freq: Four times a day (QID) | ORAL | Status: DC | PRN

## 2013-07-30 MED ORDER — FUROSEMIDE 20 MG PO TABS
20.0000 mg | ORAL_TABLET | Freq: Every day | ORAL | Status: DC
  Administered 2013-07-31: 20 mg via ORAL
  Filled 2013-07-30: qty 1

## 2013-07-30 NOTE — Progress Notes (Signed)
Patient progressing with therapy , able to complete Bed mobility with min A today with HOB elevated , refused for sitting at edge of the bed initially however agreed with family and therapist motivation , able to sit at EOB with out support for up to 5 minutes today with greater stability , planning to dc to SNF today.

## 2013-07-30 NOTE — Progress Notes (Signed)
Subjective: 5 Days Post-Op Procedure(s) (LRB): ARTHROPLASTY BIPOLAR HIP (Left) Patient reports pain as mild.    Objective: Vital signs in last 24 hours: Temp:  [97.2 F (36.2 C)-99.7 F (37.6 C)] 99.7 F (37.6 C) (03/08 0436) Pulse Rate:  [78-83] 81 (03/08 0436) Resp:  [18] 18 (03/08 0436) BP: (121-142)/(58-73) 121/58 mmHg (03/08 0436) SpO2:  [91 %-98 %] 91 % (03/08 0436)  Intake/Output from previous day: 03/07 0701 - 03/08 0700 In: 600 [P.O.:600] Out: 3400 [Urine:3400] Intake/Output this shift:     Recent Labs  07/28/13 0538 07/29/13 0551  HGB 10.9* 10.8*    Recent Labs  07/28/13 0538 07/29/13 0551  WBC 8.3 6.9  RBC 3.43* 3.45*  HCT 31.0* 31.1*  PLT 124* 153    Recent Labs  07/29/13 0551 07/30/13 0641  NA 136* 137  K 3.6* 3.5*  CL 98 98  CO2 23 25  BUN 54* 57*  CREATININE 2.29* 2.31*  GLUCOSE 129* 179*  CALCIUM 8.7 9.0   No results found for this basename: LABPT, INR,  in the last 72 hours  Neurologically intact Neurovascular intact Sensation intact distally Intact pulses distally Dorsiflexion/Plantar flexion intact Incision: no drainage  Assessment/Plan: 5 Days Post-Op Procedure(s) (LRB): ARTHROPLASTY BIPOLAR HIP (Left) Discharge to SNF  I will see in office in one month.  He is to have toe touch weight bearing on the left with walker.  He is to have abduction pillow in place in bed for one month.  He is to have enoxaparin daily for one month.  X-rays to be done at nursing home prior to my appointment in office.  Staples to be removed next Wednesday.  Kaylea Mounsey 07/30/2013, 10:32 AM

## 2013-07-30 NOTE — Progress Notes (Signed)
PROGRESS NOTE  [name] ZOX:[id number] DOB: 1933/08/17 DOA: 07/23/2013 PCP: Georgiann MohsSHAH,AMIT, MD  Summary: 80 year old man presented to the emergency department after he fell at home with resultant left hip pain. Admitted for left hip fracture. Seen in consultation with cardiology, cleared for surgery. Surgery was uneventful. Developed renal failure which is now improving with nephrology management.  Assessment/Plan: 1. Left hip fracture. Status post surgery without complication. Management per orthopedics.  2. Acute renal failure superimposed on chronic kidney disease stage IV. Thought to be secondary to ATN, hypotension, prerenal, medications. Very close to baseline. Continue to hold ACE inhibitor. Suggest outpatient nephrology followup. 3. Left-sided hydronephrosis. Followup with urology as an outpatient for cystoscopy, cannot be pursued at this point given hip surgery. 4. Acute blood loss anemia. Secondary to surgery. Status post transfusion per orthopedics. Stable. 5. Thrombocytopenia. Resolved. Likely secondary to perioperative blood loss. Developed prior to initiation of heparin products. 6. Diabetes mellitus. Hemoglobin A1c 6.6. Stable. Continue Lantus on discharge. Hold Actos for now. 7. History of coronary artery disease. Continue present management. No beta blocker at this point per cardiology. Not currently on ACE inhibitor secondary to renal failure.    Plan transfer to skilled nursing facility today. Followup with Dr. Hilda LiasKeeling in one month. Toe touch weight bearing on the left with walker. He is to have abduction pillow in place in bed for one month. Enoxaparin one month. Repeat x-rays prior to her office appointment. Remove staples per orthopedics.  Monitor CBC and creatinine while on Lovenox, BMP in one week follow-up potassium and creatinine.  Replace potassium  Recommend outpatient nephrology followup  Outpatient urology followup  Code Status: full code DVT prophylaxis:  Lovenox Family Communication:  Disposition Plan: SNF  Brendia Sacksaniel Goodrich, MD  [facility]  Pager [phone] If 7PM-7AM, please contact night-coverage at [email or web address], password [email or web address] 07/30/2013, 12:56 PM  LOS: 7 days   Consultants:  Orthopedics  Cardiology  Nephrology  Procedures:  Transfusion 2 units packed red blood cells  2-D echocardiogram: LVEF 55-60%. Grade 1 diastolic dysfunction.  HPI/Subjective: Overall doing well. Did better with physical therapy today. No chest pain or shortness of breath.  Objective: Filed Vitals:   07/29/13 2115 07/30/13 0000 07/30/13 0400 07/30/13 0436  BP: 122/66   121/58  Pulse: 83   81  Temp: 99.1 F (37.3 C)   99.7 F (37.6 C)  TempSrc: Oral   Oral  Resp: 18 18 18 18   Height:      Weight:      SpO2: 92% 92% 91% 91%    Intake/Output Summary (Last 24 hours) at 07/30/13 1256 Last data filed at 07/30/13 0344  Gross per 24 hour  Intake    240 ml  Output   2900 ml  Net  -2660 ml     Filed Weights   07/23/13 1815 07/23/13 2133  Weight: 109.77 kg (242 lb) 122.199 kg (269 lb 6.4 oz)    Exam:   Afebrile, vital signs stable. No hypoxia.  General. Appears calm and comfortable.  Cardiovascular regular rate and rhythm. No murmur, rub or gallop.   Respiratory clear to auscultation bilaterally. No wheezes, rales or rhonchi. Normal respiratory effort.  Telemetry. Sinus rhythm, few PVCs. Brief episode of narrow complex tachycardia, SVT vs. ST. asymptomatic.  Psychiatric grossly normal mood and affect. Speech fluent and appropriate.  Data Reviewed:  Excellent urine output. 3400  Capillary blood sugars stable  Creatinine very close to baseline. No significant change.  Potassium 3.5  Scheduled Meds: . aspirin EC  81 mg Oral Daily  . atorvastatin  20 mg Oral QHS  . citalopram  20 mg Oral Daily  . enoxaparin (LOVENOX) injection  60 mg Subcutaneous Q24H  . furosemide  40 mg Intravenous Q12H  . gabapentin  100 mg Oral TID   . insulin aspart  0-5 Units Subcutaneous QHS  . insulin aspart  0-9 Units Subcutaneous TID WC  . insulin detemir  25 Units Subcutaneous Daily  . pantoprazole  40 mg Oral BID  . tamsulosin  0.4 mg Oral Daily  . Vitamin D (Ergocalciferol)  50,000 Units Oral Q7 days   Continuous Infusions: . sodium chloride 75 mL/hr at 07/28/13 1719    Principal Problem:   Hip fracture, left Active Problems:   DM   HYPERTENSION   CAD   GERD   CKD (chronic kidney disease) stage 4, GFR 15-29 ml/min   Preoperative cardiovascular examination   Acute on chronic renal failure   Left shoulder pain   Hydronephrosis, left   Anemia   Thrombocytopenia, unspecified   Postoperative anemia due to acute blood loss

## 2013-07-30 NOTE — Discharge Summary (Signed)
Physician Discharge Summary  [name]:[id number] DOB: 1933/11/16 DOA: 07/23/2013  PCP: Georgiann Mohs, MD  Admit date: 07/23/2013 Discharge date: 07/30/2013  Recommendations for Outpatient Follow-up:  1. Followup hip fracture. Followup with Dr. Hilda Lias in one month. Toe touch weight bearing on the left with walker. He is to have abduction pillow in place in bed for one month. Enoxaparin one month today, last day April 4. Repeat x-rays prior to office appointment. Remove staples per orthopedics.  2. Monitor CBC and creatinine while on Lovenox as per pharmacy recommendations, BMP in one week follow-up potassium and creatinine. 3. Acute on chronic renal failure. Appears to be at baseline this point. Palpation nephrology followup recommended. 4. Left-sided hydronephrosis, cystoscopy could not be performed until hip had healed, outpatient followup with urology.   Follow-up Information   Follow up with [facility], MD In 2 weeks.   Specialty:  Internal Medicine      Follow up with Darreld Mclean, MD. Schedule an appointment as soon as possible for a visit in 1 month.   Specialty:  Orthopedic Surgery   Contact information:   [address] [place] [address] [phone]       Follow up with [facility], MD. Schedule an appointment as soon as possible for a visit in 3 weeks.   Specialty:  Nephrology   Contact information:   [address] [place] [address] [phone]       Follow up with Alleen Borne I, MD. Schedule an appointment as soon as possible for a visit in 5 weeks.   Specialty:  Urology   Contact information:   [address] [place] [address] [phone]      Discharge Diagnoses:  1. Left hip fracture status post mechanical fall 2. Acute renal failure superimposed on chronic kidney disease stage IV 3. Left-sided hydronephrosis 4. Acute blood loss anemia, perioperative 5. Thrombocytopenia 6. Diabetes mellitus type  2  Discharge Condition: Improved Disposition: SNF  Diet recommendation: Heart healthy diabetic diet  Filed Weights   07/23/13 1815 07/23/13 2133  Weight: 109.77 kg (242 lb) 122.199 kg (269 lb 6.4 oz)    History of present illness:  80 year old man presented to the emergency department after he fell at home with resultant left hip pain. Admitted for left hip fracture.   Hospital Course:  Patient was seen in consultation with cardiology, cleared for surgery. Surgery was uneventful. He developed acute on chronic renal failure which prolonged his hospitalization. He was seen in consultation with nephrology. He improved with Lasix infusion and is now very close to baseline. He was also found to have left-sided hydronephrosis which will be followed up in the outpatient setting. Individual issues as below.  1. Left hip fracture. Status post surgery without complication.  2. Acute renal failure superimposed on chronic kidney disease stage IV. Thought to be secondary to ATN, hypotension, prerenal, medications. Very close to baseline. Continue to hold ACE inhibitor. Suggest outpatient nephrology followup. 3. Left-sided hydronephrosis. Followup with urology as an outpatient for cystoscopy, cannot be pursued at this point given hip surgery. 4. Acute blood loss anemia. Secondary to surgery. Status post transfusion per orthopedics. Stable. 5. Thrombocytopenia. Resolved. Likely secondary to perioperative blood loss. Developed prior to initiation of heparin products. 6. Diabetes mellitus. Hemoglobin A1c 6.6. Stable. Continue Lantus on discharge. Hold Actos for now. 7. History of coronary artery disease. Continue present management. No beta blocker at this point per cardiology. Not currently on ACE inhibitor secondary to renal failure.  Followup with Dr.  Keeling in one month. Toe touch weight bearing on the left with walker. He is to have abduction pillow in place in bed for one month. Enoxaparin one month.  Repeat x-rays prior to office appointment. Remove staples per orthopedics.  Monitor CBC and creatinine while on Lovenox, BMP in one week follow-up potassium and creatinine.  Consultants:  Orthopedics  Cardiology  Nephrology Procedures:  Transfusion 2 units packed red blood cells  2-D echocardiogram: LVEF 55-60%. Grade 1 diastolic dysfunction. Bipolar hip replacement on the left  Discharge Instructions     Medication List    STOP taking these medications       lisinopril 10 MG tablet  Commonly known as:  PRINIVIL,ZESTRIL     pioglitazone 45 MG tablet  Commonly known as:  ACTOS      TAKE these medications       aspirin 81 MG tablet  Take 81 mg by mouth daily.     atorvastatin 20 MG tablet  Commonly known as:  LIPITOR  Take 20 mg by mouth at bedtime.     citalopram 20 MG tablet  Commonly known as:  CELEXA  Take 20 mg by mouth daily.     enoxaparin 60 MG/0.6ML injection  Commonly known as:  LOVENOX  Inject 0.6 mLs (60 mg total) into the skin daily.     furosemide 20 MG tablet  Commonly known as:  LASIX  Take 20 mg by mouth daily.     gabapentin 100 MG capsule  Commonly known as:  NEURONTIN  Take 100 mg by mouth 3 (three) times daily.     HYDROcodone-acetaminophen 5-325 MG per tablet  Commonly known as:  NORCO/VICODIN  Take 1-2 tablets by mouth every 6 (six) hours as needed for moderate pain.     insulin detemir 100 UNIT/ML injection  Commonly known as:  LEVEMIR  Inject 0.25 mLs (25 Units total) into the skin daily.     omeprazole 20 MG capsule  Commonly known as:  PRILOSEC  Take 1 capsule (20 mg total) by mouth 2 (two) times daily.     potassium chloride 10 MEQ CR capsule  Commonly known as:  MICRO-K  Take 10 mEq by mouth daily.     tamsulosin 0.4 MG Caps capsule  Commonly known as:  FLOMAX  Take 0.4 mg by mouth daily.       No Known Allergies The results of significant diagnostics from this hospitalization (including imaging, microbiology,  ancillary and laboratory) are listed below for reference.    Significant Diagnostic Studies: Dg Chest 1 View  07/23/2013   CLINICAL DATA:  Fall today.  Left hip fracture.  EXAM: CHEST - 1 VIEW  COMPARISON:  05/2013  FINDINGS: Cardiac silhouette is normal in size. The aorta is uncoiled. Changes from CABG surgery are stable. No mediastinal or hilar masses.  Clear lungs.  No pleural effusion or pneumothorax.  Bony thorax is demineralized.  IMPRESSION: No acute cardiopulmonary disease.   Electronically Signed   By: Amie Portland M.D.   On: 07/23/2013 20:00   Dg Hip Complete Left  07/23/2013   CLINICAL DATA:  Larey Seat today.  Left hip pain.  EXAM: LEFT HIP - COMPLETE 2+ VIEW  COMPARISON:  None.  FINDINGS: There is a fracture of the left femoral neck. The fracture is mid cervical, non comminuted with mild displacement. The distal fracture fragment has retracted superiorly in relation to the neck fracture fragment approximately 15 mm. There is no significant varus or valgus angulation.  No  other fractures. The hip joints, SI joints and symphysis pubis are normally aligned. The bones are diffusely demineralized.  IMPRESSION: Left femoral neck fracture as described.   Electronically Signed   By: Amie Portlandavid  Ormond M.D.   On: 07/23/2013 19:59   [place] Renal  07/25/2013   CLINICAL DATA:  Chronic renal failure with superimposed acute renal failure.  EXAM: RENAL/URINARY TRACT ULTRASOUND COMPLETE  COMPARISON:  None.  FINDINGS: Right Kidney:  Length: 12.5 cm. Echogenicity within normal limits where visualized. There is shadowing from the adjacent gallbladder area and. No mass or hydronephrosis visualized.  Left Kidney:  Length: 12.1 cm. There is mild left-sided hydronephrosis of uncertain etiology. No parenchymal mass in the left kidney is demonstrated.  Bladder:  The urinary bladder is decompressed with a Foley catheter.  IMPRESSION: 1. There is mild left-sided hydronephrosis. 2. The right kidney appears normal where visualized. There  is shadowing from the adjacent gallbladder. A CT scan of the abdomen performed in September 2014 revealed layering radiodense bile.   Electronically Signed   By: David  SwazilandJordan   On: 07/25/2013 15:36   Dg Chest Port 1 View  07/25/2013   CLINICAL DATA:  Fever.  EXAM: PORTABLE CHEST - 1 VIEW  COMPARISON:  07/23/2013.  FINDINGS: The heart is enlarged but stable. There is stable tortuosity, ectasia and calcification of the thoracic aorta. Stable surgical changes from bypass surgery. Low lung volumes with mild vascular crowding and streaky subsegmental bibasilar atelectasis. No definite infiltrates or effusions. The bony thorax is intact.  IMPRESSION: Low lung volumes with subsegmental basilar atelectasis.   Electronically Signed   By: Loralie ChampagneMark  Gallerani M.D.   On: 07/25/2013 20:43   Dg Shoulder Left  07/23/2013   CLINICAL DATA:  Fall.  Left shoulder pain.  EXAM: LEFT SHOULDER - 2+ VIEW  COMPARISON:  None.  FINDINGS: No fracture. No dislocation. There are mild to moderate AC joint osteoarthritic changes. The glenohumeral joint is well preserved. The underlying ribs are intact. The bones are demineralized.  IMPRESSION: No fracture or dislocation.   Electronically Signed   By: Amie Portlandavid  Ormond M.D.   On: 07/23/2013 19:59   Dg Hip Portable 1 View Left  07/25/2013   CLINICAL DATA:  Portable view status post left hip arthroplasty  EXAM: PORTABLE LEFT HIP - 1 VIEW  COMPARISON:  DG HIP COMPLETE*L* dated 07/23/2013  FINDINGS: The initial film reveals the presence of the femoral component of the prosthesis. Subsequently the shaft of the femoral head component is demonstrated. Subsequent to that of the acetabular cup is visible. There is no immediate postprocedure complication demonstrated.  IMPRESSION: The patient has undergone left total hip joint prosthesis placement without evidence of immediate postprocedure complication.   Electronically Signed   By: David  [place]il[place]Jordan   On: 07/25/2013 11:43    Microbiology: Recent Results (from  the past 240 hour(s))  SURGICAL PCR SCREEN     Status: None   Collection Time    07/25/13  7:49 AM      Result Value Ref Range Status   MRSA, PCR NEGATIVE  NEGATIVE Final   Staphylococcus aureus NEGATIVE  NEGATIVE Final   Comment:            The Xpert SA Assay (FDA     approved for NASAL specimens     in patients over 78 years of age),     is one component of     a comprehensive surveillance     program.  Test performance has  been validated by [facility] for patients greater     than or equal to 60 year old.     It is not intended     to diagnose infection nor to     guide or monitor treatment.  WOUND CULTURE     Status: None   Collection Time    07/25/13 10:29 AM      Result Value Ref Range Status   Specimen Description WOUND LEFT HIP CAPSULE   Final   Special Requests ANCEF 2gm   Final   Gram Stain     Final   Value: NO WBC SEEN     NO SQUAMOUS EPITHELIAL CELLS SEEN     NO ORGANISMS SEEN     Performed at [facility]   Culture     Final   Value: NO GROWTH 2 DAYS     Performed at [facility]   Report Status 07/27/2013 FINAL   Final  CULTURE, BLOOD (ROUTINE X 2)     Status: None   Collection Time    07/25/13  8:16 PM      Result Value Ref Range Status   Specimen Description BLOOD RIGHT HAND   Final   Special Requests BOTTLES DRAWN AEROBIC AND ANAEROBIC 6CC EACH   Final   Culture NO GROWTH 5 DAYS   Final   Report Status 07/30/2013 FINAL   Final  CULTURE, BLOOD (ROUTINE X 2)     Status: None   Collection Time    07/25/13  8:16 PM      Result Value Ref Range Status   Specimen Description BLOOD RIGHT ANTECUBITAL   Final   Special Requests BOTTLES DRAWN AEROBIC AND ANAEROBIC 4CC EACH   Final   Culture NO GROWTH 5 DAYS   Final   Report Status 07/30/2013 FINAL   Final  URINE CULTURE     Status: None   Collection Time    07/26/13 12:18 AM      Result Value Ref Range Status   Specimen Description URINE, CATHETERIZED   Final   Special Requests  NONE   Final   Culture  Setup Time     Final   Value: 07/26/2013 01:50     Performed at [facility] Count     Final   Value: NO GROWTH     Performed at [facility]   Culture     Final   Value: NO GROWTH     Performed at [facility]   Report Status 07/27/2013 FINAL   Final     Labs: Basic Metabolic Panel:  Recent Labs Lab 07/26/13 0522 07/26/13 1636 07/27/13 0440 07/27/13 0500 07/28/13 0538 07/29/13 0551 07/30/13 0641  NA 137  --  135*  --  137 136* 137  K 5.0  --  4.5  --  4.3 3.6* 3.5*  CL 105  --  103  --  102 98 98  CO2 20  --  22  --  22 23 25   GLUCOSE 214*  --  173*  --  185* 129* 179*  BUN 51*  --  51*  --  56* 54* 57*  CREATININE 3.29*  --  2.82*  --  2.58* 2.29* 2.31*  CALCIUM 8.2* 7.8* 8.2*  --  8.5 8.7 9.0  MG  --   --   --  1.4*  --  1.7  --   PHOS  --  3.6  --   --   --   --   --    Liver Function Tests:  Recent Labs Lab 07/23/13 1851  AST 19  ALT 13  ALKPHOS 104  BILITOT 0.4  PROT 6.9  ALBUMIN 3.3*   CBC:  Recent Labs Lab 07/25/13 0526  07/25/13 1826 07/26/13 0522 07/27/13 0440 07/28/13 0538 07/29/13 0551  WBC 8.6  --  10.3 10.7* 8.4 8.3 6.9  NEUTROABS 5.7  --  8.5* 7.4 5.8 6.0  --   HGB 9.8*  < > 10.2* 9.2* 8.6* 10.9* 10.8*  HCT 28.6*  < > 30.0* 27.2* 24.8* 31.0* 31.1*  MCV 94.1  --  92.6 93.2 91.2 90.4 90.1  PLT 120*  --  123* 99* 100* 124* 153  < > = values in this interval not displayed.  CBG:  Recent Labs Lab 07/29/13 1208 07/29/13 1653 07/29/13 2120 07/30/13 0745 07/30/13 1132  GLUCAP 159* 139* 186* 168* 193*    Principal Problem:   Hip fracture, left Active Problems:   DM   HYPERTENSION   CAD   GERD   CKD (chronic kidney disease) stage 4, GFR 15-29 ml/min   Preoperative cardiovascular examination   Acute on chronic renal failure   Left shoulder pain   Hydronephrosis, left   Anemia   Thrombocytopenia, unspecified   Postoperative anemia due to acute blood loss   Time  coordinating discharge: 40 minutes  Signed:  Brendia Sacks, MD [facility] 07/30/2013, 1:09 PM

## 2013-07-30 NOTE — Progress Notes (Signed)
[name]  MRN: [id number]  DOB/AGE: 1934-02-01 79 y.o.  Primary Care Physician:SHAH,AMIT, MD  Admit date: 07/23/2013  Chief Complaint:  Chief Complaint  Patient presents with  . Fall    S-Pt presented on  07/23/2013 with  Chief Complaint  Patient presents with  . Fall  .    Pt today feels better. Pt offers no new complaints.    Pt and family is awaiting to be d/ced.   Meds . aspirin EC  81 mg Oral Daily  . atorvastatin  20 mg Oral QHS  . citalopram  20 mg Oral Daily  . enoxaparin (LOVENOX) injection  60 mg Subcutaneous Q24H  . furosemide  40 mg Intravenous Q12H  . gabapentin  100 mg Oral TID  . insulin aspart  0-5 Units Subcutaneous QHS  . insulin aspart  0-9 Units Subcutaneous TID WC  . insulin detemir  25 Units Subcutaneous Daily  . pantoprazole  40 mg Oral BID  . tamsulosin  0.4 mg Oral Daily  . Vitamin D (Ergocalciferol)  50,000 Units Oral Q7 days      Physical Exam: Vital signs in last 24 hours: Temp:  [97.2 F (36.2 C)-99.7 F (37.6 C)] 99.7 F (37.6 C) (03/08 0436) Pulse Rate:  [78-83] 81 (03/08 0436) Resp:  [18] 18 (03/08 0436) BP: (121-142)/(58-73) 121/58 mmHg (03/08 0436) SpO2:  [91 %-98 %] 91 % (03/08 0436) Weight change:  Last BM Date: 07/28/13  Intake/Output from previous day: 03/07 0701 - 03/08 0700 In: 600 [P.O.:600] Out: 3400 [Urine:3400]     Physical Exam: General- pt is awake,alert, oriented to time place and person. Resp- No acute REsp distress, decreased bs at bases. CVS- S1S2 regular in rate and rhythm GIT- BS+, soft, NT, ND EXT- NO LE Edema, Cyanosis   Lab Results: CBC  Recent Labs  07/28/13 0538 07/29/13 0551  WBC 8.3 6.9  HGB 10.9* 10.8*  HCT 31.0* 31.1*  PLT 124* 153    BMET  Recent Labs  07/29/13 0551 07/30/13 0641  NA 136* 137  K 3.6* 3.5*  CL 98 98  CO2 23 25  GLUCOSE 129* 179*  BUN 54* 57*  CREATININE 2.29* 2.31*  CALCIUM 8.7 9.0    Trend Creat  2015 2.9==>3.2 ==>2.5==>2.2--2.3 2014  2.2  2011 1.78  2010 1.6--2.58    MICRO Recent Results (from the past 240 hour(s))  SURGICAL PCR SCREEN     Status: None   Collection Time    07/25/13  7:49 AM      Result Value Ref Range Status   MRSA, PCR NEGATIVE  NEGATIVE Final   Staphylococcus aureus NEGATIVE  NEGATIVE Final   Comment:            The Xpert SA Assay (FDA     approved for NASAL specimens     in patients over 71 years of age),     is one component of     a comprehensive surveillance     program.  Test performance has     been validated by [facility] for patients greater     than or equal to 68 year old.     It is not intended     to diagnose infection nor to     guide or monitor treatment.  WOUND CULTURE     Status: None   Collection Time    07/25/13 10:29 AM      Result Value Ref Range Status  Specimen Description WOUND LEFT HIP CAPSULE   Final   Special Requests ANCEF 2gm   Final   Gram Stain     Final   Value: NO WBC SEEN     NO SQUAMOUS EPITHELIAL CELLS SEEN     NO ORGANISMS SEEN     [facility]   Culture     Final   Value: NO GROWTH 2 DAYS     [facility] at [facility]   Report Status 07/27/2013 FINAL   Final  CULTURE, BLOOD (ROUTINE X 2)     Status: None   Collection Time    07/25/13  8:16 PM      Result Value Ref Range Status   Specimen Description BLOOD RIGHT HAND   Final   Special Requests BOTTLES DRAWN AEROBIC AND ANAEROBIC 6CC EACH   Final   Culture NO GROWTH 5 DAYS   Final   Report Status 07/30/2013 FINAL   Final  CULTURE, BLOOD (ROUTINE X 2)     Status: None   Collection Time    07/25/13  8:16 PM      Result Value Ref Range Status   Specimen Description BLOOD RIGHT ANTECUBITAL   Final   Special Requests BOTTLES DRAWN AEROBIC AND ANAEROBIC 4CC EACH   Final   Culture NO GROWTH 5 DAYS   Final   Report Status 07/30/2013 FINAL   Final  URINE CULTURE     Status: None   Collection Time    07/26/13 12:18 AM      Result Value Ref Range Status    Specimen Description URINE, CATHETERIZED   Final   Special Requests NONE   Final   Culture  Setup Time     Final   Value: 07/26/2013 01:50     [facility]formed [facility]   Colony Count     Final   Value: NO GROWTH     [facility]formed at [facility]   Culture     Final   Value: NO GROWTH     [facility]formed at [facility]   Report Status 07/27/2013 FINAL   Final      Lab Results  Component Value Date   PTH 144.3* 07/26/2013   CALCIUM 9.0 07/30/2013   PHOS 3.6 07/26/2013     Impression: 1)Renal AKI secondary to Multiple factors  Post renal  Prerenal and ATN from Hypotension  Meds- pt had Ace on board as outpt  AKI on CKD  AKi improving Creat near to baseline  CKD stage 4 .  CKD since 2010  CKD secondary to HTN/ DM/ Morbid obesty/obstruction ( pt has hx of nephrolithiasis)   2)HTN BP a goal for acute state  On Diuretics   3)Anemia HGb at goal (9--11)   4)CKD Mineral-Bone Disorder  PTH High.  Secondary Hyperparathyroidism present Phosphorus at goal Vitamin 25-OH low on replacement    5)Ortho- admitted with Left Hip fracture.  S/p Arthroplasty.  Ortho and Primary MD following   6)Electrolytes  Normokalemic  NOrmonatremic   7)Acid base  Co2 at goal    Plan:  Will continue current care      [name] S 07/30/2013, 10:17 AM

## 2013-07-31 LAB — GLUCOSE, CAPILLARY
GLUCOSE-CAPILLARY: 155 mg/dL — AB (ref 70–99)
GLUCOSE-CAPILLARY: 174 mg/dL — AB (ref 70–99)

## 2013-07-31 NOTE — Progress Notes (Signed)
PROGRESS NOTE  [name]:[id number] DOB: 19-Sep-1933 DOA: 07/23/2013 PCP: Georgiann Mohs, MD  Summary: 80 year old man presented to the emergency department after he fell at home with resultant left hip pain. Admitted for left hip fracture. Seen in consultation with cardiology, cleared for surgery. Surgery was uneventful. Developed renal failure which is now improving with nephrology management.  Assessment/Plan: 1. Left hip fracture. Status post surgery without complication. Management per orthopedics.  2. Acute renal failure superimposed on chronic kidney disease stage IV. Thought to be secondary to ATN, hypotension, prerenal, medications. Stable. 3. Left-sided hydronephrosis. Followup with urology as an outpatient for cystoscopy, cannot be pursued at this point given hip surgery. 4. Acute blood loss anemia. Secondary to surgery. Status post transfusion per orthopedics. Stable. 5. Thrombocytopenia. Resolved. Likely secondary to perioperative blood loss. Developed prior to initiation of heparin products. 6. Diabetes mellitus. Hemoglobin A1c 6.6. Stable. 7. History of coronary artery disease. Continue present management. No beta blocker at this point per cardiology. Not currently on ACE inhibitor secondary to renal failure.    Remains stable for transfer to skilled nursing facility. Facility could not take yesterday Sunday 3/8.  Brendia Sacks, MD  [facility]  Pager [phone] If 7PM-7AM, please contact night-coverage at [email or web address], password [facility] 07/31/2013, 10:54 AM  LOS: 8 days   Consultants:  Orthopedics  Cardiology  Nephrology  Procedures:  Transfusion 2 units packed red blood cells  2-D echocardiogram: LVEF 55-60%. Grade 1 diastolic dysfunction.  HPI/Subjective: No issues overnight. No complaints. Ready for rehab.  Objective: Filed Vitals:   07/30/13 2000 07/30/13 2041 07/31/13 0000 07/31/13 0400  BP:  120/65    Pulse:  73    Temp:  98.6 F (37 C)      TempSrc:  Oral    Resp: 18 18 18 18   Height:      Weight:      SpO2:  95% 95% 95%    Intake/Output Summary (Last 24 hours) at 07/31/13 1054 Last data filed at 07/31/13 0900  Gross per 24 hour  Intake    720 ml  Output   2100 ml  Net  -1380 ml     Filed Weights   07/23/13 1815 07/23/13 2133  Weight: 109.77 kg (242 lb) 122.199 kg (269 lb 6.4 oz)    Exam:   Afebrile, vital signs stable. No hypoxia.  General. Appears calm and comfortable.  Cardiovascular regular rate and rhythm. No murmur, rub or gallop.  Telemetry sinus rhythm  Respiratory clear to auscultation bilaterally. No wheezes, rales or rhonchi. Normal respiratory effort.  Psychiatric grossly normal mood and affect. Speech fluent and appropriate.  Data Reviewed:  Excellent urine output, -2200  Capillary blood sugars stable  Scheduled Meds: . aspirin EC  81 mg Oral Daily  . atorvastatin  20 mg Oral QHS  . citalopram  20 mg Oral Daily  . enoxaparin (LOVENOX) injection  60 mg Subcutaneous Q24H  . gabapentin  100 mg Oral TID  . insulin aspart  0-5 Units Subcutaneous QHS  . insulin aspart  0-9 Units Subcutaneous TID WC  . insulin detemir  25 Units Subcutaneous Daily  . pantoprazole  40 mg Oral BID  . tamsulosin  0.4 mg Oral Daily  . Vitamin D (Ergocalciferol)  50,000 Units Oral Q7 days   Continuous Infusions:    Principal Problem:   Hip fracture, left Active Problems:   DM   HYPERTENSION   CAD   GERD   CKD (chronic kidney disease) stage 4, GFR 15-29  ml/min   Preoperative cardiovascular examination   Acute on chronic renal failure   Left shoulder pain   Hydronephrosis, left   Anemia   Thrombocytopenia, unspecified   Postoperative anemia due to acute blood loss    Time 10 minutes.

## 2013-07-31 NOTE — Clinical Social Work Note (Signed)
[facility]. Pt, family, and facility aware and agreeable. D/C summary faxed. [facility].  Derenda FennelKara Shanira Tine, [place]CS[place] [phone]

## 2013-07-31 NOTE — Progress Notes (Signed)
Report called to [place] at [facility].  Pt is stable at this time, pt and his wife aware of d/c plan. Awaiting EMS for transport.

## 2013-07-31 NOTE — Progress Notes (Signed)
Subjective: Interval History: has no complaint of difficulty in breathing. Patient denies any nausea vomiting. He complains of left hip pain  Objective: Vital signs in last 24 hours: Temp:  [98.6 F (37 C)-98.9 F (37.2 C)] 98.6 F (37 C) (03/08 2041) Pulse Rate:  [73-88] 73 (03/08 2041) Resp:  [18] 18 (03/09 0400) BP: (120)/(61-65) 120/65 mmHg (03/08 2041) SpO2:  [94 %-95 %] 95 % (03/09 0400) Weight change:   Intake/Output from previous day: 03/08 0701 - 03/09 0700 In: 840 [P.O.:840] Out: 2200 [Urine:2200] Intake/Output this shift:    General appearance: alert, cooperative and no distress Resp: diminished breath sounds posterior - bilateral Cardio: regular rate and rhythm, S1, S2 normal, no murmur, click, rub or gallop GI: soft, non-tender; bowel sounds normal; no masses,  no organomegaly Extremities: edema Trace edema bilaterally  Lab Results:  Recent Labs  07/29/13 0551  WBC 6.9  HGB 10.8*  HCT 31.1*  PLT 153   BMET:   Recent Labs  07/29/13 0551 07/30/13 0641  NA 136* 137  K 3.6* 3.5*  CL 98 98  CO2 23 25  GLUCOSE 129* 179*  BUN 54* 57*  CREATININE 2.29* 2.31*  CALCIUM 8.7 9.0   No results found for this basename: PTH,  in the last 72 hours Iron Studies:   Recent Labs  07/29/13 0551  IRON 19*  TIBC 171*  FERRITIN 740*    Studies/Results: No results found.  I have reviewed the patient's current medications.  Assessment/Plan: Problem #1 acute kidney injury his BUN and creatinine presently has stablised and reached his base line. Patient presently none oliguric. He doesn't have nausea or vomiting. Problem #2 history of chronic kidney disease: Possibly early stage IV or late stage III. Etiology was thought to be secondary to diabetes/hypertension/focal segmental nephrosclerosis. Problem #3 history of hypertension his blood pressure is reasonably controlled Problem #4 diabetes Problem #5 history of fracture Problem #6 anemia: His hemoglobin  hematocrit is low. Patient iron saturation is low but ferritin is very high.. Problem #7 metabolic bone disease: Calcium and phosphorus is range. Patient with vitamin D deficiency. He is on vitamin D supplement. We'll DC Lasix. We'll check her basic metabolic panel in the morning.    LOS: 8 days   Alannis Hsia S 07/31/2013,8:11 AM

## 2013-07-31 NOTE — Progress Notes (Signed)
Subjective: 6 Days Post-Op Procedure(s) (LRB): ARTHROPLASTY BIPOLAR HIP (Left) Patient reports pain as mild.    Objective: Vital signs in last 24 hours: Temp:  [98.6 F (37 C)-98.9 F (37.2 C)] 98.6 F (37 C) (03/08 2041) Pulse Rate:  [73-88] 73 (03/08 2041) Resp:  [18] 18 (03/09 0400) BP: (120)/(61-65) 120/65 mmHg (03/08 2041) SpO2:  [94 %-95 %] 95 % (03/09 0400)  Intake/Output from previous day: 03/08 0701 - 03/09 0700 In: 840 [P.O.:840] Out: 2200 [Urine:2200] Intake/Output this shift:     Recent Labs  07/29/13 0551  HGB 10.8*    Recent Labs  07/29/13 0551  WBC 6.9  RBC 3.45*  HCT 31.1*  PLT 153    Recent Labs  07/29/13 0551 07/30/13 0641  NA 136* 137  K 3.6* 3.5*  CL 98 98  CO2 23 25  BUN 54* 57*  CREATININE 2.29* 2.31*  GLUCOSE 129* 179*  CALCIUM 8.7 9.0   No results found for this basename: LABPT, INR,  in the last 72 hours  Neurologically intact Neurovascular intact Sensation intact distally Intact pulses distally Dorsiflexion/Plantar flexion intact Incision: no drainage  Assessment/Plan: 6 Days Post-Op Procedure(s) (LRB): ARTHROPLASTY BIPOLAR HIP (Left) Discharge to SNF  [facility]ELING,Sebert Stollings 07/31/2013, 7:44 AM

## 2013-07-31 NOTE — Clinical Social Work Placement (Signed)
Clinical Social Work Department CLINICAL SOCIAL WORK PLACEMENT NOTE 07/31/2013  Patient:  [name],[name]  Account Number:  [id number] Admit date:  07/23/2013  Clinical Social Worker:  Derenda FennelKARA Daleah Coulson, LCSW  Date/time:  07/26/2013 10:32 AM  Clinical Social Work is seeking post-discharge placement for this patient at the following level of care:   SKILLED NURSING   (*CSW will update this form in Epic as items are completed)   07/26/2013  Patient/family provided with [facility] of Clinical Social Work's list of facilities offering this level of care within the geographic area requested by the patient (or if unable, by the patient's family).  07/26/2013  Patient/family informed of their freedom to choose among providers that offer the needed level of care, that participate in Medicare, Medicaid or managed care program needed by the patient, have an available bed and are willing to accept the patient.  07/26/2013  Patient/family informed of [facility]' ownership interest in [facility], as well as of the fact that they are under no obligation to receive care at this facility.  PASARR submitted to EDS on 07/26/2013 PASARR number received from EDS on 07/26/2013  FL2 transmitted to all facilities in geographic area requested by pt/family on  07/26/2013 FL2 transmitted to all facilities within larger geographic area on   Patient informed that his/her managed care company has contracts with or will negotiate with  certain facilities, including the following:     Patient/family informed of bed offers received:  07/27/2013 [facility] Physician recommends and patient [facility]  [facility] on  07/31/2013 [facility] be [facility]  The following physician request were entered in Epic:   Additional Comments:  Derenda FennelKara  Ludie Pavlik, LCSW [phone]

## 2013-11-14 ENCOUNTER — Other Ambulatory Visit (HOSPITAL_COMMUNITY): Payer: Self-pay | Admitting: Urology

## 2013-11-14 DIAGNOSIS — N400 Enlarged prostate without lower urinary tract symptoms: Secondary | ICD-10-CM

## 2013-11-14 DIAGNOSIS — N1330 Unspecified hydronephrosis: Secondary | ICD-10-CM

## 2013-11-15 ENCOUNTER — Encounter (HOSPITAL_COMMUNITY): Payer: Self-pay

## 2013-11-15 ENCOUNTER — Ambulatory Visit (HOSPITAL_COMMUNITY): Admission: RE | Admit: 2013-11-15 | Payer: Medicare Other | Source: Ambulatory Visit

## 2013-11-15 ENCOUNTER — Ambulatory Visit (HOSPITAL_COMMUNITY)
Admission: RE | Admit: 2013-11-15 | Discharge: 2013-11-15 | Disposition: A | Discharge status: Home / Self Care | Payer: PRIVATE HEALTH INSURANCE | Source: Ambulatory Visit | Attending: Urology | Admitting: Urology

## 2013-11-15 DIAGNOSIS — N400 Enlarged prostate without lower urinary tract symptoms: Secondary | ICD-10-CM | POA: Insufficient documentation

## 2013-11-15 DIAGNOSIS — N1330 Unspecified hydronephrosis: Secondary | ICD-10-CM | POA: Insufficient documentation

## 2013-11-15 DIAGNOSIS — M549 Dorsalgia, unspecified: Secondary | ICD-10-CM | POA: Insufficient documentation

## 2013-11-15 DIAGNOSIS — R109 Unspecified abdominal pain: Secondary | ICD-10-CM | POA: Diagnosis not present

## 2014-01-31 ENCOUNTER — Other Ambulatory Visit: Payer: Self-pay | Admitting: Cardiology

## 2014-02-15 ENCOUNTER — Other Ambulatory Visit: Payer: Self-pay | Admitting: Cardiology

## 2014-03-21 ENCOUNTER — Ambulatory Visit (INDEPENDENT_AMBULATORY_CARE_PROVIDER_SITE_OTHER): Payer: PRIVATE HEALTH INSURANCE | Admitting: Cardiology

## 2014-03-21 ENCOUNTER — Encounter: Payer: Self-pay | Admitting: Cardiology

## 2014-03-21 VITALS — BP 120/60 | HR 78 | Ht 74.0 in | Wt 269.0 lb

## 2014-03-21 DIAGNOSIS — I2510 Atherosclerotic heart disease of native coronary artery without angina pectoris: Secondary | ICD-10-CM

## 2014-03-21 NOTE — Patient Instructions (Addendum)
The current medical regimen is effective;  continue present plan and medications.  Follow up in 1 year with Dr Hochrein.  You will receive a letter in the mail 2 months before you are due.  Please call us when you receive this letter to schedule your follow up appointment.  

## 2014-03-21 NOTE — Progress Notes (Signed)
HPI The patient presents for followup of his known coronary disease.  I have not seen him since 2013. However, [facility] was [facility] and had a negative stress test for atypical chest pain. He was hospitalized this spring with a broken hip. He was seen in consultation by our service there were no acute cardiac issues. He's had slow recovery from this and is walking with a cane and still has some pain. He has some fleeting chest discomfort that he cannot quantify or qualify this is similar to previous. He denies any shortness of breath, PND or orthopnea. He's had no new palpitations, presyncope or syncope. He has had no weight gain or new edema.  No Known Allergies  Current Outpatient Prescriptions  Medication Sig Dispense Refill  . albuterol (PROVENTIL HFA;VENTOLIN HFA) 108 (90 BASE) MCG/ACT inhaler Inhale 2 puffs into the lungs every 4 (four) hours as needed for wheezing or shortness of breath.      Marland Kitchen. aspirin 81 MG tablet Take 81 mg by mouth daily.      Marland Kitchen. atorvastatin (LIPITOR) 20 MG tablet TAKE 1 TABLET BY MOUTH AT BEDTIME FOR CHOLESTEROL.  30 tablet  0  . citalopram (CELEXA) 40 MG tablet Take 40 mg by mouth daily.      . furosemide (LASIX) 20 MG tablet Take 20 mg by mouth daily.      Marland Kitchen. gabapentin (NEURONTIN) 100 MG capsule Take 100 mg by mouth 3 (three) times daily.      Marland Kitchen. HYDROcodone-acetaminophen (NORCO/VICODIN) 5-325 MG per tablet Take 1-2 tablets by mouth every 6 (six) hours as needed for moderate pain.  30 tablet  0  . insulin detemir (LEVEMIR) 100 UNIT/ML injection Inject 0.25 mLs (25 Units total) into the skin daily.      . naproxen (NAPROSYN) 500 MG tablet 500 mg 2 (two) times daily with a meal.       . potassium chloride (MICRO-K) 10 MEQ CR capsule Take 10 mEq by mouth daily.      . ranitidine (ZANTAC) 300 MG tablet Take 300 mg by mouth at bedtime.       . Tamsulosin HCl (FLOMAX) 0.4 MG CAPS Take 0.4 mg by mouth daily.      Marland Kitchen. SPIRIVA RESPIMAT 2.5  MCG/ACT AERS        No current facility-administered medications for this visit.    Past Medical History  Diagnosis Date  . Hypertension   . Hyperlipidemia   . Diabetes mellitus, type II, insulin dependent   . Coronary atherosclerosis of native coronary artery 2003    Multivessel s/p CABG  . Morbid obesity   . GERD (gastroesophageal reflux disease)   . History of tobacco abuse   . Back pain   . Hard of hearing   . Aortic root dilatation   . CKD (chronic kidney disease) stage 4, GFR 15-29 ml/min     Past Surgical History  Procedure Laterality Date  . Appendectomy    . Lumbar disc surgery      x2  . Coronary artery bypass graft  June 2003    LIMA to LAD, SVG to diagonal, sequential SVG to obtuse marginal and circumf SVG to RCAl  . Angioplasty    . Colonoscopy w/ polypectomy    . Skin cancer excision    . Hip arthroplasty Left 07/25/2013    Procedure: ARTHROPLASTY BIPOLAR HIP;  Surgeon: Darreld McleanWayne Keeling, MD;  Location: [facility];  Service: Orthopedics;  Laterality: Left;  ROS:  As stated in the HPI and negative for all other systems.  PHYSICAL EXAM BP 120/60  Pulse 78  Ht 6\' 2"  (1.88 m)  Wt 269 lb (122.018 kg)  BMI 34.52 kg/m2 GENERAL:  Well appearing HEENT:  Pupils equal round and reactive, fundi not visualized, oral mucosa unremarkable, poor dentition NECK:  No jugular venous distention, waveform within normal limits, carotid upstroke brisk and symmetric, no bruits, no thyromegaly LYMPHATICS:  No cervical, inguinal adenopathy LUNGS:  Clear to auscultation bilaterally BACK:  No CVA tenderness CHEST:  Well healed sternotomy scar. HEART:  PMI not displaced or sustained,S1 and S2 within normal limits, no S3, no S4, no clicks, no rubs, no murmurs ABD:  Flat, positive bowel sounds normal in frequency in pitch, no bruits, no rebound, no guarding, no midline pulsatile mass, no hepatomegaly, no splenomegaly, obese EXT:  2 plus pulses throughout, mild edema, no cyanosis no  clubbing   EKG:  Sinus rhythm, rate 78 , axis within normal limits, intervals within normal limits, no acute ST-T wave changes.  03/21/2014   ASSESSMENT AND PLAN   CAD -  The patient has some atypical chest pain. However, he [facility] and he had no evidence of ischemia on this dobutamine echo. He isn't think his pain has changed. No further workup is suggested.  HYPERTENSION -  His blood pressure is at target. No change in therapy is indicated.  OBESITY, MORBID - The patient has been unable to lose weight over the years. I continue to encourage diet. He can't exercise because of hip pain.  HYPERLIPIDEMIA -  I will send him for a fasting lipid profile.

## 2015-06-06 ENCOUNTER — Inpatient Hospital Stay (HOSPITAL_COMMUNITY)
Admission: EM | Admit: 2015-06-06 | Discharge: 2015-06-11 | DRG: 378 | Disposition: A | Discharge status: Home / Self Care | Payer: Medicare Other | Attending: Internal Medicine | Admitting: Internal Medicine

## 2015-06-06 ENCOUNTER — Encounter (HOSPITAL_COMMUNITY): Payer: Self-pay

## 2015-06-06 ENCOUNTER — Emergency Department (HOSPITAL_COMMUNITY): Payer: Medicare Other

## 2015-06-06 DIAGNOSIS — I10 Essential (primary) hypertension: Secondary | ICD-10-CM | POA: Diagnosis present

## 2015-06-06 DIAGNOSIS — E785 Hyperlipidemia, unspecified: Secondary | ICD-10-CM | POA: Diagnosis present

## 2015-06-06 DIAGNOSIS — H9190 Unspecified hearing loss, unspecified ear: Secondary | ICD-10-CM | POA: Diagnosis present

## 2015-06-06 DIAGNOSIS — Z79891 Long term (current) use of opiate analgesic: Secondary | ICD-10-CM | POA: Diagnosis not present

## 2015-06-06 DIAGNOSIS — E1122 Type 2 diabetes mellitus with diabetic chronic kidney disease: Secondary | ICD-10-CM | POA: Diagnosis present

## 2015-06-06 DIAGNOSIS — D649 Anemia, unspecified: Secondary | ICD-10-CM | POA: Diagnosis present

## 2015-06-06 DIAGNOSIS — R778 Other specified abnormalities of plasma proteins: Secondary | ICD-10-CM

## 2015-06-06 DIAGNOSIS — K254 Chronic or unspecified gastric ulcer with hemorrhage: Principal | ICD-10-CM | POA: Diagnosis present

## 2015-06-06 DIAGNOSIS — R112 Nausea with vomiting, unspecified: Secondary | ICD-10-CM | POA: Insufficient documentation

## 2015-06-06 DIAGNOSIS — Z87891 Personal history of nicotine dependence: Secondary | ICD-10-CM

## 2015-06-06 DIAGNOSIS — I248 Other forms of acute ischemic heart disease: Secondary | ICD-10-CM | POA: Diagnosis present

## 2015-06-06 DIAGNOSIS — Z794 Long term (current) use of insulin: Secondary | ICD-10-CM | POA: Diagnosis not present

## 2015-06-06 DIAGNOSIS — I130 Hypertensive heart and chronic kidney disease with heart failure and stage 1 through stage 4 chronic kidney disease, or unspecified chronic kidney disease: Secondary | ICD-10-CM | POA: Diagnosis present

## 2015-06-06 DIAGNOSIS — Z6837 Body mass index (BMI) 37.0-37.9, adult: Secondary | ICD-10-CM | POA: Diagnosis not present

## 2015-06-06 DIAGNOSIS — N289 Disorder of kidney and ureter, unspecified: Secondary | ICD-10-CM

## 2015-06-06 DIAGNOSIS — Z96642 Presence of left artificial hip joint: Secondary | ICD-10-CM | POA: Diagnosis present

## 2015-06-06 DIAGNOSIS — Z85828 Personal history of other malignant neoplasm of skin: Secondary | ICD-10-CM | POA: Diagnosis not present

## 2015-06-06 DIAGNOSIS — E119 Type 2 diabetes mellitus without complications: Secondary | ICD-10-CM

## 2015-06-06 DIAGNOSIS — K2960 Other gastritis without bleeding: Secondary | ICD-10-CM | POA: Diagnosis present

## 2015-06-06 DIAGNOSIS — Z951 Presence of aortocoronary bypass graft: Secondary | ICD-10-CM

## 2015-06-06 DIAGNOSIS — Z7982 Long term (current) use of aspirin: Secondary | ICD-10-CM

## 2015-06-06 DIAGNOSIS — Z8249 Family history of ischemic heart disease and other diseases of the circulatory system: Secondary | ICD-10-CM | POA: Diagnosis not present

## 2015-06-06 DIAGNOSIS — K922 Gastrointestinal hemorrhage, unspecified: Secondary | ICD-10-CM | POA: Diagnosis present

## 2015-06-06 DIAGNOSIS — F329 Major depressive disorder, single episode, unspecified: Secondary | ICD-10-CM | POA: Diagnosis present

## 2015-06-06 DIAGNOSIS — Z79899 Other long term (current) drug therapy: Secondary | ICD-10-CM | POA: Diagnosis not present

## 2015-06-06 DIAGNOSIS — K2980 Duodenitis without bleeding: Secondary | ICD-10-CM | POA: Diagnosis present

## 2015-06-06 DIAGNOSIS — E669 Obesity, unspecified: Secondary | ICD-10-CM | POA: Diagnosis present

## 2015-06-06 DIAGNOSIS — N184 Chronic kidney disease, stage 4 (severe): Secondary | ICD-10-CM | POA: Diagnosis not present

## 2015-06-06 DIAGNOSIS — D500 Iron deficiency anemia secondary to blood loss (chronic): Secondary | ICD-10-CM | POA: Diagnosis not present

## 2015-06-06 DIAGNOSIS — R197 Diarrhea, unspecified: Secondary | ICD-10-CM

## 2015-06-06 DIAGNOSIS — I2510 Atherosclerotic heart disease of native coronary artery without angina pectoris: Secondary | ICD-10-CM | POA: Diagnosis present

## 2015-06-06 DIAGNOSIS — D62 Acute posthemorrhagic anemia: Secondary | ICD-10-CM | POA: Diagnosis present

## 2015-06-06 DIAGNOSIS — I472 Ventricular tachycardia: Secondary | ICD-10-CM | POA: Diagnosis not present

## 2015-06-06 DIAGNOSIS — I5032 Chronic diastolic (congestive) heart failure: Secondary | ICD-10-CM | POA: Diagnosis present

## 2015-06-06 DIAGNOSIS — R7989 Other specified abnormal findings of blood chemistry: Secondary | ICD-10-CM | POA: Diagnosis not present

## 2015-06-06 DIAGNOSIS — I25709 Atherosclerosis of coronary artery bypass graft(s), unspecified, with unspecified angina pectoris: Secondary | ICD-10-CM | POA: Diagnosis not present

## 2015-06-06 DIAGNOSIS — K921 Melena: Secondary | ICD-10-CM | POA: Diagnosis not present

## 2015-06-06 LAB — COMPREHENSIVE METABOLIC PANEL
ALT: 13 U/L — ABNORMAL LOW (ref 17–63)
AST: 23 U/L (ref 15–41)
Albumin: 3.1 g/dL — ABNORMAL LOW (ref 3.5–5.0)
Alkaline Phosphatase: 46 U/L (ref 38–126)
Anion gap: 13 (ref 5–15)
BUN: 99 mg/dL — ABNORMAL HIGH (ref 6–20)
CHLORIDE: 108 mmol/L (ref 101–111)
CO2: 21 mmol/L — ABNORMAL LOW (ref 22–32)
CREATININE: 3.41 mg/dL — AB (ref 0.61–1.24)
Calcium: 9.3 mg/dL (ref 8.9–10.3)
GFR calc non Af Amer: 16 mL/min — ABNORMAL LOW (ref >60)
GFR, EST AFRICAN AMERICAN: 18 mL/min — AB (ref >60)
GLUCOSE: 180 mg/dL — AB (ref 65–99)
Potassium: 4.8 mmol/L (ref 3.5–5.1)
SODIUM: 142 mmol/L (ref 135–145)
Total Bilirubin: 0.6 mg/dL (ref 0.3–1.2)
Total Protein: 6.7 g/dL (ref 6.5–8.1)

## 2015-06-06 LAB — CBC WITH DIFFERENTIAL/PLATELET
BASOS PCT: 0 %
Basophils Absolute: 0 10*3/uL (ref 0.0–0.1)
EOS ABS: 0 10*3/uL (ref 0.0–0.7)
Eosinophils Relative: 0 %
HEMATOCRIT: 23.8 % — AB (ref 39.0–52.0)
HEMOGLOBIN: 7.8 g/dL — AB (ref 13.0–17.0)
Lymphocytes Relative: 14 %
Lymphs Abs: 1.8 10*3/uL (ref 0.7–4.0)
MCH: 30.5 pg (ref 26.0–34.0)
MCHC: 32.8 g/dL (ref 30.0–36.0)
MCV: 93 fL (ref 78.0–100.0)
Monocytes Absolute: 1.3 10*3/uL — ABNORMAL HIGH (ref 0.1–1.0)
Monocytes Relative: 10 %
NEUTROS ABS: 9.4 10*3/uL — AB (ref 1.7–7.7)
Neutrophils Relative %: 76 %
Platelets: 236 10*3/uL (ref 150–400)
RBC: 2.56 MIL/uL — AB (ref 4.22–5.81)
RDW: 14.4 % (ref 11.5–15.5)
WBC: 12.6 10*3/uL — AB (ref 4.0–10.5)

## 2015-06-06 LAB — LACTIC ACID, PLASMA
LACTIC ACID, VENOUS: 2.4 mmol/L — AB (ref 0.5–2.0)
Lactic Acid, Venous: 1.9 mmol/L (ref 0.5–2.0)

## 2015-06-06 LAB — SAMPLE TO BLOOD BANK

## 2015-06-06 LAB — TROPONIN I
TROPONIN I: 2.14 ng/mL — AB (ref <0.031)
Troponin I: 2.03 ng/mL (ref <0.031)

## 2015-06-06 LAB — POC OCCULT BLOOD, ED: FECAL OCCULT BLD: POSITIVE — AB

## 2015-06-06 LAB — PROTIME-INR
INR: 1.25 (ref 0.00–1.49)
PROTHROMBIN TIME: 15.9 s — AB (ref 11.6–15.2)

## 2015-06-06 LAB — LIPASE, BLOOD: Lipase: 82 U/L — ABNORMAL HIGH (ref 11–51)

## 2015-06-06 LAB — PREPARE RBC (CROSSMATCH)

## 2015-06-06 MED ORDER — SODIUM CHLORIDE 0.9 % IV SOLN: INTRAVENOUS | Status: DC

## 2015-06-06 MED ORDER — ONDANSETRON HCL 4 MG/2ML IJ SOLN
4.0000 mg | INTRAMUSCULAR | Status: DC | PRN
  Administered 2015-06-06: 4 mg via INTRAVENOUS
  Filled 2015-06-06: qty 2

## 2015-06-06 MED ORDER — ONDANSETRON HCL 4 MG/2ML IJ SOLN: 4.0000 mg | Freq: Three times a day (TID) | INTRAMUSCULAR | Status: DC | PRN

## 2015-06-06 MED ORDER — SODIUM CHLORIDE 0.9 % IV SOLN
10.0000 mL/h | Freq: Once | INTRAVENOUS | Status: AC
  Administered 2015-06-06: 10 mL/h via INTRAVENOUS

## 2015-06-06 MED ORDER — PANTOPRAZOLE SODIUM 40 MG IV SOLR
80.0000 mg | Freq: Once | INTRAVENOUS | Status: AC
  Administered 2015-06-06: 80 mg via INTRAVENOUS
  Filled 2015-06-06: qty 80

## 2015-06-06 MED ORDER — SODIUM CHLORIDE 0.9 % IJ SOLN: 3.0000 mL | Freq: Two times a day (BID) | INTRAMUSCULAR | Status: DC

## 2015-06-06 MED ORDER — ONDANSETRON HCL 4 MG PO TABS
4.0000 mg | ORAL_TABLET | Freq: Four times a day (QID) | ORAL | Status: DC | PRN
  Filled 2015-06-06: qty 1

## 2015-06-06 MED ORDER — SODIUM CHLORIDE 0.9 % IV SOLN
INTRAVENOUS | Status: DC
  Administered 2015-06-06 – 2015-06-08 (×4): via INTRAVENOUS

## 2015-06-06 MED ORDER — INSULIN ASPART 100 UNIT/ML ~~LOC~~ SOLN
0.0000 [IU] | Freq: Three times a day (TID) | SUBCUTANEOUS | Status: DC
  Administered 2015-06-07 – 2015-06-08 (×4): 1 [IU] via SUBCUTANEOUS
  Filled 2015-06-06 (×25): qty 0.09

## 2015-06-06 MED ORDER — PANTOPRAZOLE SODIUM 40 MG IV SOLR
INTRAVENOUS | Status: AC
  Filled 2015-06-06: qty 160

## 2015-06-06 MED ORDER — ONDANSETRON HCL 4 MG/2ML IJ SOLN: 4.0000 mg | Freq: Four times a day (QID) | INTRAMUSCULAR | Status: DC | PRN

## 2015-06-06 MED ORDER — PANTOPRAZOLE SODIUM 40 MG IV SOLR
8.0000 mg/h | INTRAVENOUS | Status: AC
  Administered 2015-06-07 – 2015-06-09 (×5): 8 mg/h via INTRAVENOUS
  Filled 2015-06-06 (×8): qty 80

## 2015-06-06 NOTE — H&P (Signed)
[facility] History and Physical  [name] UEA:[id number] DOB: 1934-02-07 DOA: 06/06/2015  Referring physician: ER PCP: Joycelyn RuaMEYERS, STEPHEN, MD   Chief Complaint: Hematemesis and melena.  HPI: [name] is a 82 y.o. male  This is a 82 year old man who started having black stool diarrhea today associated with hematemesis which was coffee-ground/black in color. He denies any red rectal bleeding. There is no significant abdominal pain. There is no fever. He has been feeling somewhat lightheaded. He has a history of chronic renal failure, hypertension and diabetes. In the emergency room, he is hemodynamically stable, but his hemoglobin is 7.8 and fecal occult blood is positive. He is now being admitted for further management of his GI bleed.   Review of Systems:  Apart from symptoms above, all systems are negative.  Past Medical History  Diagnosis Date  . Hypertension   . Hyperlipidemia   . Diabetes mellitus, type II, insulin dependent (HCC)   . Coronary atherosclerosis of native coronary artery 2003    Multivessel s/p CABG  . Morbid obesity (HCC)   . GERD (gastroesophageal reflux disease)   . History of tobacco abuse   . Back pain   . Hard of hearing   . Aortic root dilatation (HCC)   . CKD (chronic kidney disease) stage 4, GFR 15-29 ml/min [facility](HCC)    Past Surgical History  Procedure Laterality Date  . Appendectomy    . Lumbar disc surgery      x2  . Coronary artery bypass graft  June 2003    LIMA to LAD, SVG to diagonal, sequential SVG to obtuse marginal and circumf SVG to RCAl  . Angioplasty    . Colonoscopy w/ polypectomy    . Skin cancer excision    . Hip arthroplasty Left 07/25/2013    Procedure: ARTHROPLASTY BIPOLAR HIP;  Surgeon: Darreld McleanWayne Keeling, MD;  Location: [facility];  Service: Orthopedics;  Laterality: Left;   Social History:  reports that he quit smoking about 15 years ago. His smoking use included Cigarettes. He has a 60 pack-year smoking history. He  does not have any smokeless tobacco history on file. He reports that he does not drink alcohol or use illicit drugs.  No Known Allergies  Family History  Problem Relation Age of Onset  . Heart disease Father   . Heart attack Child     Prior to Admission medications   Medication Sig Start Date End Date Taking? Authorizing Provider  albuterol (PROVENTIL HFA;VENTOLIN HFA) 108 (90 BASE) MCG/ACT inhaler Inhale 2 puffs into the lungs every 4 (four) hours as needed for wheezing or shortness of breath.   Yes Historical Provider, MD  aspirin 81 MG tablet Take 81 mg by mouth daily.   Yes Historical Provider, MD  atorvastatin (LIPITOR) 20 MG tablet TAKE 1 TABLET BY MOUTH AT BEDTIME FOR CHOLESTEROL. 02/15/14  Yes Rollene RotundaJames Hochrein, MD  citalopram (CELEXA) 40 MG tablet Take 40 mg by mouth daily.   Yes Historical Provider, MD  furosemide (LASIX) 20 MG tablet Take 40 mg by mouth daily.    Yes Historical Provider, MD  gabapentin (NEURONTIN) 100 MG capsule Take 100 mg by mouth 3 (three) times daily.   Yes Historical Provider, MD  HYDROcodone-acetaminophen (NORCO/VICODIN) 5-325 MG per tablet Take 1-2 tablets by mouth every 6 (six) hours as needed for moderate pain. 07/30/13  Yes Standley Brookinganiel P Goodrich, MD  insulin detemir (LEVEMIR) 100 UNIT/ML injection Inject 0.25 mLs (25 Units total) into the skin daily. Patient taking differently: Inject  25 Units into the skin daily as needed (for high sugar levels).  07/30/13  Yes Standley Brooking, MD  naproxen (NAPROSYN) 500 MG tablet 500 mg 2 (two) times daily with a meal.  03/19/14  Yes Historical Provider, MD  pioglitazone (ACTOS) 45 MG tablet Take 45 mg by mouth daily.  05/22/15  Yes Historical Provider, MD  potassium chloride (MICRO-K) 10 MEQ CR capsule Take 10 mEq by mouth daily.   Yes Historical Provider, MD  ranitidine (ZANTAC) 300 MG tablet Take 300 mg by mouth at bedtime.  03/07/14  Yes Historical Provider, MD  SPIRIVA RESPIMAT 2.5 MCG/ACT AERS Inhale 1 puff into the lungs  daily.  03/07/14  Yes Historical Provider, MD  cefdinir (OMNICEF) 300 MG capsule  05/22/15   Historical Provider, MD   Physical Exam: Filed Vitals:   06/06/15 2000 06/06/15 2018 06/06/15 2100 06/06/15 2140  BP: 120/62 120/62 134/60 112/42  Pulse: 100 94 92 98  Temp:      TempSrc:      Resp: 19 18 23 18   Height:      Weight:      SpO2: 96% 98% 98% 98%    Wt Readings from Last 3 Encounters:  06/06/15 104.327 kg (230 lb)  03/21/14 122.018 kg (269 lb)  07/23/13 122.199 kg (269 lb 6.4 oz)    General:  Appears pale. He is hemodynamically stable. He does look clinically dehydrated. Eyes: PERRL, normal lids, irises & conjunctiva ENT: grossly normal hearing, lips & tongue Neck: no LAD, masses or thyromegaly Cardiovascular: RRR, no m/r/g. No LE edema. Telemetry: SR, no arrhythmias  Respiratory: CTA bilaterally, no w/r/r. Normal respiratory effort. Abdomen: soft, ntnd. Rectal examination not done but fecal occult blood is positive. No evidence of an acute abdomen clinically. Skin: no rash or induration seen on limited exam Musculoskeletal: grossly normal tone BUE/BLE Psychiatric: grossly normal mood and affect, speech fluent and appropriate Neurologic: grossly non-focal.          Labs on Admission:  Basic Metabolic Panel:  Recent Labs Lab 06/06/15 2035  NA 142  K 4.8  CL 108  CO2 21*  GLUCOSE 180*  BUN 99*  CREATININE 3.41*  CALCIUM 9.3   Liver Function Tests:  Recent Labs Lab 06/06/15 2035  AST 23  ALT 13*  ALKPHOS 46  BILITOT 0.6  PROT 6.7  ALBUMIN 3.1*    Recent Labs Lab 06/06/15 2035  LIPASE 82*   No results for input(s): AMMONIA in the last 168 hours. CBC:  Recent Labs Lab 06/06/15 2035  WBC 12.6*  NEUTROABS 9.4*  HGB 7.8*  HCT 23.8*  MCV 93.0  PLT 236   Cardiac Enzymes:  Recent Labs Lab 06/06/15 2035  TROPONINI 2.14*    BNP (last 3 results) No results for input(s): BNP in the last 8760 hours.  ProBNP (last 3 results) No results  for input(s): PROBNP in the last 8760 hours.  CBG: No results for input(s): GLUCAP in the last 168 hours.  Radiological Exams on Admission: Dg Chest Port 1 View  06/06/2015  CLINICAL DATA:  Nausea, vomiting and weakness. Intermittent shortness of breath. EXAM: PORTABLE CHEST 1 VIEW COMPARISON:  05/22/2015 FINDINGS: Patient is post median sternotomy. Cardiomediastinal contours are unchanged allowing for differences in technique with borderline cardiomegaly and tortuous atherosclerotic thoracic aorta. No pulmonary edema, pleural effusion or pneumothorax. No confluent airspace disease. Osseous structures are unchanged. IMPRESSION: No acute pulmonary process. Electronically Signed   By: Rubye Oaks M.D.   On: 06/06/2015  21:20    EKG: Independently reviewed. Sinus rhythm without any acute ST-T wave changes.  Assessment/Plan   1. GI bleed. This appears to be an upper GI bleed clinically. Nothing by mouth. Protonix intravenously drip. Gastroenterology consultation with a view to upper GI endoscopy. 2. Acute on chronic kidney disease. His creatinine is above his baseline as his BUN. Part of the BUN rise may be due to the GI bleed. However I think he is overall hypovolemic and I will give him IV fluids. Monitor renal function closely. 3. Acute blood loss anemia. This is from the GI bleed. Give 2 units of blood transfusion. 4. Diabetes. Sliding scale of insulin. 5. Hypertension. Patient's blood pressure is acceptable at the present time. Monitor closely. 6. Elevated troponin. Cycle troponin levels. Cardiology consultation for further recommendations. He does not really have any symptoms of acute coronary syndrome.  He'll be admitted to telemetry floor. Further recommendations will depend on patient's hospital progress.   Code Status: Full code.   DVT Prophylaxis: SCDs.  Family Communication: I discussed the plan with the patient at the bedside.   Disposition Plan: Home when medically stable.    Time spent: 60 minutes.  Wilson Singer [facility] Pager [phone].

## 2015-06-06 NOTE — ED Provider Notes (Signed)
CSN: [id number]     Arrival date & time 06/06/15  [id number] History   First MD Initiated Contact with Patient 06/06/15 1937     Chief Complaint  Patient presents with  . GI Bleeding      HPI  Pt was seen at 1950. Per pt and his family, c/o gradual onset and persistence of multiple intermittent episodes of N/V/D since yesterday. Describes the diarrhea and emesis as "dark."  Denies red blood in stools or emesis, no abd pain, no back pain, no CP/SOB, no fevers.    Past Medical History  Diagnosis Date  . Hypertension   . Hyperlipidemia   . Diabetes mellitus, type II, insulin dependent (HCC)   . Coronary atherosclerosis of native coronary artery 2003    Multivessel s/p CABG  . Morbid obesity (HCC)   . GERD (gastroesophageal reflux disease)   . History of tobacco abuse   . Back pain   . Hard of hearing   . Aortic root dilatation (HCC)   . CKD (chronic kidney disease) stage 4, GFR 15-29 ml/min [facility](HCC)    Past Surgical History  Procedure Laterality Date  . Appendectomy    . Lumbar disc surgery      x2  . Coronary artery bypass graft  June 2003    LIMA to LAD, SVG to diagonal, sequential SVG to obtuse marginal and circumf SVG to RCAl  . Angioplasty    . Colonoscopy w/ polypectomy    . Skin cancer excision    . Hip arthroplasty Left 07/25/2013    Procedure: ARTHROPLASTY BIPOLAR HIP;  Surgeon: Darreld McleanWayne Keeling, MD;  Location: [facility];  Service: Orthopedics;  Laterality: Left;   Family History  Problem Relation Age of Onset  . Heart disease Father   . Heart attack Child    Social History  Substance Use Topics  . Smoking status: Former Smoker -- 1.00 packs/day for 60 years    Types: Cigarettes    Quit date: 05/25/2000  . Smokeless tobacco: None  . Alcohol Use: No    Review of Systems ROS: Statement: All systems negative except as marked or noted in the HPI; Constitutional: Negative for fever and chills. ; ; Eyes: Negative for eye pain, redness and discharge. ; ; ENMT: Negative for ear  pain, hoarseness, nasal congestion, sinus pressure and sore throat. ; ; Cardiovascular: Negative for chest pain, palpitations, diaphoresis, dyspnea and peripheral edema. ; ; Respiratory: Negative for cough, wheezing and stridor. ; ; Gastrointestinal: +N/V/D, "black stools." Negative for abdominal pain, blood in stool, hematemesis, jaundice and rectal bleeding. . ; ; Genitourinary: Negative for dysuria, flank pain and hematuria. ; ; Musculoskeletal: Negative for back pain and neck pain. Negative for swelling and trauma.; ; Skin: Negative for pruritus, rash, abrasions, blisters, bruising and skin lesion.; ; Neuro: Negative for headache, lightheadedness and neck stiffness. Negative for weakness, altered level of consciousness , altered mental status, extremity weakness, paresthesias, involuntary movement, seizure and syncope.      Allergies  Review of patient's allergies indicates no known allergies.  Home Medications   Prior to Admission medications   Medication Sig Start Date End Date Taking? Authorizing Provider  albuterol (PROVENTIL HFA;VENTOLIN HFA) 108 (90 BASE) MCG/ACT inhaler Inhale 2 puffs into the lungs every 4 (four) hours as needed for wheezing or shortness of breath.    Historical Provider, MD  aspirin 81 MG tablet Take 81 mg by mouth daily.    Historical Provider, MD  atorvastatin (LIPITOR) 20 MG tablet TAKE 1  TABLET BY MOUTH AT BEDTIME FOR CHOLESTEROL. 02/15/14   Rollene Rotunda, MD  cefdinir (OMNICEF) 300 MG capsule  05/22/15   Historical Provider, MD  citalopram (CELEXA) 40 MG tablet Take 40 mg by mouth daily.    Historical Provider, MD  furosemide (LASIX) 20 MG tablet Take 20 mg by mouth daily.    Historical Provider, MD  gabapentin (NEURONTIN) 100 MG capsule Take 100 mg by mouth 3 (three) times daily.    Historical Provider, MD  HYDROcodone-acetaminophen [facility]) 7.5-325 MG tablet  05/26/15   Historical Provider, MD  HYDROcodone-acetaminophen (NORCO/VICODIN) 5-325 MG per tablet Take 1-2  tablets by mouth every 6 (six) hours as needed for moderate pain. 07/30/13   Standley Brooking, MD  insulin detemir (LEVEMIR) 100 UNIT/ML injection Inject 0.25 mLs (25 Units total) into the skin daily. 07/30/13   Standley Brooking, MD  naproxen (NAPROSYN) 500 MG tablet 500 mg 2 (two) times daily with a meal.  03/19/14   Historical Provider, MD  pioglitazone (ACTOS) 45 MG tablet  05/22/15   Historical Provider, MD  potassium chloride (K-DUR) 10 MEQ tablet  05/22/15   Historical Provider, MD  potassium chloride (MICRO-K) 10 MEQ CR capsule Take 10 mEq by mouth daily.    Historical Provider, MD  ranitidine (ZANTAC) 300 MG tablet Take 300 mg by mouth at bedtime.  03/07/14   Historical Provider, MD  SPIRIVA RESPIMAT 2.5 MCG/ACT AERS  03/07/14   Historical Provider, MD  Tamsulosin HCl (FLOMAX) 0.4 MG CAPS Take 0.4 mg by mouth daily.    Historical Provider, MD   BP 120/62 mmHg  Pulse 94  Temp(Src) 98 F (36.7 C) (Oral)  Resp 18  Ht 6' (1.829 m)  Wt 230 lb (104.327 kg)  BMI 31.19 kg/m2  SpO2 98%   Filed Vitals:   06/06/15 1939 06/06/15 2000 06/06/15 2018 06/06/15 2100  BP: 120/53 120/62 120/62 134/60  Pulse: 100 100 94 92  Temp: 98 F (36.7 C)     TempSrc: Oral     Resp: 22 19 18 23   Height: 6' (1.829 m)     Weight: 230 lb (104.327 kg)     SpO2: 96% 96% 98% 98%      Physical Exam  1953: Physical examination:  Nursing notes reviewed; Vital signs and O2 SAT reviewed;  Constitutional: Well developed, Well nourished, Well hydrated, In no acute distress; Head:  Normocephalic, atraumatic; Eyes: EOMI, PERRL, No scleral icterus; ENMT: Mouth and pharynx normal, Mucous membranes moist; Neck: Supple, Full range of motion, No lymphadenopathy; Cardiovascular: Regular rate and rhythm, No gallop; Respiratory: Breath sounds clear & equal bilaterally, No wheezes.  Speaking full sentences with ease, Normal respiratory effort/excursion; Chest: Nontender, Movement normal; Abdomen: Soft, Nontender, Nondistended,  Normal bowel sounds. Rectal exam performed w/permission of pt and ED RN chaperone present.  Anal tone normal.  Non-tender, soft black stool in rectal vault, heme positive.  No fissures, no external hemorrhoids, no palp masses.;; Genitourinary: No CVA tenderness; Extremities: Pulses normal, No tenderness, No edema, No calf edema or asymmetry.; Neuro: AA&Ox3, +HOH, otherwise major CN grossly intact.  Speech clear. No gross focal motor or sensory deficits in extremities.; Skin: Color pale, Warm, Dry.   ED Course  Procedures (including critical care time) Labs Review   Imaging Review  I have personally reviewed and evaluated these images and lab results as part of my medical decision-making.   EKG Interpretation   Date/Time:  Thursday June 06 2015 19:38:24 EST Ventricular Rate:  97 PR  Interval:  150 QRS Duration: 87 QT Interval:  343 QTC Calculation: 436 R Axis:   16 Text Interpretation:  Sinus rhythm Ventricular premature complex  Nonspecific T abnormalities, lateral leads When compared with ECG of  07/29/2013 No significant change was found Confirmed by [facility]  MD,  Nicholos Johns [phone]) on 06/06/2015 8:42:23 PM      MDM  MDM Reviewed: previous chart, nursing note and vitals Reviewed previous: labs and ECG Interpretation: labs, ECG and x-ray Total time providing critical care: 30-74 minutes. This excludes time spent performing separately reportable procedures and services. Consults: admitting MD     CRITICAL CARE Performed by: Laray Anger Total critical care time: 35 minutes Critical care time was exclusive of separately billable procedures and treating other patients. Critical care was necessary to treat or prevent imminent or life-threatening deterioration. Critical care was time spent personally by me on the following activities: development of treatment plan with patient and/or surrogate as well as nursing, discussions with consultants, evaluation of patient's  response to treatment, examination of patient, obtaining history from patient or surrogate, ordering and performing treatments and interventions, ordering and review of laboratory studies, ordering and review of radiographic studies, pulse oximetry and re-evaluation of patient's condition.   Results for orders placed or performed during the hospital encounter of 06/06/15  Comprehensive metabolic panel  Result Value Ref Range   Sodium 142 135 - 145 mmol/L   Potassium 4.8 3.5 - 5.1 mmol/L   Chloride 108 101 - 111 mmol/L   CO2 21 (L) 22 - 32 mmol/L   Glucose, Bld 180 (H) 65 - 99 mg/dL   BUN 99 (H) 6 - 20 mg/dL   Creatinine, Ser 6.04 (H) 0.61 - 1.24 mg/dL   Calcium 9.3 8.9 - 54.0 mg/dL   Total Protein 6.7 6.5 - 8.1 g/dL   Albumin 3.1 (L) 3.5 - 5.0 g/dL   AST 23 15 - 41 U/L   ALT 13 (L) 17 - 63 U/L   Alkaline Phosphatase 46 38 - 126 U/L   Total Bilirubin 0.6 0.3 - 1.2 mg/dL   GFR calc non Af Amer 16 (L) >60 mL/min   GFR calc Af Amer 18 (L) >60 mL/min   Anion gap 13 5 - 15  Lipase, blood  Result Value Ref Range   Lipase 82 (H) 11 - 51 U/L  Troponin I  Result Value Ref Range   Troponin I 2.14 (HH) <0.031 ng/mL  Lactic acid, plasma  Result Value Ref Range   Lactic Acid, Venous 2.4 (HH) 0.5 - 2.0 mmol/L  CBC with Differential  Result Value Ref Range   WBC 12.6 (H) 4.0 - 10.5 K/uL   RBC 2.56 (L) 4.22 - 5.81 MIL/uL   Hemoglobin 7.8 (L) 13.0 - 17.0 g/dL   HCT 98.1 (L) 19.1 - 47.8 %   MCV 93.0 78.0 - 100.0 fL   MCH 30.5 26.0 - 34.0 pg   MCHC 32.8 30.0 - 36.0 g/dL   RDW 29.5 62.1 - 30.8 %   Platelets 236 150 - 400 K/uL   Neutrophils Relative % 76 %   Neutro Abs 9.4 (H) 1.7 - 7.7 K/uL   Lymphocytes Relative 14 %   Lymphs Abs 1.8 0.7 - 4.0 K/uL   Monocytes Relative 10 %   Monocytes Absolute 1.3 (H) 0.1 - 1.0 K/uL   Eosinophils Relative 0 %   Eosinophils Absolute 0.0 0.0 - 0.7 K/uL   Basophils Relative 0 %   Basophils Absolute 0.0 0.0 - 0.1  K/uL  Protime-INR  Result Value Ref  Range   Prothrombin Time 15.9 (H) 11.6 - 15.2 seconds   INR 1.25 0.00 - 1.49  POC occult blood, ED  Result Value Ref Range   Fecal Occult Bld POSITIVE (A) NEGATIVE  Sample to Blood Bank  Result Value Ref Range   Blood Bank Specimen SAMPLE AVAILABLE FOR TESTING    Sample Expiration 06/09/2015    Dg Chest Port 1 View 06/06/2015  CLINICAL DATA:  Nausea, vomiting and weakness. Intermittent shortness of breath. EXAM: PORTABLE CHEST 1 VIEW COMPARISON:  05/22/2015 FINDINGS: Patient is post median sternotomy. Cardiomediastinal contours are unchanged allowing for differences in technique with borderline cardiomegaly and tortuous atherosclerotic thoracic aorta. No pulmonary edema, pleural effusion or pneumothorax. No confluent airspace disease. Osseous structures are unchanged. IMPRESSION: No acute pulmonary process. Electronically Signed   By: Rubye Oaks M.D.   On: 06/06/2015 21:20   Results for JAXTIN, RAIMONDO (MRN [id number]) as of 06/06/2015 21:41  Ref. Range 07/26/2013 05:22 07/27/2013 04:40 07/28/2013 05:38 07/29/2013 05:51 06/06/2015 20:35  Hemoglobin Latest Ref Range: 13.0-17.0 g/dL 9.2 (L) 8.6 (L) 46.9 (L) 10.8 (L) 7.8 (L)  HCT Latest Ref Range: 39.0-52.0 % 27.2 (L) 24.8 (L) 31.0 (L) 31.1 (L) 23.8 (L)   Results for LEA, WALBERT (MRN [id number]) as of 06/06/2015 21:41  Ref. Range 07/28/2013 05:38 07/29/2013 05:51 07/30/2013 06:41 06/06/2015 20:35  BUN Latest Ref Range: 6-20 mg/dL 56 (H) 54 (H) 57 (H) 99 (H)  Creatinine Latest Ref Range: 0.61-1.24 mg/dL 2.44 (H) 0.10 (H) 2.72 (H) 3.41 (H)   2130:   IV protonix bolus and gtt started. H/H lower than previous; will transfuse PRBC's. VS remain stable while in the ED. Troponin elevated, but EKG without significant changes from previous and pt denies CP. Dx and testing d/w pt and family.  Questions answered.  Verb understanding, agreeable to admit.  T/C to [facility]. Karilyn Cota, case discussed, including:  HPI, pertinent PM/SHx, VS/PE, dx testing, ED course and  treatment:  Agreeable to admit, requests to write temporary orders, obtain tele bed to team [facility].   Corey Jester, [name] 06/08/15 1743

## 2015-06-06 NOTE — ED Notes (Signed)
CRITICAL VALUE ALERT  Critical value received:  Troponin 2.14  Date of notification:  06/05/14  Time of notification:  2117  Critical value read back:Yes.    Nurse who received alert:  BKN  MD notified (1st page):  Clarene DukeMcManus  Time of first page:  2117  MD notified (2nd page):  Time of second page:  Responding MD:    Time MD responded:

## 2015-06-06 NOTE — ED Notes (Signed)
CRITICAL VALUE ALERT  Critical value received: Lactic Acid 2.4  Date of notification:  06/05/14  Time of notification:  2111  Critical value read back:Yes.    Nurse who received alert:  BKN  MD notified (1st page):  Clarene DukeMcManus  Time of first page:  2111  MD notified (2nd page):  Time of second page:  Responding MD:    Time MD responded:

## 2015-06-06 NOTE — ED Notes (Signed)
Pt in by ems from home with c/o rectal bleeding onset last night.  Pt has also been vomiting per ems but unknown if coffee ground appearance

## 2015-06-07 ENCOUNTER — Encounter (HOSPITAL_COMMUNITY)
Admission: EM | Disposition: A | Discharge status: Home / Self Care | Payer: Self-pay | Source: Home / Self Care | Attending: Internal Medicine

## 2015-06-07 ENCOUNTER — Inpatient Hospital Stay (HOSPITAL_COMMUNITY): Payer: Medicare Other

## 2015-06-07 DIAGNOSIS — K922 Gastrointestinal hemorrhage, unspecified: Secondary | ICD-10-CM

## 2015-06-07 DIAGNOSIS — K921 Melena: Secondary | ICD-10-CM

## 2015-06-07 DIAGNOSIS — N184 Chronic kidney disease, stage 4 (severe): Secondary | ICD-10-CM

## 2015-06-07 DIAGNOSIS — I10 Essential (primary) hypertension: Secondary | ICD-10-CM

## 2015-06-07 DIAGNOSIS — R7989 Other specified abnormal findings of blood chemistry: Secondary | ICD-10-CM

## 2015-06-07 DIAGNOSIS — R112 Nausea with vomiting, unspecified: Secondary | ICD-10-CM

## 2015-06-07 DIAGNOSIS — I25709 Atherosclerosis of coronary artery bypass graft(s), unspecified, with unspecified angina pectoris: Secondary | ICD-10-CM

## 2015-06-07 DIAGNOSIS — R778 Other specified abnormalities of plasma proteins: Secondary | ICD-10-CM | POA: Insufficient documentation

## 2015-06-07 DIAGNOSIS — N178 Other acute kidney failure: Secondary | ICD-10-CM

## 2015-06-07 DIAGNOSIS — I248 Other forms of acute ischemic heart disease: Secondary | ICD-10-CM

## 2015-06-07 DIAGNOSIS — R197 Diarrhea, unspecified: Secondary | ICD-10-CM

## 2015-06-07 HISTORY — PX: ESOPHAGOGASTRODUODENOSCOPY: SHX5428

## 2015-06-07 LAB — CBC
HCT: 27.7 % — ABNORMAL LOW (ref 39.0–52.0)
Hemoglobin: 9.2 g/dL — ABNORMAL LOW (ref 13.0–17.0)
MCH: 30.7 pg (ref 26.0–34.0)
MCHC: 33.2 g/dL (ref 30.0–36.0)
MCV: 92.3 fL (ref 78.0–100.0)
PLATELETS: 191 10*3/uL (ref 150–400)
RBC: 3 MIL/uL — AB (ref 4.22–5.81)
RDW: 14.5 % (ref 11.5–15.5)
WBC: 10.5 10*3/uL (ref 4.0–10.5)

## 2015-06-07 LAB — COMPREHENSIVE METABOLIC PANEL
ALBUMIN: 3.1 g/dL — AB (ref 3.5–5.0)
ALK PHOS: 43 U/L (ref 38–126)
ALT: 11 U/L — AB (ref 17–63)
AST: 21 U/L (ref 15–41)
Anion gap: 10 (ref 5–15)
BUN: 103 mg/dL — AB (ref 6–20)
CALCIUM: 9 mg/dL (ref 8.9–10.3)
CHLORIDE: 110 mmol/L (ref 101–111)
CO2: 22 mmol/L (ref 22–32)
CREATININE: 3.45 mg/dL — AB (ref 0.61–1.24)
GFR calc Af Amer: 18 mL/min — ABNORMAL LOW (ref >60)
GFR calc non Af Amer: 16 mL/min — ABNORMAL LOW (ref >60)
GLUCOSE: 170 mg/dL — AB (ref 65–99)
Potassium: 5 mmol/L (ref 3.5–5.1)
SODIUM: 142 mmol/L (ref 135–145)
Total Bilirubin: 0.7 mg/dL (ref 0.3–1.2)
Total Protein: 6.5 g/dL (ref 6.5–8.1)

## 2015-06-07 LAB — TROPONIN I
Troponin I: 2.26 ng/mL (ref <0.031)
Troponin I: 1.82 ng/mL (ref <0.031)
Troponin I: 1.67 ng/mL (ref <0.031)

## 2015-06-07 LAB — GLUCOSE, CAPILLARY
GLUCOSE-CAPILLARY: 172 mg/dL — AB (ref 65–99)
GLUCOSE-CAPILLARY: 132 mg/dL — AB (ref 65–99)
GLUCOSE-CAPILLARY: 125 mg/dL — AB (ref 65–99)
Glucose-Capillary: 142 mg/dL — ABNORMAL HIGH (ref 65–99)
Glucose-Capillary: 138 mg/dL — ABNORMAL HIGH (ref 65–99)

## 2015-06-07 SURGERY — EGD (ESOPHAGOGASTRODUODENOSCOPY)
Anesthesia: Moderate Sedation

## 2015-06-07 MED ORDER — ACETAMINOPHEN 500 MG PO TABS
500.0000 mg | ORAL_TABLET | Freq: Four times a day (QID) | ORAL | Status: DC | PRN
  Administered 2015-06-07 – 2015-06-11 (×4): 500 mg via ORAL
  Filled 2015-06-07 (×5): qty 1

## 2015-06-07 MED ORDER — LIDOCAINE VISCOUS 2 % MT SOLN
OROMUCOSAL | Status: DC | PRN
  Administered 2015-06-07: 1 via OROMUCOSAL

## 2015-06-07 MED ORDER — ACETAMINOPHEN 325 MG PO TABS: 325.0000 mg | ORAL_TABLET | Freq: Four times a day (QID) | ORAL | Status: DC | PRN

## 2015-06-07 MED ORDER — SODIUM CHLORIDE 0.9 % IV SOLN
INTRAVENOUS | Status: DC
  Administered 2015-06-07: 1000 mL via INTRAVENOUS

## 2015-06-07 MED ORDER — MIDAZOLAM HCL 5 MG/5ML IJ SOLN
INTRAMUSCULAR | Status: AC
  Filled 2015-06-07: qty 10

## 2015-06-07 MED ORDER — MIDAZOLAM HCL 5 MG/5ML IJ SOLN
INTRAMUSCULAR | Status: DC | PRN
  Administered 2015-06-07 (×2): 2 mg via INTRAVENOUS

## 2015-06-07 MED ORDER — EPINEPHRINE HCL 0.1 MG/ML IJ SOSY
PREFILLED_SYRINGE | INTRAMUSCULAR | Status: AC
  Filled 2015-06-07: qty 10

## 2015-06-07 MED ORDER — MEPERIDINE HCL 100 MG/ML IJ SOLN
INTRAMUSCULAR | Status: DC | PRN
  Administered 2015-06-07 (×2): 25 mg

## 2015-06-07 MED ORDER — LIDOCAINE VISCOUS 2 % MT SOLN
OROMUCOSAL | Status: AC
  Filled 2015-06-07: qty 15

## 2015-06-07 MED ORDER — CETYLPYRIDINIUM CHLORIDE 0.05 % MT LIQD
7.0000 mL | Freq: Two times a day (BID) | OROMUCOSAL | Status: DC
  Administered 2015-06-07 – 2015-06-10 (×8): 7 mL via OROMUCOSAL

## 2015-06-07 MED ORDER — MEPERIDINE HCL 100 MG/ML IJ SOLN
INTRAMUSCULAR | Status: AC
  Filled 2015-06-07: qty 2

## 2015-06-07 NOTE — Progress Notes (Signed)
PROGRESS NOTE  [name] ZOX:[id number] DOB: [birth year] DOA: 06/06/2015 PCP: [name], STEPHEN, [name]  HPI/Recap of past 4524 hours: 82 year old with history of diabetes, hypertension and chronic kidney disease admitted on 1/12 after having 1 day of hematemesis and dark black stool. The emergency room, patient's hemoglobin noted to be 7.8. In addition, his troponin was elevated at 2. Patient admitted to hospitalist service. Transfused 1 unit packed red blood cells. Cardiology and GI consulted  By following day, troponin peaked to as high as 2.4 and then has trended downward. Seen by cardiology who felt that this is more secondary to chronic renal disease and demand ischemia instead of non-STEMI. Seen by GI who plans to take patient for endoscopy later today. Patient himself only complains of a mild headache-chronic issue for him  Assessment/Plan: Active Problems:   Diabetes (HCC) CBGs under 200.      Essential hypertension: Antihypertensives on hold until bleeding resolved    CKD (chronic kidney disease) stage 4, GFR 15-29 ml/min (HCC): Stable      Suspected upper GI bleed with secondary hematemesis and melena: Patient normally on daily baby aspirin plus when necessary Aleve. Status post 1 unit packed with blood cell transfusion. EGD pending   GI bleed   Elevated troponin: Demand ischemia as well as falsely elevated in setting of chronic kidney disease    Code Status: Full code  Family Communication: Multiple family members at the bedside   Disposition Plan: likely here for several days    Consultants:  Cardiology  GI  Procedures:  Echo pending  EGD pending   Antibiotics:  None    Objective: BP 121/54 mmHg  Pulse 77  Temp(Src) 97.4 F (36.3 C) (Oral)  Resp 14  Ht 6' (1.829 m)  Wt 123.378 kg (272 lb)  BMI 36.88 kg/m2  SpO2 98%  Intake/Output Summary (Last 24 hours) at 06/07/15 1633 Last data filed at 06/07/15 0600  Gross per 24 hour  Intake   1850 ml    Output      0 ml  Net   1850 ml   Filed Weights   06/06/15 1939 06/06/15 2238  Weight: 104.327 kg (230 lb) 123.378 kg (272 lb)    Exam:   General:  Alert & oriented x 3, NAD   Cardiovascular: RRR S1S2, II/VI SEM   Respiratory: CTA bilaterally   Abdomen: Soft, distended, mild tenderness in lower quadrants   Musculoskeletal: No edema    Data Reviewed: Basic Metabolic Panel:  Recent Labs Lab 06/06/15 2035 06/07/15 0711  NA 142 142  K 4.8 5.0  CL 108 110  CO2 21* 22  GLUCOSE 180* 170*  BUN 99* 103*  CREATININE 3.41* 3.45*  CALCIUM 9.3 9.0   Liver Function Tests:  Recent Labs Lab 06/06/15 2035 06/07/15 0711  AST 23 21  ALT 13* 11*  ALKPHOS 46 43  BILITOT 0.6 0.7  PROT 6.7 6.5  ALBUMIN 3.1* 3.1*    Recent Labs Lab 06/06/15 2035  LIPASE 82*   No results for input(s): AMMONIA in the last 168 hours. CBC:  Recent Labs Lab 06/06/15 2035 06/07/15 0711  WBC 12.6* 10.5  NEUTROABS 9.4*  --   HGB 7.8* 9.2*  HCT 23.8* 27.7*  MCV 93.0 92.3  PLT 236 191   Cardiac Enzymes:    Recent Labs Lab 06/06/15 2035 06/06/15 2210 06/07/15 0711 06/07/15 1154  TROPONINI 2.14* 2.03* 2.26* 1.82*   BNP (last 3 results) No results for input(s): BNP in the last  8760 hours.  ProBNP (last 3 results) No results for input(s): PROBNP in the last 8760 hours.  CBG:  Recent Labs Lab 06/06/15 2238 06/07/15 0833 06/07/15 1143  GLUCAP 172* 132* 142*    No results found for this or any previous visit (from the past 240 hour(s)).   Studies: Dg Chest Port 1 View  06/06/2015  CLINICAL DATA:  Nausea, vomiting and weakness. Intermittent shortness of breath. EXAM: PORTABLE CHEST 1 VIEW COMPARISON:  05/22/2015 FINDINGS: Patient is post median sternotomy. Cardiomediastinal contours are unchanged allowing for differences in technique with borderline cardiomegaly and tortuous atherosclerotic thoracic aorta. No pulmonary edema, pleural effusion or pneumothorax. No confluent  airspace disease. Osseous structures are unchanged. IMPRESSION: No acute pulmonary process. Electronically Signed   By: Rubye Oaks M.D.   On: 06/06/2015 21:20    Scheduled Meds: . [MAR Hold] antiseptic oral rinse  7 mL Mouth Rinse BID  . EPINEPHrine      . [MAR Hold] insulin aspart  0-9 Units Subcutaneous TID WC  . lidocaine      . meperidine      . midazolam        Continuous Infusions: . sodium chloride 100 mL/hr at 06/06/15 2137  . sodium chloride 1,000 mL (06/07/15 1503)  . pantoprozole (PROTONIX) infusion 8 mg/hr (06/07/15 0010)     Time spent: 15 minutes   [name]  [facility] Pager [phone] . If 7PM-7AM, please contact night-coverage at [email or web address], password [facility] 06/07/2015, 4:33 PM  LOS: 1 day

## 2015-06-07 NOTE — Progress Notes (Signed)
CRITICAL VALUE ALERT  Critical value received:  Triponin 1.82 Date of notification:06/07/2015  Time of notification:  1250  Critical value read back:Yes.    Nurse who received alert:  Holly BodilyMichelle DeLancey, RN  MD notified (1st page):  Dr. Rito EhrlichKrishnan  Time of first page:  1251 MD notified (2nd page):  Time of second page:  Responding MD:  Dr. Rito EhrlichKrishnan Came into the patient's room at 1253  Time MD responded:  Dr. Rito EhrlichKrishnan

## 2015-06-07 NOTE — Consult Note (Signed)
CARDIOLOGY CONSULT NOTE  Patient ID: [name] MRN: [id number] DOB/AGE: 1933/08/21 82 y.o.  Admit date: 06/06/2015 Primary Physician Joycelyn RuaMEYERS, STEPHEN, MD  Reason for Consultation: Elevated troponin, GI bleed  HPI: The patient is a 82 yr old male admitted with an upper GI bleed, Hgb initially 7.8., now up to 9.2 after PRBC transfusion. He has been having black stool diarrhea for roughly two days and coffee ground emesis as well. He has a h/o CABG in June 2003 and follows with Dr. Antoine PocheHochrein. Underwent a normal dobutamine stress echocardiogram in 06/2012. Takes ASA and Lipitor, ASA currently being held. Troponins elevated: 2.14-->2.03-->2.26. Being given IV fluids due to acute on chronic renal insufficiency and hypovolemic state. ECG shows sinus rhythm with PVC and a mild nonspecific T wave abnormality in high lateral leads.  Denies chest pain. Has chronic exertional dyspnea which is no worse over the past year. Says he has lower epigastric pain (points to infraumbilical region). Has chronic dizziness. Denies syncope.   No Known Allergies  Current Facility-Administered Medications  Medication Dose Route Frequency Provider Last Rate Last Dose  . 0.9 %  sodium chloride infusion   Intravenous Continuous Samuel JesterKathleen McManus, DO 100 mL/hr at 06/06/15 2137    . antiseptic oral rinse (CPC / CETYLPYRIDINIUM CHLORIDE 0.05%) solution 7 mL  7 mL Mouth Rinse BID Nimish C Gosrani, MD   7 mL at 06/07/15 0330  . insulin aspart (novoLOG) injection 0-9 Units  0-9 Units Subcutaneous TID WC Nimish C Gosrani, MD      . ondansetron (ZOFRAN) tablet 4 mg  4 mg Oral Q6H PRN Nimish Normajean Glasgow Gosrani, MD       Or  . ondansetron (ZOFRAN) injection 4 mg  4 mg Intravenous Q6H PRN Nimish C Gosrani, MD      . pantoprazole (PROTONIX) 80 mg in sodium chloride 0.9 % 250 mL (0.32 mg/mL) infusion  8 mg/hr Intravenous Continuous Samuel JesterKathleen McManus, DO 25 mL/hr at 06/07/15 0010 8 mg/hr at 06/07/15 0010    Past Medical  History  Diagnosis Date  . Hypertension   . Hyperlipidemia   . Diabetes mellitus, type II, insulin dependent (HCC)   . Coronary atherosclerosis of native coronary artery 2003    Multivessel s/p CABG  . Morbid obesity (HCC)   . GERD (gastroesophageal reflux disease)   . History of tobacco abuse   . Back pain   . Hard of hearing   . Aortic root dilatation (HCC)   . CKD (chronic kidney disease) stage 4, GFR 15-29 ml/min [facility](HCC)     Past Surgical History  Procedure Laterality Date  . Appendectomy    . Lumbar disc surgery      x2  . Coronary artery bypass graft  June 2003    LIMA to LAD, SVG to diagonal, sequential SVG to obtuse marginal and circumf SVG to RCAl  . Angioplasty    . Colonoscopy w/ polypectomy    . Skin cancer excision    . Hip arthroplasty Left 07/25/2013    Procedure: ARTHROPLASTY BIPOLAR HIP;  Surgeon: Darreld McleanWayne Keeling, MD;  Location: [facility];  Service: Orthopedics;  Laterality: Left;    Social History   Social History  . Marital Status: Married    Spouse Name: N/A  . Number of Children: N/A  . Years of Education: N/A   Occupational History  . Retired from [employer]    Social History Main Topics  . Smoking status: Former Smoker -- 1.00 packs/day for  60 years    Types: Cigarettes    Quit date: 05/25/2000  . Smokeless tobacco: Not on file  . Alcohol Use: No  . Drug Use: No  . Sexual Activity: Not on file   Other Topics Concern  . Not on file   Social History Narrative   Lives with wife of 52 years   Enjoys watching his great-grandson     No family history of premature CAD in 1st degree relatives.  Prior to Admission medications   Medication Sig Start Date End Date Taking? Authorizing Provider  albuterol (PROVENTIL HFA;VENTOLIN HFA) 108 (90 BASE) MCG/ACT inhaler Inhale 2 puffs into the lungs every 4 (four) hours as needed for wheezing or shortness of breath.   Yes Historical Provider, MD  aspirin 81 MG tablet Take 81 mg by mouth daily.   Yes  Historical Provider, MD  atorvastatin (LIPITOR) 20 MG tablet TAKE 1 TABLET BY MOUTH AT BEDTIME FOR CHOLESTEROL. 02/15/14  Yes Rollene Rotunda, MD  citalopram (CELEXA) 40 MG tablet Take 40 mg by mouth daily.   Yes Historical Provider, MD  furosemide (LASIX) 20 MG tablet Take 40 mg by mouth daily.    Yes Historical Provider, MD  gabapentin (NEURONTIN) 100 MG capsule Take 100 mg by mouth 3 (three) times daily.   Yes Historical Provider, MD  HYDROcodone-acetaminophen (NORCO/VICODIN) 5-325 MG per tablet Take 1-2 tablets by mouth every 6 (six) hours as needed for moderate pain. 07/30/13  Yes Standley Brooking, MD  insulin detemir (LEVEMIR) 100 UNIT/ML injection Inject 0.25 mLs (25 Units total) into the skin daily. Patient taking differently: Inject 25 Units into the skin daily as needed (for high sugar levels).  07/30/13  Yes Standley Brooking, MD  naproxen (NAPROSYN) 500 MG tablet 500 mg 2 (two) times daily with a meal.  03/19/14  Yes Historical Provider, MD  pioglitazone (ACTOS) 45 MG tablet Take 45 mg by mouth daily.  05/22/15  Yes Historical Provider, MD  potassium chloride (MICRO-K) 10 MEQ CR capsule Take 10 mEq by mouth daily.   Yes Historical Provider, MD  ranitidine (ZANTAC) 300 MG tablet Take 300 mg by mouth at bedtime.  03/07/14  Yes Historical Provider, MD  SPIRIVA RESPIMAT 2.5 MCG/ACT AERS Inhale 1 puff into the lungs daily.  03/07/14  Yes Historical Provider, MD  cefdinir (OMNICEF) 300 MG capsule  05/22/15   Historical Provider, MD     Review of systems complete and found to be negative unless listed above in HPI     Physical exam Blood pressure 174/77, pulse 83, temperature 100 F (37.8 C), temperature source Axillary, resp. rate 20, height 6' (1.829 m), weight 272 lb (123.378 kg), SpO2 95 %. General: NAD Neck: No JVD, no thyromegaly or thyroid nodule.  Lungs: Faint expiratory wheezes, no rales. CV: Nondisplaced PMI. Regular rate and rhythm, normal S1/S2, no S3/S4, no murmur.  No  peripheral edema.    Abdomen: Soft, obese, mild lower abdominal tenderness, no rebound or guarding.  Skin: Intact without lesions or rashes.  Neurologic: Alert.  Psych: Flat affect. Extremities: No clubbing or cyanosis.  HEENT: Normal.   ECG: Most recent ECG reviewed.  Labs:   Lab Results  Component Value Date   WBC 10.5 06/07/2015   HGB 9.2* 06/07/2015   HCT 27.7* 06/07/2015   MCV 92.3 06/07/2015   PLT 191 06/07/2015    Recent Labs Lab 06/07/15 0711  NA 142  K 5.0  CL 110  CO2 22  BUN 103*  CREATININE  3.45*  CALCIUM 9.0  PROT 6.5  BILITOT 0.7  ALKPHOS 43  ALT 11*  AST 21  GLUCOSE 170*   Lab Results  Component Value Date   TROPONINI 2.26* 06/07/2015    Lab Results  Component Value Date   CHOL 174 12/10/2010   Lab Results  Component Value Date   HDL 50.80 12/10/2010   Lab Results  Component Value Date   LDLCALC 108* 12/10/2010   Lab Results  Component Value Date   TRIG 77.0 12/10/2010   Lab Results  Component Value Date   CHOLHDL 3 12/10/2010   No results found for: LDLDIRECT       Studies: Dg Chest Port 1 View  06/06/2015  CLINICAL DATA:  Nausea, vomiting and weakness. Intermittent shortness of breath. EXAM: PORTABLE CHEST 1 VIEW COMPARISON:  05/22/2015 FINDINGS: Patient is post median sternotomy. Cardiomediastinal contours are unchanged allowing for differences in technique with borderline cardiomegaly and tortuous atherosclerotic thoracic aorta. No pulmonary edema, pleural effusion or pneumothorax. No confluent airspace disease. Osseous structures are unchanged. IMPRESSION: No acute pulmonary process. Electronically Signed   By: Rubye Oaks M.D.   On: 06/06/2015 21:20    ASSESSMENT AND PLAN:  1. Troponin elevation in context of upper GI bleed and h/o CABG: Symptomatically stable from a cardiovascular standpoint, with no acute ischemic ST-T abnormalities (mild nonspecific T wave abnormality in high lateral leads, diagonal branch region).  Appears to be consistent with demand ischemia rather than true NSTEMI. ASA on hold. Would resume Lipitor when feasible.  Normal stress test in 06/2012. Will obtain echocardiogram to assess LV systolic function and regional wall motion.  2. Essential HTN: Elevated after PRBC transfusion and IV fluids. Had been hypovolemic. Will not adjust meds at this time.  3. Acute on chronic renal insufficiency: Hypovolemic, receiving IV fluids and now s/p PRBC transfusion.  4. Upper GI bleed: Will need endoscopy. GI following.   Signed: Prentice Docker, M.D., F.A.C.C.  06/07/2015, 9:47 AM

## 2015-06-07 NOTE — Progress Notes (Signed)
CRITICAL VALUE ALERT  Critical value received:  Triponin 2.26  Date of notification:  06/07/2015  Time of notification:  0800  Critical value read back:Yes.   Yes  Nurse who received alert Holly BodilyMichelle DeLancey, RN  MD notified (1st page):  Dr. Rito EhrlichKrishnan  Time of first page:  0801  MD notified (2nd page):  Time of second page:  Responding MD:  Rito EhrlichKrishnan  Time MD responded:  [phone]

## 2015-06-07 NOTE — Consult Note (Signed)
Referring Provider: No ref. provider found Primary Care Physician:  Joycelyn Rua, MD Primary Gastroenterologist:  Dr. Darrick Penna  Date of Admission: 06/06/15 Date of Consultation: 06/07/15  Reason for Consultation:  GI bleed  HPI:  82 year old amle with a PMH of htn, hyperlipidemia, DM, CAD, GERD, CKD presented to the ED with blacck stool diarrhea along with coffee-ground/black hematemesis. Denied hematochezia, abdominal pain, fever in the ER. Did complain of lightheadedness. Stool heme+ in the ER, hgb found to be 7.8. Heme + stool in the ER. Admitted for further evaluation. Has received 2 units PRBC. Repeat hgb this morning after transfusion improved appropriately to 9.2. BUN/Cr this morning 103/3.45 which is elevated from his baseline average of 54/2.4. Has also had positive troponins of 2.14, 2.03, and 2.26. Cardiology has been consulted.  No colonoscopies or endoscopes found in the system although colonoscopy is listed in his surgical history.   Today he is assisted in his history by his family because he is hard of hearing. Per his wife, he began having black stools about 2 days ago. Has had multiple black stools. Last bowel movement was this morning "they had to clean the floor" but staff is unsure of if that happened and/or if it was still melanous. Also withmultiple episodes of emesis over the past 1-2 days. Described as black emesis consistent with hematemesis. Last episode of emesis was last night in the ER. Admits some lower abdominal pain. Denies GERD symptoms, other abdominal pain, chest pain, worsening dyspnea. Has chronic dizziness no worse then normal at this time. Denies syncope or near syncope. Does not drink ETOH. Takes Naproxen for pain.  Past Medical History  Diagnosis Date  . Hypertension   . Hyperlipidemia   . Diabetes mellitus, type II, insulin dependent (HCC)   . Coronary atherosclerosis of native coronary artery 2003    Multivessel s/p CABG  . Morbid obesity (HCC)   .  GERD (gastroesophageal reflux disease)   . History of tobacco abuse   . Back pain   . Hard of hearing   . Aortic root dilatation (HCC)   . CKD (chronic kidney disease) stage 4, GFR 15-29 ml/min [facility])     Past Surgical History  Procedure Laterality Date  . Appendectomy    . Lumbar disc surgery      x2  . Coronary artery bypass graft  June 2003    LIMA to LAD, SVG to diagonal, sequential SVG to obtuse marginal and circumf SVG to RCAl  . Angioplasty    . Colonoscopy w/ polypectomy    . Skin cancer excision    . Hip arthroplasty Left 07/25/2013    Procedure: ARTHROPLASTY BIPOLAR HIP;  Surgeon: Darreld Mclean, MD;  Location: [facility];  Service: Orthopedics;  Laterality: Left;    Prior to Admission medications   Medication Sig Start Date End Date Taking? Authorizing Provider  albuterol (PROVENTIL HFA;VENTOLIN HFA) 108 (90 BASE) MCG/ACT inhaler Inhale 2 puffs into the lungs every 4 (four) hours as needed for wheezing or shortness of breath.   Yes Historical Provider, MD  aspirin 81 MG tablet Take 81 mg by mouth daily.   Yes Historical Provider, MD  atorvastatin (LIPITOR) 20 MG tablet TAKE 1 TABLET BY MOUTH AT BEDTIME FOR CHOLESTEROL. 02/15/14  Yes Rollene Rotunda, MD  citalopram (CELEXA) 40 MG tablet Take 40 mg by mouth daily.   Yes Historical Provider, MD  furosemide (LASIX) 20 MG tablet Take 40 mg by mouth daily.    Yes Historical Provider, MD  gabapentin (NEURONTIN) 100 MG capsule Take 100 mg by mouth 3 (three) times daily.   Yes Historical Provider, MD  HYDROcodone-acetaminophen (NORCO/VICODIN) 5-325 MG per tablet Take 1-2 tablets by mouth every 6 (six) hours as needed for moderate pain. 07/30/13  Yes Standley Brookinganiel P Goodrich, MD  insulin detemir (LEVEMIR) 100 UNIT/ML injection Inject 0.25 mLs (25 Units total) into the skin daily. Patient taking differently: Inject 25 Units into the skin daily as needed (for high sugar levels).  07/30/13  Yes Standley Brookinganiel P Goodrich, MD  naproxen (NAPROSYN) 500 MG tablet 500  mg 2 (two) times daily with a meal.  03/19/14  Yes Historical Provider, MD  pioglitazone (ACTOS) 45 MG tablet Take 45 mg by mouth daily.  05/22/15  Yes Historical Provider, MD  potassium chloride (MICRO-K) 10 MEQ CR capsule Take 10 mEq by mouth daily.   Yes Historical Provider, MD  ranitidine (ZANTAC) 300 MG tablet Take 300 mg by mouth at bedtime.  03/07/14  Yes Historical Provider, MD  SPIRIVA RESPIMAT 2.5 MCG/ACT AERS Inhale 1 puff into the lungs daily.  03/07/14  Yes Historical Provider, MD  cefdinir (OMNICEF) 300 MG capsule  05/22/15   Historical Provider, MD    Current Facility-Administered Medications  Medication Dose Route Frequency Provider Last Rate Last Dose  . 0.9 %  sodium chloride infusion   Intravenous Continuous Samuel JesterKathleen McManus, DO 100 mL/hr at 06/06/15 2137    . antiseptic oral rinse (CPC / CETYLPYRIDINIUM CHLORIDE 0.05%) solution 7 mL  7 mL Mouth Rinse BID Nimish C Gosrani, MD   7 mL at 06/07/15 0330  . insulin aspart (novoLOG) injection 0-9 Units  0-9 Units Subcutaneous TID WC Nimish C Gosrani, MD      . ondansetron (ZOFRAN) tablet 4 mg  4 mg Oral Q6H PRN Nimish Normajean Glasgow Gosrani, MD       Or  . ondansetron (ZOFRAN) injection 4 mg  4 mg Intravenous Q6H PRN Nimish C Gosrani, MD      . pantoprazole (PROTONIX) 80 mg in sodium chloride 0.9 % 250 mL (0.32 mg/mL) infusion  8 mg/hr Intravenous Continuous Samuel JesterKathleen McManus, DO 25 mL/hr at 06/07/15 0010 8 mg/hr at 06/07/15 0010    Allergies as of 06/06/2015  . (No Known Allergies)    Family History  Problem Relation Age of Onset  . Heart disease Father   . Heart attack Child     Social History   Social History  . Marital Status: Married    Spouse Name: N/A  . Number of Children: N/A  . Years of Education: N/A   Occupational History  . Retired from [employer]    Social History Main Topics  . Smoking status: Former Smoker -- 1.00 packs/day for 60 years    Types: Cigarettes    Quit date: 05/25/2000  . Smokeless tobacco:  Not on file  . Alcohol Use: No  . Drug Use: No  . Sexual Activity: Not on file   Other Topics Concern  . Not on file   Social History Narrative   Lives with wife of 52 years   Enjoys watching his great-grandson    Review of Systems: Gen: Denies fever, chills, loss of appetite, change in weight or weight loss CV: Denies chest pain, heart palpitations, syncope, edema  Resp: Denies shortness of breath with rest, cough, wheezing GI: See HPI.  MS: Denies joint pain, swelling, cramping Psych: Denies confusion or memory loss Heme: Denies excessive bruising.  Physical Exam: Vital signs in last 24 hours:  Temp:  [97.4 F (36.3 C)-100 F (37.8 C)] 100 F (37.8 C) (01/13 0529) Pulse Rate:  [74-100] 83 (01/13 0529) Resp:  [16-23] 20 (01/13 0529) BP: (111-174)/(42-77) 174/77 mmHg (01/13 0529) SpO2:  [95 %-100 %] 95 % (01/13 0529) Weight:  [230 lb (104.327 kg)-272 lb (123.378 kg)] 272 lb (123.378 kg) (01/12 2238) Last BM Date: 06/06/15 General:   Alert,  Well-developed, well-nourished, pleasant and cooperative in NAD Head:  Normocephalic and atraumatic. Eyes:  Sclera clear, no icterus. Conjunctiva pink. Ears:  Normal auditory acuity. Neck:  Supple; no masses or thyromegaly. Lungs:  Clear throughout to auscultation. No wheezes, crackles, or rhonchi. No acute distress. Heart:  Regular rate and rhythm; no murmurs, clicks, rubs,  or gallops. Abdomen:  Rounded but soft, nontender and nondistended. Normal bowel sounds, without guarding, and without rebound.   Rectal:  Deferred.   Pulses:  Normal DP pulses noted. Extremities:  Without clubbing or edema. Neurologic:  Alert and  oriented x4;  grossly normal neurologically. Skin:  Intact without significant lesions or rashes. Psych:  Alert and cooperative. Normal mood and affect.  Intake/Output from previous day: 01/12 0701 - 01/13 0700 In: 1850 [I.V.:530; Blood:1320] Out: -  Intake/Output this shift:    Lab Results:  Recent Labs   06/06/15 2035 06/07/15 0711  WBC 12.6* 10.5  HGB 7.8* 9.2*  HCT 23.8* 27.7*  PLT 236 191   BMET  Recent Labs  06/06/15 2035 06/07/15 0711  NA 142 142  K 4.8 5.0  CL 108 110  CO2 21* 22  GLUCOSE 180* 170*  BUN 99* 103*  CREATININE 3.41* 3.45*  CALCIUM 9.3 9.0   LFT  Recent Labs  06/06/15 2035 06/07/15 0711  PROT 6.7 6.5  ALBUMIN 3.1* 3.1*  AST 23 21  ALT 13* 11*  ALKPHOS 46 43  BILITOT 0.6 0.7   PT/INR  Recent Labs  06/06/15 2035  LABPROT 15.9*  INR 1.25   Hepatitis Panel No results for input(s): HEPBSAG, HCVAB, HEPAIGM, HEPBIGM in the last 72 hours. C-Diff No results for input(s): CDIFFTOX in the last 72 hours.  Studies/Results: Dg Chest Port 1 View  06/06/2015  CLINICAL DATA:  Nausea, vomiting and weakness. Intermittent shortness of breath. EXAM: PORTABLE CHEST 1 VIEW COMPARISON:  05/22/2015 FINDINGS: Patient is post median sternotomy. Cardiomediastinal contours are unchanged allowing for differences in technique with borderline cardiomegaly and tortuous atherosclerotic thoracic aorta. No pulmonary edema, pleural effusion or pneumothorax. No confluent airspace disease. Osseous structures are unchanged. IMPRESSION: No acute pulmonary process. Electronically Signed   By: Rubye Oaks M.D.   On: 06/06/2015 21:20    Impression: 82 year old male admitted with likely upper GI bleed with 2 days of melena stools and hematemesis. Heme+ stools in the ER. Significant history of CAD and CABG not currently on anticoagulants. Last melena stool this morning per the patient's wife. Last emesis last ngiht in the ER. Significant drop in Hgb to 7.8 with rebound to 9.2 after PRBC x 2. Positive troponins x 3 query demand ischemia from blood loss but concerning given cardiac history. Cardiology has been consulted.  Likely upper GI bleed query NSAID gastritis, esophagitis, mallory weiss tear, gastric ulcer.  Plan: 1. Continue IV PPI 2. Remain NPO 3. Request cardiology  clearance for conscious sedation 4. Will add on the endoscopy schedule for EGD 5. Remain NPO 6. Continue antiemetics to prevent nausea and further vomiting 7. Monitor for recurrent GI bleed; Monitor H/H: will recheck this evening   Wynne Dust,  AGNP-C Adult & Gerontological Nurse Practitioner [facility]    LOS: 1 day     06/07/2015, 9:21 AM

## 2015-06-07 NOTE — Op Note (Signed)
[facility] [address] [place], [id number]   ENDOSCOPY PROCEDURE REPORT  PATIENT: Corey BertholdSouthern, [place] B  MR#: [id number] BIRTHDATE: 1934/04/29 , 81  yrs. old GENDER: male  ENDOSCOPIST: West BaliSandi L Asir Bingley, MD REFERRED XB:[id number]:Stephen Lenise ArenaMeyers, M.D.  PROCEDURE DATE: 06/07/2015 PROCEDURE:   EGD w/ biopsy and EGD w/ control of bleeding , SUBMUCOSAL INJECTION(EPINEPHRINE)  INDICATIONS:melena.   hematemesis. MEDICATIONS: Demerol 50 mg IV and Versed 4 mg IV  MD INITAITED SEDATION: 1556. ENDOSCOPY COMPLETE: 1620 TOPICAL ANESTHETIC:   Viscous Xylocaine  ASA CLASS:  DESCRIPTION OF PROCEDURE:     Physical exam was performed.  Informed consent was obtained from the patient after explaining the benefits, risks, and alternatives to the procedure.  The patient was connected to the monitor and placed in the left lateral position.  Continuous oxygen was provided by nasal cannula and IV medicine administered through an indwelling cannula.  After administration of sedation, the patients esophagus was intubated and the [id number] ([id number]  endoscope was advanced under direct visualization to the second portion of the duodenum.  The scope was removed slowly by carefully examining the color, texture, anatomy, and integrity of the mucosa on the way out.  The patient was recovered in endoscopy and discharged home in satisfactory condition.  Estimated blood loss is zero unless otherwise noted in this procedure report.     ESOPHAGUS: The mucosa of the esophagus appeared normal.   STOMACH: Severe nodular gastritis (inflammation) was found in the entire examined stomach.  Multiple biopsies were performed using cold forceps.   DUODENUM: Mild duodenal inflammation was found in the duodenal bulb.   The duodenal mucosa showed no abnormalities in the 2nd part of the duodenum. COMPLICATIONS: There were no immediate complications.  ENDOSCOPIC IMPRESSION: 1.   UGIB BLEED DUE TO LARGE ANTRAL ULCER 2.    SEVERE Nodular gastritis AND MILD DUODENITIS  RECOMMENDATIONS: ADVANCE DIET PROTONIX GTT FOR 72 HRS AWAIT BIOPSY RESULTS REPEAT EGD IN 3 MOS TO ASSESS HEALING  REPEAT EXAM: eSigned:  West BaliSandi L Rudolph Daoust, MD 06/07/2015 4:32 PM     CPT CODES: ICD CODES:  The ICD and CPT codes recommended by this software are interpretations from the data that the clinical staff has captured with the software.  The verification of the translation of this report to the ICD and CPT codes and modifiers is the sole responsibility of the health care institution and practicing physician where this report was generated.  [employer] will not be held responsible for the validity of the ICD and CPT codes included on this report.  AMA assumes no liability for data contained or not contained herein. CPT is a Publishing rights managerregistered trademark of the [employer].

## 2015-06-07 NOTE — H&P (Signed)
Primary Care Physician:  Joycelyn Rua, MD Primary Gastroenterologist:  Dr. Darrick Penna  Pre-Procedure History & Physical: HPI:  [name] is a 82 y.o. male here for MELENA/HEMATEMESIS.  Past Medical History  Diagnosis Date  . Hypertension   . Hyperlipidemia   . Diabetes mellitus, type II, insulin dependent (HCC)   . Coronary atherosclerosis of native coronary artery 2003    Multivessel s/p CABG  . Morbid obesity (HCC)   . GERD (gastroesophageal reflux disease)   . History of tobacco abuse   . Back pain   . Hard of hearing   . Aortic root dilatation (HCC)   . CKD (chronic kidney disease) stage 4, GFR 15-29 ml/min [facility])     Past Surgical History  Procedure Laterality Date  . Appendectomy    . Lumbar disc surgery      x2  . Coronary artery bypass graft  June 2003    LIMA to LAD, SVG to diagonal, sequential SVG to obtuse marginal and circumf SVG to RCAl  . Angioplasty    . Colonoscopy w/ polypectomy    . Skin cancer excision    . Hip arthroplasty Left 07/25/2013    Procedure: ARTHROPLASTY BIPOLAR HIP;  Surgeon: Darreld Mclean, MD;  Location: [facility];  Service: Orthopedics;  Laterality: Left;    Prior to Admission medications   Medication Sig Start Date End Date Taking? Authorizing Provider  albuterol (PROVENTIL HFA;VENTOLIN HFA) 108 (90 BASE) MCG/ACT inhaler Inhale 2 puffs into the lungs every 4 (four) hours as needed for wheezing or shortness of breath.   Yes Historical Provider, MD  aspirin 81 MG tablet Take 81 mg by mouth daily.   Yes Historical Provider, MD  atorvastatin (LIPITOR) 20 MG tablet TAKE 1 TABLET BY MOUTH AT BEDTIME FOR CHOLESTEROL. 02/15/14  Yes Rollene Rotunda, MD  citalopram (CELEXA) 40 MG tablet Take 40 mg by mouth daily.   Yes Historical Provider, MD  furosemide (LASIX) 20 MG tablet Take 40 mg by mouth daily.    Yes Historical Provider, MD  gabapentin (NEURONTIN) 100 MG capsule Take 100 mg by mouth 3 (three) times daily.   Yes Historical Provider, MD   HYDROcodone-acetaminophen (NORCO/VICODIN) 5-325 MG per tablet Take 1-2 tablets by mouth every 6 (six) hours as needed for moderate pain. 07/30/13  Yes Standley Brooking, MD  insulin detemir (LEVEMIR) 100 UNIT/ML injection Inject 0.25 mLs (25 Units total) into the skin daily. Patient taking differently: Inject 25 Units into the skin daily as needed (for high sugar levels).  07/30/13  Yes Standley Brooking, MD  naproxen (NAPROSYN) 500 MG tablet 500 mg 2 (two) times daily with a meal.  03/19/14  Yes Historical Provider, MD  pioglitazone (ACTOS) 45 MG tablet Take 45 mg by mouth daily.  05/22/15  Yes Historical Provider, MD  potassium chloride (MICRO-K) 10 MEQ CR capsule Take 10 mEq by mouth daily.   Yes Historical Provider, MD  ranitidine (ZANTAC) 300 MG tablet Take 300 mg by mouth at bedtime.  03/07/14  Yes Historical Provider, MD  SPIRIVA RESPIMAT 2.5 MCG/ACT AERS Inhale 1 puff into the lungs daily.  03/07/14  Yes Historical Provider, MD  cefdinir (OMNICEF) 300 MG capsule  05/22/15   Historical Provider, MD    Allergies as of 06/06/2015  . (No Known Allergies)    Family History  Problem Relation Age of Onset  . Heart disease Father   . Heart attack Child     Social History   Social History  .  Marital Status: Married    Spouse Name: N/A  . Number of Children: N/A  . Years of Education: N/A   Occupational History  . Retired from [employer]    Social History Main Topics  . Smoking status: Former Smoker -- 1.00 packs/day for 60 years    Types: Cigarettes    Quit date: 05/25/2000  . Smokeless tobacco: Not on file  . Alcohol Use: No  . Drug Use: No  . Sexual Activity: Not on file   Other Topics Concern  . Not on file   Social History Narrative   Lives with wife of 52 years   Enjoys watching his great-grandson    Review of Systems: See HPI, otherwise negative ROS   Physical Exam: BP 130/62 mmHg  Pulse 82  Temp(Src) 97.4 F (36.3 C) (Oral)  Resp 17  Ht 6' (1.829 m)   Wt 272 lb (123.378 kg)  BMI 36.88 kg/m2  SpO2 98% General:   Alert,  pleasant and cooperative in NAD Head:  Normocephalic and atraumatic. Neck:  Supple; Lungs:  Clear throughout to auscultation.    Heart:  Regular rate and rhythm. Abdomen:  Soft, nontender and nondistended. Normal bowel sounds, without guarding, and without rebound.   Neurologic:  Alert and  oriented x4;  grossly normal neurologically.  Impression/Plan:    MELENA/HEMATEMESIS  PLAN: 1. EGD TODAY

## 2015-06-07 NOTE — Care Management Important Message (Signed)
Important Message  Patient Details  Name: [name] MRN: [id number] Date of Birth: 1934/01/23   Medicare Important Message Given:  Yes    Malcolm MetroChildress, [place] Kimberlin Demske, RN 06/07/2015, 2:55 PM

## 2015-06-08 ENCOUNTER — Inpatient Hospital Stay (HOSPITAL_COMMUNITY): Payer: Medicare Other

## 2015-06-08 DIAGNOSIS — N289 Disorder of kidney and ureter, unspecified: Secondary | ICD-10-CM

## 2015-06-08 DIAGNOSIS — D62 Acute posthemorrhagic anemia: Secondary | ICD-10-CM | POA: Insufficient documentation

## 2015-06-08 DIAGNOSIS — R7989 Other specified abnormal findings of blood chemistry: Secondary | ICD-10-CM

## 2015-06-08 DIAGNOSIS — K922 Gastrointestinal hemorrhage, unspecified: Secondary | ICD-10-CM | POA: Insufficient documentation

## 2015-06-08 DIAGNOSIS — I10 Essential (primary) hypertension: Secondary | ICD-10-CM

## 2015-06-08 LAB — TYPE AND SCREEN
ABO/RH(D): O POS
ANTIBODY SCREEN: NEGATIVE
UNIT DIVISION: 0
Unit division: 0

## 2015-06-08 LAB — BASIC METABOLIC PANEL
Anion gap: 7 (ref 5–15)
BUN: 75 mg/dL — AB (ref 6–20)
CO2: 21 mmol/L — ABNORMAL LOW (ref 22–32)
CREATININE: 2.76 mg/dL — AB (ref 0.61–1.24)
Calcium: 8.9 mg/dL (ref 8.9–10.3)
Chloride: 116 mmol/L — ABNORMAL HIGH (ref 101–111)
GFR calc Af Amer: 24 mL/min — ABNORMAL LOW (ref >60)
GFR, EST NON AFRICAN AMERICAN: 20 mL/min — AB (ref >60)
Glucose, Bld: 152 mg/dL — ABNORMAL HIGH (ref 65–99)
Potassium: 5.3 mmol/L — ABNORMAL HIGH (ref 3.5–5.1)
SODIUM: 144 mmol/L (ref 135–145)

## 2015-06-08 LAB — CBC
HCT: 26.2 % — ABNORMAL LOW (ref 39.0–52.0)
Hemoglobin: 8.5 g/dL — ABNORMAL LOW (ref 13.0–17.0)
MCH: 30.8 pg (ref 26.0–34.0)
MCHC: 32.4 g/dL (ref 30.0–36.0)
MCV: 94.9 fL (ref 78.0–100.0)
PLATELETS: 162 10*3/uL (ref 150–400)
RBC: 2.76 MIL/uL — AB (ref 4.22–5.81)
RDW: 15.3 % (ref 11.5–15.5)
WBC: 7.4 10*3/uL (ref 4.0–10.5)

## 2015-06-08 LAB — URINALYSIS, ROUTINE W REFLEX MICROSCOPIC
BILIRUBIN URINE: NEGATIVE
Glucose, UA: 250 mg/dL — AB
Hgb urine dipstick: NEGATIVE
Ketones, ur: NEGATIVE mg/dL
LEUKOCYTES UA: NEGATIVE
NITRITE: NEGATIVE
PH: 5.5 (ref 5.0–8.0)
PROTEIN: NEGATIVE mg/dL
Specific Gravity, Urine: 1.01 (ref 1.005–1.030)

## 2015-06-08 LAB — TROPONIN I: TROPONIN I: 0.79 ng/mL — AB (ref <0.031)

## 2015-06-08 LAB — GLUCOSE, CAPILLARY
GLUCOSE-CAPILLARY: 103 mg/dL — AB (ref 65–99)
GLUCOSE-CAPILLARY: 121 mg/dL — AB (ref 65–99)
GLUCOSE-CAPILLARY: 178 mg/dL — AB (ref 65–99)
GLUCOSE-CAPILLARY: 133 mg/dL — AB (ref 65–99)
Glucose-Capillary: 133 mg/dL — ABNORMAL HIGH (ref 65–99)

## 2015-06-08 MED ORDER — INSULIN ASPART 100 UNIT/ML ~~LOC~~ SOLN
0.0000 [IU] | Freq: Three times a day (TID) | SUBCUTANEOUS | Status: DC
  Administered 2015-06-08: 2 [IU] via SUBCUTANEOUS
  Administered 2015-06-09: 1 [IU] via SUBCUTANEOUS
  Administered 2015-06-09: 2 [IU] via SUBCUTANEOUS
  Administered 2015-06-09: 1 [IU] via SUBCUTANEOUS
  Administered 2015-06-10 (×3): 2 [IU] via SUBCUTANEOUS

## 2015-06-08 MED ORDER — INSULIN ASPART 100 UNIT/ML ~~LOC~~ SOLN: 0.0000 [IU] | Freq: Three times a day (TID) | SUBCUTANEOUS | Status: DC

## 2015-06-08 NOTE — Progress Notes (Signed)
Patient ID: [name], male   DOB: 1934-05-08, 82 y.o.   MRN: [id number]   Assessment/Plan: ADMITTED WITH UGI BLEED DUE TO LARGE GASTRIC ULCER. CLINICALLY IMPROVED. NO BRBPR OR MELENA. Hb STABLE  PLAN:  1. PROTONIX GTT UNTIL MON JAN 16 AT 4 PM 2. CONTINUE TO MONITOR SYMPTOMS. 3. SUPPORTIVE CARE   Subjective: Since I last evaluated the patient HE IS TOLERATING POS. NO BRBPR OR MELENA.   Objective: Vital signs in last 24 hours: Filed Vitals:   06/07/15 2133 06/08/15 0500  BP: 115/46 122/58  Pulse: 76 80  Temp: 98.4 F (36.9 C) 98 F (36.7 C)  Resp: 16 16     General appearance: alert, cooperative and no distress Resp: clear to auscultation bilaterally Cardio: regular rate and rhythm  ABDOMEN: SOFT, NON-TENDER, BOWEL SOUNDS NORMAL  Lab Results:    CBC Latest Ref Rng 06/08/2015 06/07/2015 06/06/2015  WBC 4.0 - 10.5 K/uL 7.4 10.5 12.6(H)  Hemoglobin 13.0 - 17.0 g/dL 3.6(U8.5(L) 4.4(I9.2(L) 7.8(L)  Hematocrit 39.0 - 52.0 % 26.2(L) 27.7(L) 23.8(L)  Platelets 150 - 400 K/uL 162 191 236   Studies/Results: No results found.  Medications: I have reviewed the patient's current medications.   LOS: 5 days   [name] 11/02/2013, 2:23 PM

## 2015-06-08 NOTE — Progress Notes (Signed)
PROGRESS NOTE  Corey BertholdWalter B Orr ZOX:[id number] DOB: [birth year] DOA: 06/06/2015 PCP: Joycelyn RuaMEYERS, STEPHEN, MD  HPI/Recap of past 7624 hours: 82 year old with history of diabetes, hypertension and chronic kidney disease admitted on 1/12 after having 1 day of hematemesis and dark black stool. The emergency room, patient's hemoglobin noted to be 7.8. In addition, his troponin was elevated at 2. Patient admitted to hospitalist service. Transfused 1 unit packed red blood cells. Cardiology and GI consulted  By following day, troponin peaked to as high as 2.4 and then has trended downward. Seen by cardiology who felt that this is more secondary to chronic renal disease and demand ischemia instead of non-STEMI. Patient seen by GI and on evening of 1/14, went for endoscopy which noted a large gastric antral ulcer. Stable with no further bleeding. Patient then started on diet and slowly advanced. He continued on IV Protonix drip.  This morning, patient doing okay. Headache better.  Assessment/Plan: Active Problems:   Diabetes (HCC) CBGs under 200.  Upon discharge, will give information about diabetic diet    Essential hypertension: Antihypertensives on hold for now until blood pressure elevates    CKD (chronic kidney disease) stage 4, GFR 15-29 ml/min (HCC): Stable    upper GI bleed secondary to gastric ulcer with secondary hematemesis and melena: Patient normally on daily baby aspirin plus when necessary Aleve. Status post 1 unit packed with blood cell transfusion. Status post EGD. Continue Protonix infusion. Patient will be discharged on PPI and stop NSAIDs    Elevated troponin: Demand ischemia as well as falsely elevated in setting of chronic kidney disease. Trending downward    Code Status: Full code  Family Communication: Multiple family members at the bedside   Disposition Plan: Likely discharge on Monday 1/16   Consultants:  Cardiology  GI  Procedures:  Echo done 1/14: Grade 1  diastolic dysfunction  EGD done 1/13: Duodenitis, large gastric antral ulcer  Antibiotics:  None    Objective: BP 122/58 mmHg  Pulse 80  Temp(Src) 98 F (36.7 C) (Oral)  Resp 16  Ht 6' (1.829 m)  Wt 125.873 kg (277 lb 8 oz)  BMI 37.63 kg/m2  SpO2 92%  Intake/Output Summary (Last 24 hours) at 06/08/15 1411 Last data filed at 06/08/15 1230  Gross per 24 hour  Intake    240 ml  Output    750 ml  Net   -510 ml   Filed Weights   06/06/15 1939 06/06/15 2238 06/08/15 1319  Weight: 104.327 kg (230 lb) 123.378 kg (272 lb) 125.873 kg (277 lb 8 oz)    Exam: Unchanged from previous day  General:  Alert & oriented x 3, NAD   Cardiovascular: RRR S1S2, II/VI SEM   Respiratory: CTA bilaterally   Abdomen: Soft, distended, mild tenderness in lower quadrants   Musculoskeletal: No edema    Data Reviewed: Basic Metabolic Panel:  Recent Labs Lab 06/06/15 2035 06/07/15 0711 06/08/15 0919  NA 142 142 144  K 4.8 5.0 5.3*  CL 108 110 116*  CO2 21* 22 21*  GLUCOSE 180* 170* 152*  BUN 99* 103* 75*  CREATININE 3.41* 3.45* 2.76*  CALCIUM 9.3 9.0 8.9   Liver Function Tests:  Recent Labs Lab 06/06/15 2035 06/07/15 0711  AST 23 21  ALT 13* 11*  ALKPHOS 46 43  BILITOT 0.6 0.7  PROT 6.7 6.5  ALBUMIN 3.1* 3.1*    Recent Labs Lab 06/06/15 2035  LIPASE 82*   No results for input(s): AMMONIA in the  last 168 hours. CBC:  Recent Labs Lab 06/06/15 2035 06/07/15 0711 06/08/15 0919  WBC 12.6* 10.5 7.4  NEUTROABS 9.4*  --   --   HGB 7.8* 9.2* 8.5*  HCT 23.8* 27.7* 26.2*  MCV 93.0 92.3 94.9  PLT 236 191 162   Cardiac Enzymes:    Recent Labs Lab 06/06/15 2210 06/07/15 0711 06/07/15 1154 06/07/15 1700 06/08/15 0919  TROPONINI 2.03* 2.26* 1.82* 1.67* 0.79*   BNP (last 3 results) No results for input(s): BNP in the last 8760 hours.  ProBNP (last 3 results) No results for input(s): PROBNP in the last 8760 hours.  CBG:  Recent Labs Lab 06/07/15 1459  06/07/15 1710 06/08/15 0540 06/08/15 0744 06/08/15 1142  GLUCAP 125* 138* 103* 121* 133*    No results found for this or any previous visit (from the past 240 hour(s)).   Studies: No results found.  Scheduled Meds: . antiseptic oral rinse  7 mL Mouth Rinse BID  . insulin aspart  0-9 Units Subcutaneous TID WC    Continuous Infusions: . sodium chloride 100 mL/hr at 06/08/15 0931  . sodium chloride 1,000 mL (06/07/15 1503)  . pantoprozole (PROTONIX) infusion 8 mg/hr (06/08/15 1238)     Time spent: 15 minutes   Hollice Espy  [facility] Pager [phone] . If 7PM-7AM, please contact night-coverage at [email or web address], password [facility] 06/08/2015, 2:11 PM  LOS: 2 days

## 2015-06-09 ENCOUNTER — Encounter (HOSPITAL_COMMUNITY)
Admission: EM | Disposition: A | Discharge status: Home / Self Care | Payer: Self-pay | Source: Home / Self Care | Attending: Internal Medicine

## 2015-06-09 LAB — GLUCOSE, CAPILLARY
GLUCOSE-CAPILLARY: 147 mg/dL — AB (ref 65–99)
GLUCOSE-CAPILLARY: 185 mg/dL — AB (ref 65–99)
GLUCOSE-CAPILLARY: 167 mg/dL — AB (ref 65–99)
Glucose-Capillary: 162 mg/dL — ABNORMAL HIGH (ref 65–99)
Glucose-Capillary: 147 mg/dL — ABNORMAL HIGH (ref 65–99)

## 2015-06-09 LAB — BASIC METABOLIC PANEL
ANION GAP: 6 (ref 5–15)
BUN: 51 mg/dL — AB (ref 6–20)
CALCIUM: 8.8 mg/dL — AB (ref 8.9–10.3)
CO2: 22 mmol/L (ref 22–32)
Chloride: 115 mmol/L — ABNORMAL HIGH (ref 101–111)
Creatinine, Ser: 2.46 mg/dL — ABNORMAL HIGH (ref 0.61–1.24)
GFR calc Af Amer: 27 mL/min — ABNORMAL LOW (ref >60)
GFR calc non Af Amer: 23 mL/min — ABNORMAL LOW (ref >60)
GLUCOSE: 191 mg/dL — AB (ref 65–99)
Potassium: 4.7 mmol/L (ref 3.5–5.1)
Sodium: 143 mmol/L (ref 135–145)

## 2015-06-09 LAB — MAGNESIUM: MAGNESIUM: 2 mg/dL (ref 1.7–2.4)

## 2015-06-09 SURGERY — EGD (ESOPHAGOGASTRODUODENOSCOPY)
Anesthesia: Moderate Sedation

## 2015-06-09 MED ORDER — CITALOPRAM HYDROBROMIDE 20 MG PO TABS
40.0000 mg | ORAL_TABLET | Freq: Every day | ORAL | Status: DC
  Administered 2015-06-09 – 2015-06-10 (×2): 40 mg via ORAL
  Filled 2015-06-09 (×2): qty 2

## 2015-06-09 MED ORDER — FUROSEMIDE 40 MG PO TABS
40.0000 mg | ORAL_TABLET | Freq: Every day | ORAL | Status: DC
  Administered 2015-06-09 – 2015-06-10 (×2): 40 mg via ORAL
  Filled 2015-06-09: qty 1

## 2015-06-09 MED ORDER — ATORVASTATIN CALCIUM 20 MG PO TABS
20.0000 mg | ORAL_TABLET | Freq: Every day | ORAL | Status: DC
  Administered 2015-06-09 – 2015-06-10 (×2): 20 mg via ORAL
  Filled 2015-06-09 (×2): qty 1

## 2015-06-09 NOTE — Progress Notes (Addendum)
7 beat run of V-Tach, on call MD notified.  Patient nonsymptomatic.  New orders obtained. Will continue to monitor

## 2015-06-09 NOTE — Progress Notes (Signed)
Patient ID: [name], male   DOB: 1934-04-27, 82 y.o.   MRN: [id number]  Assessment/Plan: ADMITTED WITH UGIB DUE TO LARGE GASTRIC ULCER. NO BRBPR OR MELENA. HB STABLE.  PLAN: 1. COMPLETE PROTONIX AT 4 PM JAN 16 2. SUPPORTIVE CARE  Subjective: Since I last evaluated the patient HE HAS NO COMPLAINTS OR CONCERNS.  Objective: Vital signs in last 24 hours: Filed Vitals:   06/08/15 2050 06/09/15 0534  BP: 135/46 143/57  Pulse: 70 91  Temp: 97.9 F (36.6 C) 98.2 F (36.8 C)  Resp: 16 16   General appearance: alert, cooperative and no distress Resp: clear to auscultation bilaterally Cardio: regular rate and rhythm GI: soft, non-tender; bowel sounds normal; no masses,  no organomegaly  Lab Results:  CBC Latest Ref Rng 06/08/2015 06/07/2015 06/06/2015  WBC 4.0 - 10.5 K/uL 7.4 10.5 12.6(H)  Hemoglobin 13.0 - 17.0 g/dL 8.6(V8.5(L) 7.8(I9.2(L) 7.8(L)  Hematocrit 39.0 - 52.0 % 26.2(L) 27.7(L) 23.8(L)  Platelets 150 - 400 K/uL 162 191 236   Studies/Results: No results found.  Medications: I have reviewed the patient's current medications.   LOS: 5 days   [name] 11/02/2013, 2:23 PM

## 2015-06-09 NOTE — Progress Notes (Signed)
PROGRESS NOTE  Patsey BertholdWalter B Shedd BJY:[id number] DOB: 1933-06-28 DOA: 06/06/2015 PCP: Joycelyn RuaMEYERS, STEPHEN, MD  HPI/Recap of past 6924 hours: 82 year old with history of diabetes, hypertension and chronic kidney disease admitted on 1/12 after having 1 day of hematemesis and dark black stool. The emergency room, patient's hemoglobin noted to be 7.8. In addition, his troponin was elevated at 2. Patient admitted to hospitalist service. Transfused 1 unit packed red blood cells. Cardiology and GI consulted  By following day, troponin peaked to as high as 2.4 and then has trended downward. Seen by cardiology who felt that this is more secondary to chronic renal disease and demand ischemia instead of non-STEMI. Patient seen by GI and on evening of 1/14, went for endoscopy which noted a large gastric antral ulcer. Stable with no further bleeding. Patient then started on diet and slowly advanced. He continued on IV Protonix drip.  Overnight, patient had a 7 beat run of V. tach. Nonsustained. Patient asymptomatic. On telemetry with no further episodes. This morning, patient with no complaints  Assessment/Plan: Active Problems:   Diabetes (HCC) CBGs continuing under 200.  Upon discharge, will give information about diabetic diet    Essential hypertension: Antihypertensives on hold for now until blood pressure elevates    CKD (chronic kidney disease) stage 4, GFR 15-29 ml/min (HCC): Stable    upper GI bleed secondary to gastric ulcer with secondary hematemesis and melena: Patient normally on daily baby aspirin plus when necessary Aleve. Status post 1 unit packed with blood cell transfusion. Status post EGD. Continue Protonix infusion. Patient will be discharged on PPI and stop NSAIDs    Elevated troponin: Demand ischemia as well as falsely elevated in setting of chronic kidney disease. Trending downward.  No evidence of non-STEMI    Code Status: Full code  Family Communication: Wife and daughter at the  bedside   Disposition Plan: Likely discharge on Monday 1/16   Consultants:  Cardiology  GI  Procedures:  Echo done 1/14: Grade 1 diastolic dysfunction  EGD done 1/13: Duodenitis, large gastric antral ulcer  Antibiotics:  None    Objective: BP 121/82 mmHg  Pulse 67  Temp(Src) 97.7 F (36.5 C) (Oral)  Resp 16  Ht 6' (1.829 m)  Wt 125.873 kg (277 lb 8 oz)  BMI 37.63 kg/m2  SpO2 99%  Intake/Output Summary (Last 24 hours) at 06/09/15 1555 Last data filed at 06/09/15 0830  Gross per 24 hour  Intake 3452.92 ml  Output   1400 ml  Net 2052.92 ml   Filed Weights   06/06/15 1939 06/06/15 2238 06/08/15 1319  Weight: 104.327 kg (230 lb) 123.378 kg (272 lb) 125.873 kg (277 lb 8 oz)    Exam:   General:  Alert & oriented x 3, NAD   Cardiovascular: RRR S1S2, II/VI SEM   Respiratory: CTA bilaterally   Abdomen: Soft, distended, mild tenderness in lower quadrants   Musculoskeletal:trace pitting edema   Data Reviewed: Basic Metabolic Panel:  Recent Labs Lab 06/06/15 2035 06/07/15 0711 06/08/15 0919 06/09/15 0021 06/09/15 1109  NA 142 142 144  --  143  K 4.8 5.0 5.3*  --  4.7  CL 108 110 116*  --  115*  CO2 21* 22 21*  --  22  GLUCOSE 180* 170* 152*  --  191*  BUN 99* 103* 75*  --  51*  CREATININE 3.41* 3.45* 2.76*  --  2.46*  CALCIUM 9.3 9.0 8.9  --  8.8*  MG  --   --   --  2.0  --    Liver Function Tests:  Recent Labs Lab 06/06/15 2035 06/07/15 0711  AST 23 21  ALT 13* 11*  ALKPHOS 46 43  BILITOT 0.6 0.7  PROT 6.7 6.5  ALBUMIN 3.1* 3.1*    Recent Labs Lab 06/06/15 2035  LIPASE 82*   No results for input(s): AMMONIA in the last 168 hours. CBC:  Recent Labs Lab 06/06/15 2035 06/07/15 0711 06/08/15 0919  WBC 12.6* 10.5 7.4  NEUTROABS 9.4*  --   --   HGB 7.8* 9.2* 8.5*  HCT 23.8* 27.7* 26.2*  MCV 93.0 92.3 94.9  PLT 236 191 162   Cardiac Enzymes:    Recent Labs Lab 06/06/15 2210 06/07/15 0711 06/07/15 1154 06/07/15 1700  06/08/15 0919  TROPONINI 2.03* 2.26* 1.82* 1.67* 0.79*   BNP (last 3 results) No results for input(s): BNP in the last 8760 hours.  ProBNP (last 3 results) No results for input(s): PROBNP in the last 8760 hours.  CBG:  Recent Labs Lab 06/08/15 1142 06/08/15 1649 06/08/15 2048 06/09/15 0758 06/09/15 1147  GLUCAP 133* 178* 133* 147* 162*    No results found for this or any previous visit (from the past 240 hour(s)).   Studies: No results found.  Scheduled Meds: . antiseptic oral rinse  7 mL Mouth Rinse BID  . furosemide  40 mg Oral Daily  . insulin aspart  0-9 Units Subcutaneous TID WC    Continuous Infusions: . sodium chloride 10 mL/hr at 06/09/15 1137  . pantoprozole (PROTONIX) infusion 8 mg/hr (06/09/15 1223)     Time spent: 15 minutes   Hollice Espy  [facility] Pager [phone] . If 7PM-7AM, please contact night-coverage at [email or web address], password [facility] 06/09/2015, 3:55 PM  LOS: 3 days

## 2015-06-10 ENCOUNTER — Telehealth: Payer: Self-pay | Admitting: Gastroenterology

## 2015-06-10 ENCOUNTER — Encounter (HOSPITAL_COMMUNITY): Payer: Self-pay | Admitting: Gastroenterology

## 2015-06-10 DIAGNOSIS — K254 Chronic or unspecified gastric ulcer with hemorrhage: Principal | ICD-10-CM

## 2015-06-10 DIAGNOSIS — D62 Acute posthemorrhagic anemia: Secondary | ICD-10-CM

## 2015-06-10 LAB — BASIC METABOLIC PANEL
ANION GAP: 6 (ref 5–15)
BUN: 40 mg/dL — ABNORMAL HIGH (ref 6–20)
CALCIUM: 8.7 mg/dL — AB (ref 8.9–10.3)
CO2: 22 mmol/L (ref 22–32)
Chloride: 112 mmol/L — ABNORMAL HIGH (ref 101–111)
Creatinine, Ser: 2.29 mg/dL — ABNORMAL HIGH (ref 0.61–1.24)
GFR, EST AFRICAN AMERICAN: 30 mL/min — AB (ref >60)
GFR, EST NON AFRICAN AMERICAN: 25 mL/min — AB (ref >60)
Glucose, Bld: 168 mg/dL — ABNORMAL HIGH (ref 65–99)
Potassium: 4.3 mmol/L (ref 3.5–5.1)
Sodium: 140 mmol/L (ref 135–145)

## 2015-06-10 LAB — HEMOGLOBIN AND HEMATOCRIT, BLOOD
HEMATOCRIT: 23.5 % — AB (ref 39.0–52.0)
HEMATOCRIT: 28.2 % — AB (ref 39.0–52.0)
HEMOGLOBIN: 7.8 g/dL — AB (ref 13.0–17.0)
HEMOGLOBIN: 9.1 g/dL — AB (ref 13.0–17.0)

## 2015-06-10 LAB — HEMOGLOBIN A1C
HEMOGLOBIN A1C: 7 % — AB (ref 4.8–5.6)
Mean Plasma Glucose: 154 mg/dL

## 2015-06-10 LAB — PREPARE RBC (CROSSMATCH)

## 2015-06-10 MED ORDER — OMEPRAZOLE MAGNESIUM 20 MG PO TBEC: DELAYED_RELEASE_TABLET | ORAL | Status: DC

## 2015-06-10 MED ORDER — PANTOPRAZOLE SODIUM 40 MG PO TBEC
40.0000 mg | DELAYED_RELEASE_TABLET | Freq: Two times a day (BID) | ORAL | Status: DC
  Administered 2015-06-10 – 2015-06-11 (×2): 40 mg via ORAL
  Filled 2015-06-10 (×2): qty 1

## 2015-06-10 MED ORDER — FUROSEMIDE 10 MG/ML IJ SOLN
20.0000 mg | Freq: Once | INTRAMUSCULAR | Status: AC
  Administered 2015-06-10: 20 mg via INTRAVENOUS
  Filled 2015-06-10: qty 2

## 2015-06-10 MED ORDER — SODIUM CHLORIDE 0.9 % IV SOLN: Freq: Once | INTRAVENOUS | Status: DC

## 2015-06-10 MED ORDER — CITALOPRAM HYDROBROMIDE 40 MG PO TABS: 20.0000 mg | ORAL_TABLET | Freq: Every day | ORAL | Status: AC

## 2015-06-10 MED ORDER — FUROSEMIDE 10 MG/ML IJ SOLN
20.0000 mg | Freq: Once | INTRAMUSCULAR | Status: AC
  Administered 2015-06-10: 20 mg via INTRAVENOUS

## 2015-06-10 NOTE — Progress Notes (Signed)
REVIEWED-NO ADDITIONAL RECOMMENDATIONS.   Subjective: Denies abdominal pain, N/V. Unsure of the year. No overt GI bleeding per nursing staff.   Objective: Vital signs in last 24 hours: Temp:  [97.7 F (36.5 C)-98.7 F (37.1 C)] 98.7 F (37.1 C) (01/16 0607) Pulse Rate:  [67-91] 81 (01/16 0607) Resp:  [16] 16 (01/16 0607) BP: (121-148)/(50-82) 148/69 mmHg (01/16 0607) SpO2:  [99 %] 99 % (01/16 0607) Last BM Date: 06/09/15 General:   Alert and oriented to person, place, unsure year  Head:  Normocephalic and atraumatic. Abdomen:  Bowel sounds present, obese, non-tender, distended but soft Neurologic:  Alert and  oriented to person and place. Confused regarding year  Psych:  Flat affect   Intake/Output from previous day: 01/15 0701 - 01/16 0700 In: 1718.5 [P.O.:720; I.V.:998.5] Out: 1300 [Urine:1300] Intake/Output this shift:    Lab Results:  Recent Labs  06/08/15 0919 06/10/15 0542  WBC 7.4  --   HGB 8.5* 7.8*  HCT 26.2* 23.5*  PLT 162  --    BMET  Recent Labs  06/08/15 0919 06/09/15 1109 06/10/15 0542  NA 144 143 140  K 5.3* 4.7 4.3  CL 116* 115* 112*  CO2 21* 22 22  GLUCOSE 152* 191* 168*  BUN 75* 51* 40*  CREATININE 2.76* 2.46* 2.29*  CALCIUM 8.9 8.8* 8.7*    Assessment: 82 year old male admitted with UGI bleed secondary to large antral ulcer, with admitting Hgb 7.8. Received total of 2 units PRBCs this admission. Hgb had improved to 9.2. 2 days ago 8.5, now drifted to 7.8 without any evidence of overt GI bleeding. Appears his baseline is in the 8-10 range upon review of records. May need additional unit if further decline in Hgb. Would recheck today at 1500.    Plan: Protonix drip discontinued around 2000 last night; first dose of Protonix 40 mg now and will need BID PPI therapy for 3 months  Recheck H/H at 1500 today: may need additional unit of PRBCs Follow-up on biopsies Repeat EGD in 3 months to assess healing H/H in am  Nira RetortAnna W. Sams,  ANP-BC [facility]     LOS: 4 days    06/10/2015, 8:04 AM

## 2015-06-10 NOTE — Telephone Encounter (Signed)
Please arrange office visit in 8 weeks to discuss repeat EGD.

## 2015-06-10 NOTE — Discharge Summary (Addendum)
Discharge Summary  [name] ZOX:[id number] DOB: [birth year]  PCP: Joycelyn Rua, MD  Admit date: 06/06/2015 Anticipated Discharge date: 06/11/2015  Time spent: 35 minutes   Recommendations for Outpatient Follow-up:  1. New medication: Prilosec 20 mg by mouth twice a day 3 months, then by mouth daily 2. Medication change: Patient will stop taking when necessary Aleve, aspirin and all NSAIDs 3. Medication change: Patient will stop Zantac 4. Medication change: Patient's Celexa will decrease from 40 mg to 20 mg by mouth daily (maximum dose recommended for patients above 65)  5. Patient will follow up with GI in the next month  Discharge Diagnoses:  Active Hospital Problems   Diagnosis Date Noted  . UGI bleed   . Acute blood loss anemia   . Elevated troponin   . Bleeding gastrointestinal   . Nausea vomiting and diarrhea   . GI bleeding 06/06/2015  . GI bleed 06/06/2015  . Anemia 07/26/2013  . CKD (chronic kidney disease) stage 4, GFR 15-29 ml/min (HCC) 07/23/2013  . Diabetes (HCC) 10/08/2008  . Essential hypertension 10/08/2008    Resolved Hospital Problems   Diagnosis Date Noted Date Resolved  No resolved problems to display.    Discharge Condition: Improved, being discharged home   Diet recommendation: Carb modified, heart healthy  Filed Weights   06/06/15 1939 06/06/15 2238 06/08/15 1319  Weight: 104.327 kg (230 lb) 123.378 kg (272 lb) 125.873 kg (277 lb 8 oz)    History of present illness:  82 year old with history of diabetes, hypertension and chronic kidney disease admitted on 1/12 after having 1 day of hematemesis and dark black stool. The emergency room, patient's hemoglobin noted to be 7.8. In addition, his troponin was elevated at 2. Patient admitted to hospitalist service. Transfused 1 unit packed red blood cells. Cardiology and GI consulted   Hospital Course:  Active Problems:   Diabetes [facility]): Well controlled with chronic kidney disease. Hemoglobin  A1c at 7.0. Patient covered with sliding scale during hospitalization. We'll continue by mouth medications upon discharge. Information given about diabetic diet.    Essential hypertension: Stable. Patient will be discharged on home medications.    CKD (chronic kidney disease) stage 4, GFR 15-29 ml/min (HCC): Stable. Following initial presentation of blood loss anemia, transfused and hydrated. By day of discharge, creatinine is 2.29 with GFR 25.    Elevated troponin: The following day, troponin peak to as high as 2.4 and then trended downward. Patient seen by cardiology who felt that this was more secondary to chronic renal disease plus demand ischemia and sedative non-STEMI. Underwent 2-D echo which noted grade 1 diastolic dysfunction.  Chronic diastolic heart failure: Noted on echocardiogram. Euvolemic. Prior to discharge, given Lasix before and after unit of blood transfusion. No signs of volume overload.    UGI bleed secondary to antral ulcer causing acute blood loss anemia: Patient seen by GI. After being cleared by cardiology, went for endoscopy noting large gastric antral ulcer. There is stability with no further bleeding. Patient was started on an IV Protonix infusion. This was changed over to PPI twice a day. Her medications to stop NSAIDs and to continue PPI twice a day 3 months, then changed to daily. Patient had no further bleeding episodes, although by 1/16, hemoglobin at 7.8. This was more felt to be hemodilution as creatinine continue to improve with IV fluids. Patient prior to discharge given 1 additional unit packed red blood cells  Depression: Patient normally on Celexa. As per pharmacy recommendations, maximum dose should  be 20 mg, so decrease home dose of 40  Consultants:  Cardiology  GI  Procedures:  Echo done 1/14: Grade 1 diastolic dysfunction  EGD done 1/13: Duodenitis, large gastric antral ulcer  Transfusion of blood done on 1/12 and 1/16  Discharge Exam: BP  129/64 mmHg  Pulse 91  Temp(Src) 98.8 F (37.1 C) (Oral)  Resp 18  Ht 6' (1.829 m)  Wt 125.873 kg (277 lb 8 oz)  BMI 37.63 kg/m2  SpO2 100%  General: alert and oriented 3  Cardiovascular: regular rate and rhythm, S1-S2, 2/6 systolic murmur  Respiratory: clear to auscultation bilaterally   Discharge Instructions You were cared for by a hospitalist during your hospital stay. If you have any questions about your discharge medications or the care you received while you were in the hospital after you are discharged, you can call the unit and asked to speak with the hospitalist on call if the hospitalist that took care of you is not available. Once you are discharged, your primary care physician will handle any further medical issues. Please note that NO REFILLS for any discharge medications will be authorized once you are discharged, as it is imperative that you return to your primary care physician (or establish a relationship with a primary care physician if you do not have one) for your aftercare needs so that they can reassess your need for medications and monitor your lab values.     Medication List    STOP taking these medications        aspirin 81 MG tablet     naproxen 500 MG tablet  Commonly known as:  NAPROSYN     ranitidine 300 MG tablet  Commonly known as:  ZANTAC      TAKE these medications        albuterol 108 (90 Base) MCG/ACT inhaler  Commonly known as:  PROVENTIL HFA;VENTOLIN HFA  Inhale 2 puffs into the lungs every 4 (four) hours as needed for wheezing or shortness of breath.     atorvastatin 20 MG tablet  Commonly known as:  LIPITOR  TAKE 1 TABLET BY MOUTH AT BEDTIME FOR CHOLESTEROL.     cefdinir 300 MG capsule  Commonly known as:  OMNICEF     citalopram 40 MG tablet  Commonly known as:  CELEXA  Take 0.5 tablets (20 mg total) by mouth daily.     furosemide 20 MG tablet  Commonly known as:  LASIX  Take 40 mg by mouth daily.     gabapentin 100 MG  capsule  Commonly known as:  NEURONTIN  Take 100 mg by mouth 3 (three) times daily.     HYDROcodone-acetaminophen 5-325 MG tablet  Commonly known as:  NORCO/VICODIN  Take 1-2 tablets by mouth every 6 (six) hours as needed for moderate pain.     insulin detemir 100 UNIT/ML injection  Commonly known as:  LEVEMIR  Inject 0.25 mLs (25 Units total) into the skin daily.     omeprazole 20 MG tablet  Commonly known as:  PRILOSEC OTC  20mg  po bid x 3 months, then daily after     pioglitazone 45 MG tablet  Commonly known as:  ACTOS  Take 45 mg by mouth daily.     potassium chloride 10 MEQ CR capsule  Commonly known as:  MICRO-K  Take 10 mEq by mouth daily.     SPIRIVA RESPIMAT 2.5 MCG/ACT Aers  Generic drug:  Tiotropium Bromide Monohydrate  Inhale 1 puff into the lungs daily.  No Known Allergies    The results of significant diagnostics from this hospitalization (including imaging, microbiology, ancillary and laboratory) are listed below for reference.    Significant Diagnostic Studies: Dg Chest Port 1 View  06/06/2015  CLINICAL DATA:  Nausea, vomiting and weakness. Intermittent shortness of breath. EXAM: PORTABLE CHEST 1 VIEW COMPARISON:  05/22/2015 FINDINGS: Patient is post median sternotomy. Cardiomediastinal contours are unchanged allowing for differences in technique with borderline cardiomegaly and tortuous atherosclerotic thoracic aorta. No pulmonary edema, pleural effusion or pneumothorax. No confluent airspace disease. Osseous structures are unchanged. IMPRESSION: No acute pulmonary process. Electronically Signed   By: Rubye OaksMelanie  Ehinger M.D.   On: 06/06/2015 21:20    Microbiology: No results found for this or any previous visit (from the past 240 hour(s)).   Labs: Basic Metabolic Panel:  Recent Labs Lab 06/06/15 2035 06/07/15 0711 06/08/15 0919 06/09/15 0021 06/09/15 1109 06/10/15 0542  NA 142 142 144  --  143 140  K 4.8 5.0 5.3*  --  4.7 4.3  CL 108 110  116*  --  115* 112*  CO2 21* 22 21*  --  22 22  GLUCOSE 180* 170* 152*  --  191* 168*  BUN 99* 103* 75*  --  51* 40*  CREATININE 3.41* 3.45* 2.76*  --  2.46* 2.29*  CALCIUM 9.3 9.0 8.9  --  8.8* 8.7*  MG  --   --   --  2.0  --   --    Liver Function Tests:  Recent Labs Lab 06/06/15 2035 06/07/15 0711  AST 23 21  ALT 13* 11*  ALKPHOS 46 43  BILITOT 0.6 0.7  PROT 6.7 6.5  ALBUMIN 3.1* 3.1*    Recent Labs Lab 06/06/15 2035  LIPASE 82*   No results for input(s): AMMONIA in the last 168 hours. CBC:  Recent Labs Lab 06/06/15 2035 06/07/15 0711 06/08/15 0919 06/10/15 0542  WBC 12.6* 10.5 7.4  --   NEUTROABS 9.4*  --   --   --   HGB 7.8* 9.2* 8.5* 7.8*  HCT 23.8* 27.7* 26.2* 23.5*  MCV 93.0 92.3 94.9  --   PLT 236 191 162  --    Cardiac Enzymes:  Recent Labs Lab 06/06/15 2210 06/07/15 0711 06/07/15 1154 06/07/15 1700 06/08/15 0919  TROPONINI 2.03* 2.26* 1.82* 1.67* 0.79*   BNP: BNP (last 3 results) No results for input(s): BNP in the last 8760 hours.  ProBNP (last 3 results) No results for input(s): PROBNP in the last 8760 hours.  CBG:  Recent Labs Lab 06/09/15 0758 06/09/15 1147 06/09/15 1637 06/09/15 2046 06/09/15 2131  GLUCAP 147* 162* 147* 185* 167*       Signed:  Aliviana Burdell K  [facility] 06/10/2015, 2:48 PM

## 2015-06-11 LAB — TYPE AND SCREEN
ABO/RH(D): O POS
ANTIBODY SCREEN: NEGATIVE
Unit division: 0

## 2015-06-11 NOTE — Care Management Important Message (Signed)
Important Message  Patient Details  Name: [name] MRN: [id number] Date of Birth: November 30, 1933   Medicare Important Message Given:  Yes    Malcolm Metro, RN 06/11/2015, 8:43 AM

## 2015-06-11 NOTE — Care Management Note (Signed)
Case Management Note  Patient Details  Name: [name] MRN: [id number] Date of Birth: 08-01-1933   Expected Discharge Date:  06/08/15               Expected Discharge Plan:  Home/Self Care  In-House Referral:  NA  Discharge planning Services  NA  Post Acute Care Choice:  NA Choice offered to:  NA  DME Arranged:    DME Agency:     HH Arranged:    HH Agency:     Status of Service:  Completed, signed off  Medicare Important Message Given:  Yes Date Medicare IM Given:    Medicare IM give by:    Date Additional Medicare IM Given:    Additional Medicare Important Message give by:     If discussed at Long Length of Stay Meetings, dates discussed:  06/11/2015  Additional Comments: Pt discharged home today with self care and 24/7 PD caregiver. No CM needs.  Malcolm Metro, RN 06/11/2015, 8:43 AM

## 2015-06-14 ENCOUNTER — Encounter: Payer: Self-pay | Admitting: Gastroenterology

## 2015-06-14 LAB — GLUCOSE, CAPILLARY
GLUCOSE-CAPILLARY: 153 mg/dL — AB (ref 65–99)
GLUCOSE-CAPILLARY: 159 mg/dL — AB (ref 65–99)
GLUCOSE-CAPILLARY: 167 mg/dL — AB (ref 65–99)
Glucose-Capillary: 163 mg/dL — ABNORMAL HIGH (ref 65–99)

## 2015-06-14 NOTE — Telephone Encounter (Signed)
APPT MADE AND LETTER SENT  °

## 2015-06-26 ENCOUNTER — Telehealth: Payer: Self-pay | Admitting: Gastroenterology

## 2015-06-26 NOTE — Telephone Encounter (Signed)
Please call pt. His stomach Bx shows AN ULCER FROM ASA USE.  CONTINUE OMEPRAZOLE.  TAKE 30 MINUTES PRIOR TO YOUR MEALS TWICE DAILY. YOU CAN RE-START ASA FEB 3. REPEAT EGD APR 2017.

## 2015-06-27 NOTE — Telephone Encounter (Signed)
Tried to call with no answer  

## 2015-06-27 NOTE — Telephone Encounter (Signed)
Wife is aware of results.

## 2015-06-27 NOTE — Telephone Encounter (Signed)
NIC for EGD in April 2017

## 2015-07-25 ENCOUNTER — Encounter: Payer: Self-pay | Admitting: Orthopaedic Surgery

## 2015-07-25 ENCOUNTER — Ambulatory Visit: Payer: Medicare Other | Admitting: Orthopaedic Surgery

## 2015-07-28 ENCOUNTER — Encounter (HOSPITAL_COMMUNITY): Payer: Self-pay | Admitting: Emergency Medicine

## 2015-07-28 ENCOUNTER — Inpatient Hospital Stay (HOSPITAL_COMMUNITY)
Admission: EM | Admit: 2015-07-28 | Discharge: 2015-08-01 | DRG: 191 | Disposition: A | Discharge status: Home Health | Payer: Medicare Other | Attending: Internal Medicine | Admitting: Internal Medicine

## 2015-07-28 ENCOUNTER — Emergency Department (HOSPITAL_COMMUNITY): Payer: Medicare Other

## 2015-07-28 DIAGNOSIS — R0602 Shortness of breath: Secondary | ICD-10-CM | POA: Diagnosis not present

## 2015-07-28 DIAGNOSIS — J441 Chronic obstructive pulmonary disease with (acute) exacerbation: Secondary | ICD-10-CM | POA: Diagnosis present

## 2015-07-28 DIAGNOSIS — H9190 Unspecified hearing loss, unspecified ear: Secondary | ICD-10-CM | POA: Diagnosis present

## 2015-07-28 DIAGNOSIS — Z951 Presence of aortocoronary bypass graft: Secondary | ICD-10-CM

## 2015-07-28 DIAGNOSIS — I25110 Atherosclerotic heart disease of native coronary artery with unstable angina pectoris: Secondary | ICD-10-CM | POA: Diagnosis present

## 2015-07-28 DIAGNOSIS — I200 Unstable angina: Secondary | ICD-10-CM | POA: Diagnosis present

## 2015-07-28 DIAGNOSIS — R079 Chest pain, unspecified: Secondary | ICD-10-CM | POA: Diagnosis present

## 2015-07-28 DIAGNOSIS — E1165 Type 2 diabetes mellitus with hyperglycemia: Secondary | ICD-10-CM | POA: Diagnosis present

## 2015-07-28 DIAGNOSIS — E875 Hyperkalemia: Secondary | ICD-10-CM | POA: Diagnosis present

## 2015-07-28 DIAGNOSIS — I129 Hypertensive chronic kidney disease with stage 1 through stage 4 chronic kidney disease, or unspecified chronic kidney disease: Secondary | ICD-10-CM | POA: Diagnosis present

## 2015-07-28 DIAGNOSIS — N184 Chronic kidney disease, stage 4 (severe): Secondary | ICD-10-CM | POA: Diagnosis not present

## 2015-07-28 DIAGNOSIS — K219 Gastro-esophageal reflux disease without esophagitis: Secondary | ICD-10-CM | POA: Diagnosis present

## 2015-07-28 DIAGNOSIS — D649 Anemia, unspecified: Secondary | ICD-10-CM | POA: Diagnosis present

## 2015-07-28 DIAGNOSIS — Z66 Do not resuscitate: Secondary | ICD-10-CM | POA: Diagnosis present

## 2015-07-28 DIAGNOSIS — E1122 Type 2 diabetes mellitus with diabetic chronic kidney disease: Secondary | ICD-10-CM | POA: Diagnosis not present

## 2015-07-28 DIAGNOSIS — Z6838 Body mass index (BMI) 38.0-38.9, adult: Secondary | ICD-10-CM

## 2015-07-28 DIAGNOSIS — E119 Type 2 diabetes mellitus without complications: Secondary | ICD-10-CM

## 2015-07-28 DIAGNOSIS — Z794 Long term (current) use of insulin: Secondary | ICD-10-CM

## 2015-07-28 DIAGNOSIS — Z87891 Personal history of nicotine dependence: Secondary | ICD-10-CM

## 2015-07-28 DIAGNOSIS — E669 Obesity, unspecified: Secondary | ICD-10-CM | POA: Diagnosis present

## 2015-07-28 DIAGNOSIS — N179 Acute kidney failure, unspecified: Secondary | ICD-10-CM | POA: Diagnosis present

## 2015-07-28 DIAGNOSIS — Z85828 Personal history of other malignant neoplasm of skin: Secondary | ICD-10-CM

## 2015-07-28 DIAGNOSIS — Z8249 Family history of ischemic heart disease and other diseases of the circulatory system: Secondary | ICD-10-CM

## 2015-07-28 DIAGNOSIS — E785 Hyperlipidemia, unspecified: Secondary | ICD-10-CM | POA: Diagnosis present

## 2015-07-28 DIAGNOSIS — Z96642 Presence of left artificial hip joint: Secondary | ICD-10-CM | POA: Diagnosis present

## 2015-07-28 LAB — CBC WITH DIFFERENTIAL/PLATELET
Basophils Absolute: 0 10*3/uL (ref 0.0–0.1)
Basophils Relative: 0 %
EOS ABS: 0.3 10*3/uL (ref 0.0–0.7)
EOS PCT: 4 %
HCT: 34.5 % — ABNORMAL LOW (ref 39.0–52.0)
Hemoglobin: 11.4 g/dL — ABNORMAL LOW (ref 13.0–17.0)
LYMPHS ABS: 2.1 10*3/uL (ref 0.7–4.0)
LYMPHS PCT: 28 %
MCH: 30.9 pg (ref 26.0–34.0)
MCHC: 33 g/dL (ref 30.0–36.0)
MCV: 93.5 fL (ref 78.0–100.0)
MONO ABS: 0.8 10*3/uL (ref 0.1–1.0)
MONOS PCT: 12 %
Neutro Abs: 4 10*3/uL (ref 1.7–7.7)
Neutrophils Relative %: 56 %
PLATELETS: 157 10*3/uL (ref 150–400)
RBC: 3.69 MIL/uL — ABNORMAL LOW (ref 4.22–5.81)
RDW: 14.2 % (ref 11.5–15.5)
WBC: 7.2 10*3/uL (ref 4.0–10.5)

## 2015-07-28 LAB — COMPREHENSIVE METABOLIC PANEL
ALT: 18 U/L (ref 17–63)
AST: 25 U/L (ref 15–41)
Albumin: 3.5 g/dL (ref 3.5–5.0)
Alkaline Phosphatase: 85 U/L (ref 38–126)
Anion gap: 6 (ref 5–15)
BUN: 36 mg/dL — ABNORMAL HIGH (ref 6–20)
CHLORIDE: 107 mmol/L (ref 101–111)
CO2: 22 mmol/L (ref 22–32)
CREATININE: 2.17 mg/dL — AB (ref 0.61–1.24)
Calcium: 8.7 mg/dL — ABNORMAL LOW (ref 8.9–10.3)
GFR, EST AFRICAN AMERICAN: 32 mL/min — AB (ref >60)
GFR, EST NON AFRICAN AMERICAN: 27 mL/min — AB (ref >60)
Glucose, Bld: 123 mg/dL — ABNORMAL HIGH (ref 65–99)
POTASSIUM: 5.1 mmol/L (ref 3.5–5.1)
SODIUM: 135 mmol/L (ref 135–145)
Total Bilirubin: 0.7 mg/dL (ref 0.3–1.2)
Total Protein: 7.1 g/dL (ref 6.5–8.1)

## 2015-07-28 LAB — TROPONIN I

## 2015-07-28 LAB — GLUCOSE, CAPILLARY: GLUCOSE-CAPILLARY: 160 mg/dL — AB (ref 65–99)

## 2015-07-28 MED ORDER — ASPIRIN 81 MG PO CHEW
324.0000 mg | CHEWABLE_TABLET | Freq: Once | ORAL | Status: AC
  Administered 2015-07-28: 324 mg via ORAL
  Filled 2015-07-28: qty 4

## 2015-07-28 MED ORDER — ONDANSETRON HCL 4 MG PO TABS: 4.0000 mg | ORAL_TABLET | Freq: Four times a day (QID) | ORAL | Status: DC | PRN

## 2015-07-28 MED ORDER — TIOTROPIUM BROMIDE MONOHYDRATE 2.5 MCG/ACT IN AERS: 1.0000 | INHALATION_SPRAY | Freq: Every day | RESPIRATORY_TRACT | Status: DC

## 2015-07-28 MED ORDER — POTASSIUM CHLORIDE CRYS ER 10 MEQ PO TBCR: 10.0000 meq | EXTENDED_RELEASE_TABLET | Freq: Every day | ORAL | Status: DC

## 2015-07-28 MED ORDER — OMEPRAZOLE MAGNESIUM 20 MG PO TBEC: 20.0000 mg | DELAYED_RELEASE_TABLET | Freq: Every day | ORAL | Status: DC

## 2015-07-28 MED ORDER — GABAPENTIN 100 MG PO CAPS
100.0000 mg | ORAL_CAPSULE | Freq: Three times a day (TID) | ORAL | Status: DC
  Administered 2015-07-28 – 2015-08-01 (×11): 100 mg via ORAL
  Filled 2015-07-28 (×11): qty 1

## 2015-07-28 MED ORDER — ONDANSETRON HCL 4 MG/2ML IJ SOLN
4.0000 mg | Freq: Four times a day (QID) | INTRAMUSCULAR | Status: DC | PRN
  Administered 2015-07-29: 4 mg via INTRAVENOUS
  Filled 2015-07-28: qty 2

## 2015-07-28 MED ORDER — NITROGLYCERIN 2 % TD OINT
0.5000 [in_us] | TOPICAL_OINTMENT | Freq: Four times a day (QID) | TRANSDERMAL | Status: DC
  Administered 2015-07-28 – 2015-08-01 (×16): 0.5 [in_us] via TOPICAL
  Filled 2015-07-28 (×16): qty 1

## 2015-07-28 MED ORDER — SODIUM CHLORIDE 0.9 % IV SOLN: INTRAVENOUS | Status: DC

## 2015-07-28 MED ORDER — NITROGLYCERIN IN D5W 200-5 MCG/ML-% IV SOLN
5.0000 ug/min | Freq: Once | INTRAVENOUS | Status: AC
  Administered 2015-07-28: 5 ug/min via INTRAVENOUS
  Filled 2015-07-28: qty 250

## 2015-07-28 MED ORDER — CITALOPRAM HYDROBROMIDE 20 MG PO TABS
20.0000 mg | ORAL_TABLET | Freq: Every day | ORAL | Status: DC
  Administered 2015-07-29 – 2015-08-01 (×4): 20 mg via ORAL
  Filled 2015-07-28 (×4): qty 1

## 2015-07-28 MED ORDER — ENOXAPARIN SODIUM 40 MG/0.4ML ~~LOC~~ SOLN
40.0000 mg | SUBCUTANEOUS | Status: DC
  Administered 2015-07-28 – 2015-07-30 (×3): 40 mg via SUBCUTANEOUS
  Filled 2015-07-28 (×3): qty 0.4

## 2015-07-28 MED ORDER — PANTOPRAZOLE SODIUM 40 MG PO TBEC
40.0000 mg | DELAYED_RELEASE_TABLET | Freq: Every day | ORAL | Status: DC
Start: 2015-07-28 — End: 2015-08-01
  Administered 2015-07-28 – 2015-08-01 (×5): 40 mg via ORAL
  Filled 2015-07-28 (×5): qty 1

## 2015-07-28 MED ORDER — FUROSEMIDE 40 MG PO TABS
40.0000 mg | ORAL_TABLET | Freq: Every day | ORAL | Status: DC
  Administered 2015-07-29: 40 mg via ORAL
  Filled 2015-07-28: qty 1

## 2015-07-28 MED ORDER — ONDANSETRON HCL 4 MG/2ML IJ SOLN: 4.0000 mg | Freq: Three times a day (TID) | INTRAMUSCULAR | Status: DC | PRN

## 2015-07-28 MED ORDER — ATORVASTATIN CALCIUM 20 MG PO TABS
20.0000 mg | ORAL_TABLET | Freq: Every day | ORAL | Status: DC
  Administered 2015-07-29 – 2015-07-30 (×2): 20 mg via ORAL
  Filled 2015-07-28 (×2): qty 1

## 2015-07-28 MED ORDER — INSULIN ASPART 100 UNIT/ML ~~LOC~~ SOLN: 0.0000 [IU] | Freq: Three times a day (TID) | SUBCUTANEOUS | Status: DC

## 2015-07-28 NOTE — ED Provider Notes (Signed)
CSN: [id number]     Arrival date & time 07/28/15  1408 History   First MD Initiated Contact with Patient 07/28/15 1455     Chief Complaint  Patient presents with  . Chest Pain     (Consider location/radiation/quality/duration/timing/severity/associated sxs/prior Treatment) HPI Comments: The pt is a 82 y/o male - he has hx of CAD - he has had bypass in the pst He had acute onset of pain in the chest this AM with radiation to the bilateral arms and the shoulders and the upper back - similar to prior - has chronic SOB with this - no n/v. Sx are persistent, gradually easing off - mild swelling of legs - no recent URI / v/d Nothing makes this better or worse.  Per medical record, last stress was in 2014, CABG > 10 years ago: Cardiology consult from this year...  " He has a h/o CABG in June 2003 and follows with Dr. Antoine Poche. Underwent a normal dobutamine stress echocardiogram in 06/2012."  Patient is a 82 y.o. male presenting with chest pain. The history is provided by the patient.  Chest Pain   Past Medical History  Diagnosis Date  . Hypertension   . Hyperlipidemia   . Diabetes mellitus, type II, insulin dependent (HCC)   . Coronary atherosclerosis of native coronary artery 2003    Multivessel s/p CABG  . Morbid obesity (HCC)   . GERD (gastroesophageal reflux disease)   . History of tobacco abuse   . Back pain   . Hard of hearing   . Aortic root dilatation (HCC)   . CKD (chronic kidney disease) stage 4, GFR 15-29 ml/min [facility])    Past Surgical History  Procedure Laterality Date  . Appendectomy    . Lumbar disc surgery      x2  . Coronary artery bypass graft  June 2003    LIMA to LAD, SVG to diagonal, sequential SVG to obtuse marginal and circumf SVG to RCAl  . Angioplasty    . Colonoscopy w/ polypectomy    . Skin cancer excision    . Hip arthroplasty Left 07/25/2013    Procedure: ARTHROPLASTY BIPOLAR HIP;  Surgeon: Darreld Mclean, MD;  Location: [facility] ORS;  Service: Orthopedics;   Laterality: Left;  . Esophagogastroduodenoscopy N/A 06/07/2015    Procedure: ESOPHAGOGASTRODUODENOSCOPY (EGD);  Surgeon: West Bali, MD;  Location: [facility] SUITE;  Service: Endoscopy;  Laterality: N/A;  . Cardiac surgery     Family History  Problem Relation Age of Onset  . Heart disease Father   . Heart attack Child    Social History  Substance Use Topics  . Smoking status: Former Smoker -- 1.00 packs/day for 60 years    Types: Cigarettes    Quit date: 05/25/2000  . Smokeless tobacco: Never Used  . Alcohol Use: No    Review of Systems  Cardiovascular: Positive for chest pain.  All other systems reviewed and are negative.     Allergies  Review of patient's allergies indicates no known allergies.  Home Medications   Prior to Admission medications   Medication Sig Start Date End Date Taking? Authorizing Provider  albuterol (PROVENTIL HFA;VENTOLIN HFA) 108 (90 BASE) MCG/ACT inhaler Inhale 2 puffs into the lungs every 4 (four) hours as needed for wheezing or shortness of breath.    Historical Provider, MD  atorvastatin (LIPITOR) 20 MG tablet TAKE 1 TABLET BY MOUTH AT BEDTIME FOR CHOLESTEROL. 02/15/14   Rollene Rotunda, MD  cefdinir (OMNICEF) 300 MG capsule  05/22/15  Historical Provider, MD  citalopram (CELEXA) 40 MG tablet Take 0.5 tablets (20 mg total) by mouth daily. 06/10/15   Hollice EspySendil K Krishnan, MD  furosemide (LASIX) 20 MG tablet Take 40 mg by mouth daily.     Historical Provider, MD  gabapentin (NEURONTIN) 100 MG capsule Take 100 mg by mouth 3 (three) times daily.    Historical Provider, MD  HYDROcodone-acetaminophen (NORCO/VICODIN) 5-325 MG per tablet Take 1-2 tablets by mouth every 6 (six) hours as needed for moderate pain. 07/30/13   Standley Brookinganiel P Goodrich, MD  insulin detemir (LEVEMIR) 100 UNIT/ML injection Inject 0.25 mLs (25 Units total) into the skin daily. Patient taking differently: Inject 25 Units into the skin daily as needed (for high sugar levels).  07/30/13   Standley Brookinganiel P  Goodrich, MD  omeprazole (PRILOSEC OTC) 20 MG tablet 20mg  po bid x 3 months, then daily after 06/10/15   Hollice EspySendil K Krishnan, MD  pioglitazone (ACTOS) 45 MG tablet Take 45 mg by mouth daily.  05/22/15   Historical Provider, MD  potassium chloride (MICRO-K) 10 MEQ CR capsule Take 10 mEq by mouth daily.    Historical Provider, MD  SPIRIVA RESPIMAT 2.5 MCG/ACT AERS Inhale 1 puff into the lungs daily.  03/07/14   Historical Provider, MD   BP 148/74 mmHg  Pulse 65  Temp(Src) 97.7 F (36.5 C) (Oral)  Resp 17  Ht 6' (1.829 m)  Wt 282 lb (127.914 kg)  BMI 38.24 kg/m2  SpO2 99% Physical Exam  Constitutional: He appears well-developed and well-nourished. No distress.  HENT:  Head: Normocephalic and atraumatic.  Mouth/Throat: Oropharynx is clear and moist. No oropharyngeal exudate.  Eyes: Conjunctivae and EOM are normal. Pupils are equal, round, and reactive to light. Right eye exhibits no discharge. Left eye exhibits no discharge. No scleral icterus.  Neck: Normal range of motion. Neck supple. No JVD present. No thyromegaly present.  Cardiovascular: Normal rate, regular rhythm, normal heart sounds and intact distal pulses.  Exam reveals no gallop and no friction rub.   No murmur heard. Pulmonary/Chest: Effort normal and breath sounds normal. No respiratory distress. He has no wheezes. He has no rales.  Abdominal: Soft. Bowel sounds are normal. He exhibits no distension and no mass. There is no tenderness.  Musculoskeletal: Normal range of motion. He exhibits edema ( 2+ pitting bilaterally). He exhibits no tenderness.  Lymphadenopathy:    He has no cervical adenopathy.  Neurological: He is alert. Coordination normal.  Skin: Skin is warm and dry. No rash noted. No erythema.  Psychiatric: He has a normal mood and affect. His behavior is normal.  Nursing note and vitals reviewed.   ED Course  Procedures (including critical care time) Labs Review Labs Reviewed  CBC WITH DIFFERENTIAL/PLATELET -  Abnormal; Notable for the following:    RBC 3.69 (*)    Hemoglobin 11.4 (*)    HCT 34.5 (*)    All other components within normal limits  COMPREHENSIVE METABOLIC PANEL - Abnormal; Notable for the following:    Glucose, Bld 123 (*)    BUN 36 (*)    Creatinine, Ser 2.17 (*)    Calcium 8.7 (*)    GFR calc non Af Amer 27 (*)    GFR calc Af Amer 32 (*)    All other components within normal limits  TROPONIN I    Imaging Review Dg Chest Portable 1 View  07/28/2015  CLINICAL DATA:  Chest pain radiating to the shoulders and arms bilaterally. EXAM: PORTABLE CHEST 1 VIEW  COMPARISON:  06/06/2015 FINDINGS: Previous median sternotomy and CABG. Chronic mild cardiac enlargement. Chronic atherosclerosis of the aorta. Pulmonary vascularity is normal. The lungs are clear. No effusions. Bony structures unremarkable. IMPRESSION: Previous CABG.  No evidence of heart failure. Electronically Signed   By: Paulina Fusi M.D.   On: 07/28/2015 15:22   I have personally reviewed and evaluated these images and lab results as part of my medical decision-making.  ED ECG REPORT  I personally interpreted this EKG   Date: 07/28/2015   Rate: 67  Rhythm: normal sinus rhythm  QRS Axis: normal  Intervals: normal  ST/T Wave abnormalities: normal  Conduction Disutrbances:none  Narrative Interpretation:   Old EKG Reviewed: changes noted - no change from 05/2015   MDM   Final diagnoses:  Unstable angina (HCC)    The pt has normal VS other than htn, ECG without acute ishemia - sx started 3 hours ago - before eating - they are still present but gradually easing off - as best he can tell this is similar to prior MI / anginal pains.  Anticipate admit for r/o given high risk pt and ongoing sx - doesn't sound GI - no thypoxia or tachycardia to suggest PE and no rales to suggest edema.  Pt and family informed of plan and are in agreement  D/w Dr. Sharl Ma who will admit - the pt has neg trop, neg ECG - nitro gtt given for  ongoing pain. I am very concerned for his CP given his recent NSTEMI that was related to stress ischemia from UGI bleed but signifies likely underlying obstructive disease.  Pt agreeable to admission.  Medications  aspirin chewable tablet 324 mg (324 mg Oral Given 07/28/15 1526)  nitroGLYCERIN 50 mg in dextrose 5 % 250 mL (0.2 mg/mL) infusion (5 mcg/min Intravenous New Bag/Given 07/28/15 1527)    CRITICAL CARE Performed by: Eber Hong D Total critical care time: 35 minutes Critical care time was exclusive of separately billable procedures and treating other patients. Critical care was necessary to treat or prevent imminent or life-threatening deterioration. Critical care was time spent personally by me on the following activities: development of treatment plan with patient and/or surrogate as well as nursing, discussions with consultants, evaluation of patient's response to treatment, examination of patient, obtaining history from patient or surrogate, ordering and performing treatments and interventions, ordering and review of laboratory studies, ordering and review of radiographic studies, pulse oximetry and re-evaluation of patient's condition.     Eber Hong, MD 07/28/15 [phone]

## 2015-07-28 NOTE — H&P (Signed)
PCP:   Joycelyn Rua, MD   Chief Complaint:  Chest pain  HPI: 82 year old male who   has a past medical history of Hypertension; Hyperlipidemia; Diabetes mellitus, type II, insulin dependent (HCC); Coronary atherosclerosis of native coronary artery (2003); Morbid obesity (HCC); GERD (gastroesophageal reflux disease); History of tobacco abuse; Back pain; Hard of hearing; Aortic root dilatation (HCC); and CKD (chronic kidney disease) stage 4, GFR 15-29 ml/min (HCC). Today presents to the hospital with chief complaint of chest pain which started this morning around 11 AM. The pain had radiation to both arms and shoulders upper back. Patient has a history of chronic shortness of breath. Pain is easing off at this time. He received nitroglycerin IV in the ED. He denies any the symptoms, no recent upper respiratory infection. No cough. No fever. Denies dysuria urgency frequency of urination. Patient has a history of CAD status post CABG 10 years ago. He is followed by cardiology Dr. Antoine Poche. In the ED cardiac enzymes are negative.  Allergies:  No Known Allergies    Past Medical History  Diagnosis Date  . Hypertension   . Hyperlipidemia   . Diabetes mellitus, type II, insulin dependent (HCC)   . Coronary atherosclerosis of native coronary artery 2003    Multivessel s/p CABG  . Morbid obesity (HCC)   . GERD (gastroesophageal reflux disease)   . History of tobacco abuse   . Back pain   . Hard of hearing   . Aortic root dilatation (HCC)   . CKD (chronic kidney disease) stage 4, GFR 15-29 ml/min [facility])     Past Surgical History  Procedure Laterality Date  . Appendectomy    . Lumbar disc surgery      x2  . Coronary artery bypass graft  June 2003    LIMA to LAD, SVG to diagonal, sequential SVG to obtuse marginal and circumf SVG to RCAl  . Angioplasty    . Colonoscopy w/ polypectomy    . Skin cancer excision    . Hip arthroplasty Left 07/25/2013    Procedure: ARTHROPLASTY BIPOLAR  HIP;  Surgeon: Darreld Mclean, MD;  Location: [facility] ORS;  Service: Orthopedics;  Laterality: Left;  . Esophagogastroduodenoscopy N/A 06/07/2015    Procedure: ESOPHAGOGASTRODUODENOSCOPY (EGD);  Surgeon: West Bali, MD;  Location: [facility] ENDO SUITE;  Service: Endoscopy;  Laterality: N/A;  . Cardiac surgery      Prior to Admission medications   Medication Sig Start Date End Date Taking? Authorizing Provider  albuterol (PROVENTIL HFA;VENTOLIN HFA) 108 (90 BASE) MCG/ACT inhaler Inhale 2 puffs into the lungs every 4 (four) hours as needed for wheezing or shortness of breath.    Historical Provider, MD  atorvastatin (LIPITOR) 20 MG tablet TAKE 1 TABLET BY MOUTH AT BEDTIME FOR CHOLESTEROL. 02/15/14   Rollene Rotunda, MD  cefdinir (OMNICEF) 300 MG capsule  05/22/15   Historical Provider, MD  citalopram (CELEXA) 40 MG tablet Take 0.5 tablets (20 mg total) by mouth daily. 06/10/15   Hollice Espy, MD  furosemide (LASIX) 20 MG tablet Take 40 mg by mouth daily.     Historical Provider, MD  gabapentin (NEURONTIN) 100 MG capsule Take 100 mg by mouth 3 (three) times daily.    Historical Provider, MD  HYDROcodone-acetaminophen (NORCO/VICODIN) 5-325 MG per tablet Take 1-2 tablets by mouth every 6 (six) hours as needed for moderate pain. 07/30/13   Standley Brooking, MD  insulin detemir (LEVEMIR) 100 UNIT/ML injection Inject 0.25 mLs (25 Units total) into the skin daily.  Patient taking differently: Inject 25 Units into the skin daily as needed (for high sugar levels).  07/30/13   Standley Brookinganiel P Goodrich, MD  omeprazole (PRILOSEC OTC) 20 MG tablet 20mg  po bid x 3 months, then daily after 06/10/15   Hollice EspySendil K Krishnan, MD  pioglitazone (ACTOS) 45 MG tablet Take 45 mg by mouth daily.  05/22/15   Historical Provider, MD  potassium chloride (MICRO-K) 10 MEQ CR capsule Take 10 mEq by mouth daily.    Historical Provider, MD  SPIRIVA RESPIMAT 2.5 MCG/ACT AERS Inhale 1 puff into the lungs daily.  03/07/14   Historical Provider, MD     Social History:  reports that he quit smoking about 15 years ago. His smoking use included Cigarettes. He has a 60 pack-year smoking history. He has never used smokeless tobacco. He reports that he does not drink alcohol or use illicit drugs.  Family History  Problem Relation Age of Onset  . Heart disease Father   . Heart attack Child     Filed Weights   07/28/15 1416  Weight: 127.914 kg (282 lb)    All the positives are listed in BOLD  Review of Systems:  HEENT: Headache, blurred vision, runny nose, sore throat Neck: Hypothyroidism, hyperthyroidism,,lymphadenopathy Chest : Shortness of breath, history of COPD, Asthma Heart : Chest pain, history of coronary arterey disease GI:  Nausea, vomiting, diarrhea, constipation, GERD GU: Dysuria, urgency, frequency of urination, hematuria Neuro: Stroke, seizures, syncope Psych: Depression, anxiety, hallucinations   Physical Exam: Blood pressure 154/94, pulse 60, temperature 97.7 F (36.5 C), temperature source Oral, resp. rate 17, height 6' (1.829 m), weight 127.914 kg (282 lb), SpO2 98 %. Constitutional:   Patient is a well-developed and well-nourished male in no acute distress and cooperative with exam. Head: Normocephalic and atraumatic Mouth: Mucus membranes moist Eyes: PERRL, EOMI, conjunctivae normal Neck: Supple, No Thyromegaly Cardiovascular: RRR, S1 normal, S2 normal Pulmonary/Chest: CTAB, no wheezes, rales, or rhonchi Abdominal: Soft. Non-tender, non-distended, bowel sounds are normal, no masses, organomegaly, or guarding present.  Neurological: A&O x3, Strength is normal and symmetric bilaterally, cranial nerve II-XII are grossly intact, no focal motor deficit, sensory intact to light touch bilaterally.  Extremities : No Cyanosis, Clubbing. Trace edema in the lower extremities  Labs on Admission:  Basic Metabolic Panel:  Recent Labs Lab 07/28/15 1500  NA 135  K 5.1  CL 107  CO2 22  GLUCOSE 123*  BUN 36*   CREATININE 2.17*  CALCIUM 8.7*   Liver Function Tests:  Recent Labs Lab 07/28/15 1500  AST 25  ALT 18  ALKPHOS 85  BILITOT 0.7  PROT 7.1  ALBUMIN 3.5   CBC:  Recent Labs Lab 07/28/15 1500  WBC 7.2  NEUTROABS 4.0  HGB 11.4*  HCT 34.5*  MCV 93.5  PLT 157   Cardiac Enzymes:  Recent Labs Lab 07/28/15 1500  TROPONINI <0.03     Radiological Exams on Admission: Dg Chest Portable 1 View  07/28/2015  CLINICAL DATA:  Chest pain radiating to the shoulders and arms bilaterally. EXAM: PORTABLE CHEST 1 VIEW COMPARISON:  06/06/2015 FINDINGS: Previous median sternotomy and CABG. Chronic mild cardiac enlargement. Chronic atherosclerosis of the aorta. Pulmonary vascularity is normal. The lungs are clear. No effusions. Bony structures unremarkable. IMPRESSION: Previous CABG.  No evidence of heart failure. Electronically Signed   By: Paulina FusiMark  Shogry M.D.   On: 07/28/2015 15:22      Assessment/Plan Active Problems:   Diabetes (HCC)   CKD (chronic kidney disease) stage  4, GFR 15-29 ml/min (HCC)   Unstable angina (HCC)   Chest pain   Chest pain We'll admit the patient to rule out acute coronary syndrome Will obtain serial cardiac enzymes. Start Nitropaste half-inch every 6 hours  Diabetes mellitus Hold Actos, start sliding scale insulin with NovoLog   CAD, status post CABG Stable, continue Lipitor  DVT prophylaxis Lovenox  Code status: DO NOT RESUSCITATE  Family discussion: Admission, patients condition and plan of care including tests being ordered have been discussed with the patient and his wife at bedside who indicate understanding and agree with the plan and Code Status.   Time Spent on Admission: 60 minutes  Gay Moncivais S [facility] Pager: [phone] 07/28/2015, 4:53 PM  If 7PM-7AM, please contact night-coverage  [email or web address]  Password [email or web address]

## 2015-07-28 NOTE — ED Notes (Signed)
Patient c/o central chest pain that radiates into shoulders and arms bilaterally. Patient reports some shortness of breath and dizziness. Per wife patient has had 5 by-pass surgeries.

## 2015-07-28 NOTE — ED Notes (Signed)
Pt denies any chest pain.   C/o pain around umbilicus.

## 2015-07-28 NOTE — ED Notes (Signed)
Spoke with Dr. Sharl MaLama and notified that pt is on a ntg drip.  Orders to stop ntg drip and put on ntg paste.

## 2015-07-29 DIAGNOSIS — N179 Acute kidney failure, unspecified: Secondary | ICD-10-CM | POA: Diagnosis present

## 2015-07-29 DIAGNOSIS — I200 Unstable angina: Secondary | ICD-10-CM | POA: Diagnosis not present

## 2015-07-29 DIAGNOSIS — E669 Obesity, unspecified: Secondary | ICD-10-CM | POA: Diagnosis present

## 2015-07-29 DIAGNOSIS — Z96642 Presence of left artificial hip joint: Secondary | ICD-10-CM | POA: Diagnosis present

## 2015-07-29 DIAGNOSIS — J441 Chronic obstructive pulmonary disease with (acute) exacerbation: Secondary | ICD-10-CM | POA: Diagnosis present

## 2015-07-29 DIAGNOSIS — I25110 Atherosclerotic heart disease of native coronary artery with unstable angina pectoris: Secondary | ICD-10-CM | POA: Diagnosis present

## 2015-07-29 DIAGNOSIS — E1122 Type 2 diabetes mellitus with diabetic chronic kidney disease: Secondary | ICD-10-CM | POA: Diagnosis present

## 2015-07-29 DIAGNOSIS — Z87891 Personal history of nicotine dependence: Secondary | ICD-10-CM | POA: Diagnosis not present

## 2015-07-29 DIAGNOSIS — N184 Chronic kidney disease, stage 4 (severe): Secondary | ICD-10-CM | POA: Diagnosis not present

## 2015-07-29 DIAGNOSIS — Z66 Do not resuscitate: Secondary | ICD-10-CM | POA: Diagnosis present

## 2015-07-29 DIAGNOSIS — H9190 Unspecified hearing loss, unspecified ear: Secondary | ICD-10-CM | POA: Diagnosis present

## 2015-07-29 DIAGNOSIS — Z794 Long term (current) use of insulin: Secondary | ICD-10-CM | POA: Diagnosis not present

## 2015-07-29 DIAGNOSIS — I2510 Atherosclerotic heart disease of native coronary artery without angina pectoris: Secondary | ICD-10-CM | POA: Diagnosis not present

## 2015-07-29 DIAGNOSIS — R079 Chest pain, unspecified: Secondary | ICD-10-CM

## 2015-07-29 DIAGNOSIS — K219 Gastro-esophageal reflux disease without esophagitis: Secondary | ICD-10-CM | POA: Diagnosis present

## 2015-07-29 DIAGNOSIS — Z6838 Body mass index (BMI) 38.0-38.9, adult: Secondary | ICD-10-CM | POA: Diagnosis not present

## 2015-07-29 DIAGNOSIS — Z85828 Personal history of other malignant neoplasm of skin: Secondary | ICD-10-CM | POA: Diagnosis not present

## 2015-07-29 DIAGNOSIS — E785 Hyperlipidemia, unspecified: Secondary | ICD-10-CM | POA: Diagnosis not present

## 2015-07-29 DIAGNOSIS — E875 Hyperkalemia: Secondary | ICD-10-CM | POA: Diagnosis present

## 2015-07-29 DIAGNOSIS — D649 Anemia, unspecified: Secondary | ICD-10-CM | POA: Diagnosis present

## 2015-07-29 DIAGNOSIS — Z951 Presence of aortocoronary bypass graft: Secondary | ICD-10-CM | POA: Diagnosis not present

## 2015-07-29 DIAGNOSIS — I129 Hypertensive chronic kidney disease with stage 1 through stage 4 chronic kidney disease, or unspecified chronic kidney disease: Secondary | ICD-10-CM | POA: Diagnosis present

## 2015-07-29 DIAGNOSIS — E1165 Type 2 diabetes mellitus with hyperglycemia: Secondary | ICD-10-CM | POA: Diagnosis present

## 2015-07-29 DIAGNOSIS — R0602 Shortness of breath: Secondary | ICD-10-CM | POA: Diagnosis present

## 2015-07-29 DIAGNOSIS — Z8249 Family history of ischemic heart disease and other diseases of the circulatory system: Secondary | ICD-10-CM | POA: Diagnosis not present

## 2015-07-29 LAB — BASIC METABOLIC PANEL
Anion gap: 5 (ref 5–15)
BUN: 39 mg/dL — ABNORMAL HIGH (ref 6–20)
CHLORIDE: 107 mmol/L (ref 101–111)
CO2: 25 mmol/L (ref 22–32)
CREATININE: 2.01 mg/dL — AB (ref 0.61–1.24)
Calcium: 8.6 mg/dL — ABNORMAL LOW (ref 8.9–10.3)
GFR calc non Af Amer: 30 mL/min — ABNORMAL LOW (ref >60)
GFR, EST AFRICAN AMERICAN: 35 mL/min — AB (ref >60)
GLUCOSE: 149 mg/dL — AB (ref 65–99)
Potassium: 5.3 mmol/L — ABNORMAL HIGH (ref 3.5–5.1)
Sodium: 137 mmol/L (ref 135–145)

## 2015-07-29 LAB — CBC
HCT: 31.9 % — ABNORMAL LOW (ref 39.0–52.0)
HEMOGLOBIN: 10.3 g/dL — AB (ref 13.0–17.0)
MCH: 30.7 pg (ref 26.0–34.0)
MCHC: 32.3 g/dL (ref 30.0–36.0)
MCV: 95.2 fL (ref 78.0–100.0)
PLATELETS: 148 10*3/uL — AB (ref 150–400)
RBC: 3.35 MIL/uL — AB (ref 4.22–5.81)
RDW: 14.3 % (ref 11.5–15.5)
WBC: 6.9 10*3/uL (ref 4.0–10.5)

## 2015-07-29 LAB — INFLUENZA PANEL BY PCR (TYPE A & B)
H1N1FLUPCR: NOT DETECTED
INFLAPCR: NEGATIVE
INFLBPCR: NEGATIVE

## 2015-07-29 LAB — TROPONIN I
Troponin I: <0.03 ng/mL (ref <0.031)
Troponin I: <0.03 ng/mL (ref <0.031)

## 2015-07-29 LAB — GLUCOSE, CAPILLARY
GLUCOSE-CAPILLARY: 119 mg/dL — AB (ref 65–99)
GLUCOSE-CAPILLARY: 314 mg/dL — AB (ref 65–99)
Glucose-Capillary: 125 mg/dL — ABNORMAL HIGH (ref 65–99)
Glucose-Capillary: 154 mg/dL — ABNORMAL HIGH (ref 65–99)

## 2015-07-29 MED ORDER — SODIUM POLYSTYRENE SULFONATE 15 GM/60ML PO SUSP
30.0000 g | Freq: Once | ORAL | Status: AC
  Administered 2015-07-29: 30 g via ORAL
  Filled 2015-07-29: qty 120

## 2015-07-29 MED ORDER — IPRATROPIUM-ALBUTEROL 0.5-2.5 (3) MG/3ML IN SOLN
3.0000 mL | RESPIRATORY_TRACT | Status: DC
  Administered 2015-07-29 (×4): 3 mL via RESPIRATORY_TRACT
  Filled 2015-07-29 (×3): qty 3

## 2015-07-29 MED ORDER — INSULIN ASPART 100 UNIT/ML ~~LOC~~ SOLN
0.0000 [IU] | Freq: Every day | SUBCUTANEOUS | Status: DC
  Administered 2015-07-29 – 2015-07-30 (×2): 4 [IU] via SUBCUTANEOUS
  Administered 2015-07-31: 3 [IU] via SUBCUTANEOUS

## 2015-07-29 MED ORDER — METHYLPREDNISOLONE SODIUM SUCC 125 MG IJ SOLR
125.0000 mg | Freq: Once | INTRAMUSCULAR | Status: AC
  Administered 2015-07-29: 125 mg via INTRAVENOUS
  Filled 2015-07-29: qty 2

## 2015-07-29 MED ORDER — IPRATROPIUM-ALBUTEROL 0.5-2.5 (3) MG/3ML IN SOLN
3.0000 mL | Freq: Four times a day (QID) | RESPIRATORY_TRACT | Status: DC
  Administered 2015-07-30 – 2015-08-01 (×9): 3 mL via RESPIRATORY_TRACT
  Filled 2015-07-29 (×10): qty 3

## 2015-07-29 MED ORDER — ALBUTEROL SULFATE (2.5 MG/3ML) 0.083% IN NEBU: 2.5000 mg | INHALATION_SOLUTION | RESPIRATORY_TRACT | Status: DC | PRN

## 2015-07-29 MED ORDER — INSULIN ASPART 100 UNIT/ML ~~LOC~~ SOLN
0.0000 [IU] | Freq: Three times a day (TID) | SUBCUTANEOUS | Status: DC
  Administered 2015-07-30: 8 [IU] via SUBCUTANEOUS
  Administered 2015-07-30 (×2): 5 [IU] via SUBCUTANEOUS
  Administered 2015-07-30: 11 [IU] via SUBCUTANEOUS
  Administered 2015-07-31: 5 [IU] via SUBCUTANEOUS
  Administered 2015-07-31 (×2): 8 [IU] via SUBCUTANEOUS
  Administered 2015-08-01 (×2): 5 [IU] via SUBCUTANEOUS

## 2015-07-29 MED ORDER — METHYLPREDNISOLONE SODIUM SUCC 125 MG IJ SOLR
60.0000 mg | Freq: Two times a day (BID) | INTRAMUSCULAR | Status: DC
  Administered 2015-07-29 – 2015-08-01 (×6): 60 mg via INTRAVENOUS
  Filled 2015-07-29 (×6): qty 2

## 2015-07-29 MED ORDER — BUDESONIDE 0.25 MG/2ML IN SUSP
0.2500 mg | Freq: Two times a day (BID) | RESPIRATORY_TRACT | Status: DC
  Administered 2015-07-29 – 2015-08-01 (×6): 0.25 mg via RESPIRATORY_TRACT
  Filled 2015-07-29 (×8): qty 2

## 2015-07-29 MED ORDER — LEVOFLOXACIN IN D5W 500 MG/100ML IV SOLN
500.0000 mg | INTRAVENOUS | Status: DC
  Administered 2015-07-29 – 2015-07-30 (×2): 500 mg via INTRAVENOUS
  Filled 2015-07-29 (×2): qty 100

## 2015-07-29 NOTE — Consult Note (Signed)
Primary cardiologist: Dr Angelina SheriffJake Hochrein Consulting cardiologist: Dr Dina RichJonathan Denisse Whitenack Requesting physician: Dr Thad Rangeripudeep Rai CC: chest pain  Clinical Summary Mr. Hargan is a 82 y.o.male history of CAD with CABG 2003, atypical chest pain with [facility], HTN, obesity, hyperlipidemia, CKD 4, admitted with chest pain. Recent admit Jan 2017 with upper GI bleed secondary to antral ulcer, during that admit peak troponin to 2.4 thought to be demand ischemia. Echo at that time showed LVEF 65-70%, grade I diastoilc dysfunction.   Reports 2-3 days of dull pain in midchest mild to mod severity, can occur at rest or with exertion. Associated with SOB, no other symptoms. Not related to food, not positional. Lasts a few seconds, can happen multiple times throughout the day. Reports different from his prior chest pains.   Cr 2.17, BUN 36, Hgb 11.4, Plt  157, trop neg x 4 CXR no acute process EKG NSR, nonspec ST/T changes   No Known Allergies  Medications Scheduled Medications: . atorvastatin  20 mg Oral q1800  . budesonide (PULMICORT) nebulizer solution  0.25 mg Nebulization BID  . citalopram  20 mg Oral Daily  . enoxaparin (LOVENOX) injection  40 mg Subcutaneous Q24H  . furosemide  40 mg Oral Daily  . gabapentin  100 mg Oral TID  . insulin aspart  0-15 Units Subcutaneous TID WC  . insulin aspart  0-5 Units Subcutaneous QHS  . ipratropium-albuterol  3 mL Nebulization Q4H  . levofloxacin (LEVAQUIN) IV  500 mg Intravenous Q24H  . methylPREDNISolone (SOLU-MEDROL) injection  125 mg Intravenous Once  . methylPREDNISolone (SOLU-MEDROL) injection  60 mg Intravenous Q12H  . nitroGLYCERIN  0.5 inch Topical 4 times per day  . pantoprazole  40 mg Oral Daily  . sodium polystyrene  30 g Oral Once     Infusions: . sodium chloride 10 mL/hr at 07/28/15 1900     PRN Medications:  albuterol, ondansetron **OR** ondansetron (ZOFRAN) IV   Past Medical History    Diagnosis Date  . Hypertension   . Hyperlipidemia   . Diabetes mellitus, type II, insulin dependent (HCC)   . Coronary atherosclerosis of native coronary artery 2003    Multivessel s/p CABG  . Morbid obesity (HCC)   . GERD (gastroesophageal reflux disease)   . History of tobacco abuse   . Back pain   . Hard of hearing   . Aortic root dilatation (HCC)   . CKD (chronic kidney disease) stage 4, GFR 15-29 ml/min [facility](HCC)     Past Surgical History  Procedure Laterality Date  . Appendectomy    . Lumbar disc surgery      x2  . Coronary artery bypass graft  June 2003    LIMA to LAD, SVG to diagonal, sequential SVG to obtuse marginal and circumf SVG to RCAl  . Angioplasty    . Colonoscopy w/ polypectomy    . Skin cancer excision    . Hip arthroplasty Left 07/25/2013    Procedure: ARTHROPLASTY BIPOLAR HIP;  Surgeon: Darreld McleanWayne Keeling, MD;  Location: [facility] ORS;  Service: Orthopedics;  Laterality: Left;  . Esophagogastroduodenoscopy N/A 06/07/2015    Procedure: ESOPHAGOGASTRODUODENOSCOPY (EGD);  Surgeon: West BaliSandi L Fields, MD;  Location: [facility] END[facility] SUITE;  Service: Endoscopy;  Laterality: N/A;  . Cardiac surgery      Family History  Problem Relation Age of Onset  . Heart disease Father   . Heart attack Child     Social History Mr. Quesada reports that he quit  smoking about 15 years ago. His smoking use included Cigarettes. He has a 60 pack-year smoking history. He has never used smokeless tobacco. Mr. Sugg reports that he does not drink alcohol.  Review of Systems CONSTITUTIONAL: No weight loss, fever, chills, weakness or fatigue.  HEENT: Eyes: No visual loss, blurred vision, double vision or yellow sclerae. No hearing loss, sneezing, congestion, runny nose or sore throat.  SKIN: No rash or itching.  CARDIOVASCULAR: per HPI RESPIRATORY: No shortness of breath, cough or sputum.  GASTROINTESTINAL: No anorexia, nausea, vomiting or diarrhea. No abdominal pain or blood.  GENITOURINARY: no  polyuria, no dysuria NEUROLOGICAL: No headache, dizziness, syncope, paralysis, ataxia, numbness or tingling in the extremities. No change in bowel or bladder control.  MUSCULOSKELETAL: No muscle, back pain, joint pain or stiffness.  HEMATOLOGIC: No anemia, bleeding or bruising.  LYMPHATICS: No enlarged nodes. No history of splenectomy.  PSYCHIATRIC: No history of depression or anxiety.      Physical Examination Blood pressure 147/88, pulse 78, temperature 97.5 F (36.4 C), temperature source Oral, resp. rate 18, height 6' (1.829 m), weight 282 lb (127.914 kg), SpO2 97 %.  Intake/Output Summary (Last 24 hours) at 07/29/15 0959 Last data filed at 07/29/15 0800  Gross per 24 hour  Intake      0 ml  Output    650 ml  Net   -650 ml    HEENT: sclera clear, throat clear  Cardiovascular: RRR, no m/r/g, no jvd  Respiratory: CTAB  GI: abdomen soft, NT, ND  MSK: no LE edema.   Neuro: no focal deficits  Psych: appropriate affect   Lab Results  Basic Metabolic Panel:  Recent Labs Lab 07/28/15 1500 07/29/15 0639  NA 135 137  K 5.1 5.3*  CL 107 107  CO2 22 25  GLUCOSE 123* 149*  BUN 36* 39*  CREATININE 2.17* 2.01*  CALCIUM 8.7* 8.6*    Liver Function Tests:  Recent Labs Lab 07/28/15 1500  AST 25  ALT 18  ALKPHOS 85  BILITOT 0.7  PROT 7.1  ALBUMIN 3.5    CBC:  Recent Labs Lab 07/28/15 1500 07/29/15 0639  WBC 7.2 6.9  NEUTROABS 4.0  --   HGB 11.4* 10.3*  HCT 34.5* 31.9*  MCV 93.5 95.2  PLT 157 148*    Cardiac Enzymes:  Recent Labs Lab 07/28/15 1500 07/28/15 1858 07/29/15 0041 07/29/15 0639  TROPONINI <0.03 <0.03 <0.03 <0.03    BNP: Invalid input(s): POCBNP     Impression/Recommendations 1. Chest pain - known history of CAD with prior CABG. Long history of atypical chest pain, negative nuclear stress 06/2012. Recent admit Jan 2017 with GI bleed, trop to 2.5 managed medically. Continued episodes of chest pain over the last few days -  no evidence of ACS by enzymes or EKG - since 3 years since last stress will repeat lexiscan tomorrow, with his CKD and recent GI bleed would need to be high risk to consider invasive testing.    Dina Rich, M.D.

## 2015-07-29 NOTE — Progress Notes (Signed)
[facility]                                                                              Patient Demographics  [name], is a 82 y.o. male, DOB - 07-04-1933, ZOX:[id number]  Admit date - 07/28/2015   Admitting Physician Meredeth Ide, MD  Outpatient Primary MD for the patient is Joycelyn Rua, MD  LOS -    Chief Complaint  Patient presents with  . Chest Pain       Brief HPI   Patient is a 82 year old male with hypertension, hyperlipidemia, diabetes 2, insulin-dependent, CAD s/p 5VCABG, GERD presented with chief complaint of chest pain that started on the morning of admission with radiation to both arms, shoulder and upper back. Patient has a chronic shortness of breath.  patient was admitted for chest pain rule out     Assessment & Plan    Principal Problem:   Unstable angina [facility]): Patient reported chest pain with and without exertion in the last few days. He also has diffuse wheezing and chest tightness. -  nitroglycerin drip off, currently chest pain resolved  - Serial cardiac enzymes negative so far  - Cardiology consulted, keep NPO until cardiology eval  - Continue aspirin  Active Problems:   COPD exacerbation (HCC): Diffuse audible wheezing - Placed on scheduled bronchodilators, Pulmicort, IV Solu-Medrol, IV Levaquin - Flutter valve - Rule out influenza     Diabetes (HCC) -  increase sliding scale to moderate given Solu-Medrol started - Obtain hemoglobin A1c  Hyperkalemia - Hold potassium supplementation, given 1 dose of Kayexalate     Hyperlipidemia - Continue Lipitor     CKD (chronic kidney disease) stage 4, GFR 15-29 ml/min (HCC) - creatinine appears to be at baseline, follow closely with diuresis   Code Status: dnr  Family Communication: Discussed in detail with the patient, all imaging results, lab results explained to the patient  And wife, daughter and niece at bed side  Disposition Plan:   Time Spent in minutes   25  minutes  Procedures  None   Consults   cardiology   DVT Prophylaxis  Lovenox   Medications  Scheduled Meds: . atorvastatin  20 mg Oral q1800  . budesonide (PULMICORT) nebulizer solution  0.25 mg Nebulization BID  . citalopram  20 mg Oral Daily  . enoxaparin (LOVENOX) injection  40 mg Subcutaneous Q24H  . furosemide  40 mg Oral Daily  . gabapentin  100 mg Oral TID  . insulin aspart  0-9 Units Subcutaneous TID WC  . ipratropium-albuterol  3 mL Nebulization Q4H  . levofloxacin (LEVAQUIN) IV  500 mg Intravenous Q24H  . methylPREDNISolone (SOLU-MEDROL) injection  125 mg Intravenous Once  . methylPREDNISolone (SOLU-MEDROL) injection  60 mg Intravenous Q12H  . nitroGLYCERIN  0.5 inch Topical 4 times per day  . pantoprazole  40 mg Oral Daily  . potassium chloride  10 mEq Oral Daily   Continuous Infusions: . sodium chloride 10 mL/hr at 07/28/15 1900   PRN Meds:.albuterol, ondansetron **OR** ondansetron (ZOFRAN) IV   Antibiotics   Anti-infectives    Start     Dose/Rate Route Frequency  Ordered Stop   07/29/15 0800  levofloxacin (LEVAQUIN) IVPB 500 mg     500 mg 100 mL/hr over 60 Minutes Intravenous Every 24 hours 07/29/15 0750          Subjective:   [name] was seen and examined today. Chest pain currently resolved but audible wheezing. Patient denies dizziness, abdominal pain, N/V/D/C, new weakness, numbess, tingling. No acute events overnight.  No fevers  Objective:   Filed Vitals:   07/28/15 2037 07/28/15 2120 07/29/15 0539 07/29/15 0818  BP:  135/76 147/88   Pulse:  63 78   Temp:  97.8 F (36.6 C) 97.5 F (36.4 C)   TempSrc:  Oral Oral   Resp:  18 18   Height:      Weight:      SpO2: 97% 96% 97% 97%    Intake/Output Summary (Last 24 hours) at 07/29/15 0921 Last data filed at 07/29/15 0800  Gross per 24 hour  Intake      0 ml  Output    650 ml  Net   -650 ml     Wt Readings from Last 3 Encounters:  07/28/15 127.914 kg (282 lb)  06/11/15  129.048 kg (284 lb 8 oz)  03/21/14 122.018 kg (269 lb)     Exam  General: Alert and oriented x 3, NAD, speaking in full sentences  HEENT:  PERRLA, EOMI, Anicteric Sclera, mucous membranes moist.   Neck: Supple, no JVD, no masses  CVS: S1 S2 auscultated, no rubs, murmurs or gallops. Regular rate and rhythm.  Respiratory: diffuse audible wheezing   Abdomen: Soft, nontender, nondistended, + bowel sounds  Ext: no cyanosis clubbing, trace edema  Neuro: AAOx3, Cr N's II- XII. Strength 5/5 upper and lower extremities bilaterally  Skin: No rashes  Psych: Normal affect and demeanor, alert and oriented x3    Data Review   Micro Results No results found for this or any previous visit (from the past 240 hour(s)).  Radiology Reports Dg Chest Portable 1 View  07/28/2015  CLINICAL DATA:  Chest pain radiating to the shoulders and arms bilaterally. EXAM: PORTABLE CHEST 1 VIEW COMPARISON:  06/06/2015 FINDINGS: Previous median sternotomy and CABG. Chronic mild cardiac enlargement. Chronic atherosclerosis of the aorta. Pulmonary vascularity is normal. The lungs are clear. No effusions. Bony structures unremarkable. IMPRESSION: Previous CABG.  No evidence of heart failure. Electronically Signed   By: Paulina Fusi M.D.   On: 07/28/2015 15:22    CBC  Recent Labs Lab 07/28/15 1500 07/29/15 0639  WBC 7.2 6.9  HGB 11.4* 10.3*  HCT 34.5* 31.9*  PLT 157 148*  MCV 93.5 95.2  MCH 30.9 30.7  MCHC 33.0 32.3  RDW 14.2 14.3  LYMPHSABS 2.1  --   MONOABS 0.8  --   EOSABS 0.3  --   BASOSABS 0.0  --     Chemistries   Recent Labs Lab 07/28/15 1500 07/29/15 0639  NA 135 137  K 5.1 5.3*  CL 107 107  CO2 22 25  GLUCOSE 123* 149*  BUN 36* 39*  CREATININE 2.17* 2.01*  CALCIUM 8.7* 8.6*  AST 25  --   ALT 18  --   ALKPHOS 85  --   BILITOT 0.7  --    ------------------------------------------------------------------------------------------------------------------ estimated creatinine  clearance is 41.2 mL/min (by C-G formula based on Cr of 2.01). ------------------------------------------------------------------------------------------------------------------ No results for input(s): HGBA1C in the last 72 hours. ------------------------------------------------------------------------------------------------------------------ No results for input(s): CHOL, HDL, LDLCALC, TRIG, CHOLHDL, LDLDIRECT in the last  72 hours. ------------------------------------------------------------------------------------------------------------------ No results for input(s): TSH, T4TOTAL, T3FREE, THYROIDAB in the last 72 hours.  Invalid input(s): FREET3 ------------------------------------------------------------------------------------------------------------------ No results for input(s): VITAMINB12, FOLATE, FERRITIN, TIBC, IRON, RETICCTPCT in the last 72 hours.  Coagulation profile No results for input(s): INR, PROTIME in the last 168 hours.  No results for input(s): DDIMER in the last 72 hours.  Cardiac Enzymes  Recent Labs Lab 07/28/15 1858 07/29/15 0041 07/29/15 0639  TROPONINI <0.03 <0.03 <0.03   ------------------------------------------------------------------------------------------------------------------ Invalid input(s): POCBNP   Recent Labs  07/28/15 2037 07/29/15 0809  GLUCAP 160* 125*     Steffen Hase M.D. [facility] 07/29/2015, 9:21 AM  Pager: [phone] Between 7am to 7pm - call Pager - [phone]  After 7pm go to [email or web address] - password [email or web address]  Call night coverage person covering after 7pm

## 2015-07-30 ENCOUNTER — Encounter (HOSPITAL_COMMUNITY): Payer: Self-pay

## 2015-07-30 ENCOUNTER — Inpatient Hospital Stay (HOSPITAL_COMMUNITY): Payer: Medicare Other

## 2015-07-30 DIAGNOSIS — R079 Chest pain, unspecified: Secondary | ICD-10-CM

## 2015-07-30 LAB — NM MYOCAR MULTI W/SPECT W/WALL MOTION / EF
CHL CUP NUCLEAR SRS: 8
CHL CUP NUCLEAR SSS: 12
CSEPPHR: 110 {beats}/min
LVDIAVOL: 82 mL
LVSYSVOL: 26 mL
NUC STRESS TID: 1.31
RATE: 0.47
Rest HR: 96 {beats}/min
SDS: 6

## 2015-07-30 LAB — GLUCOSE, CAPILLARY
GLUCOSE-CAPILLARY: 267 mg/dL — AB (ref 65–99)
GLUCOSE-CAPILLARY: 312 mg/dL — AB (ref 65–99)
Glucose-Capillary: 214 mg/dL — ABNORMAL HIGH (ref 65–99)
Glucose-Capillary: 313 mg/dL — ABNORMAL HIGH (ref 65–99)

## 2015-07-30 LAB — HEMOGLOBIN A1C
Hgb A1c MFr Bld: 6.6 % — ABNORMAL HIGH (ref 4.8–5.6)
Hgb A1c MFr Bld: 6.5 % — ABNORMAL HIGH (ref 4.8–5.6)
MEAN PLASMA GLUCOSE: 143 mg/dL
Mean Plasma Glucose: 140 mg/dL

## 2015-07-30 LAB — BASIC METABOLIC PANEL
Anion gap: 9 (ref 5–15)
BUN: 61 mg/dL — AB (ref 6–20)
CHLORIDE: 107 mmol/L (ref 101–111)
CO2: 21 mmol/L — AB (ref 22–32)
Calcium: 8.7 mg/dL — ABNORMAL LOW (ref 8.9–10.3)
Creatinine, Ser: 2.71 mg/dL — ABNORMAL HIGH (ref 0.61–1.24)
GFR calc Af Amer: 24 mL/min — ABNORMAL LOW (ref >60)
GFR calc non Af Amer: 21 mL/min — ABNORMAL LOW (ref >60)
Glucose, Bld: 254 mg/dL — ABNORMAL HIGH (ref 65–99)
POTASSIUM: 5.1 mmol/L (ref 3.5–5.1)
SODIUM: 137 mmol/L (ref 135–145)

## 2015-07-30 MED ORDER — METOPROLOL TARTRATE 25 MG PO TABS
12.5000 mg | ORAL_TABLET | Freq: Two times a day (BID) | ORAL | Status: DC
  Administered 2015-07-30 – 2015-08-01 (×5): 12.5 mg via ORAL
  Filled 2015-07-30 (×5): qty 1

## 2015-07-30 MED ORDER — REGADENOSON 0.4 MG/5ML IV SOLN
INTRAVENOUS | Status: AC
  Administered 2015-07-30: 0.4 mg via INTRAVENOUS
  Filled 2015-07-30: qty 5

## 2015-07-30 MED ORDER — SODIUM CHLORIDE 0.9% FLUSH
INTRAVENOUS | Status: AC
  Administered 2015-07-30: 10 mL via INTRAVENOUS
  Filled 2015-07-30: qty 10

## 2015-07-30 MED ORDER — TECHNETIUM TC 99M SESTAMIBI GENERIC - CARDIOLITE
30.0000 | Freq: Once | INTRAVENOUS | Status: AC | PRN
  Administered 2015-07-30: 28 via INTRAVENOUS

## 2015-07-30 MED ORDER — TECHNETIUM TC 99M SESTAMIBI - CARDIOLITE
10.0000 | Freq: Once | INTRAVENOUS | Status: AC | PRN
  Administered 2015-07-30: 07:00:00 9 via INTRAVENOUS

## 2015-07-30 MED ORDER — LEVOFLOXACIN IN D5W 500 MG/100ML IV SOLN
500.0000 mg | INTRAVENOUS | Status: DC
  Administered 2015-08-01: 500 mg via INTRAVENOUS
  Filled 2015-07-30: qty 100

## 2015-07-30 MED ORDER — INSULIN DETEMIR 100 UNIT/ML ~~LOC~~ SOLN
15.0000 [IU] | Freq: Every day | SUBCUTANEOUS | Status: DC
  Administered 2015-07-30 – 2015-08-01 (×3): 15 [IU] via SUBCUTANEOUS
  Filled 2015-07-30 (×4): qty 0.15

## 2015-07-30 NOTE — Progress Notes (Signed)
Inpatient Diabetes Program Recommendations  AACE/ADA: New Consensus Statement on Inpatient Glycemic Control (2015)  Target Ranges:  Prepandial:   less than 140 mg/dL      Peak postprandial:   less than 180 mg/dL (1-2 hours)      Critically ill patients:  140 - 180 mg/dL   Results for Patsey BertholdSOUTHERN, Beckham B (MRN [id number]) as of 07/30/2015 09:44  Ref. Range 07/29/2015 08:09 07/29/2015 11:21 07/29/2015 16:08 07/29/2015 21:11  Glucose-Capillary Latest Ref Range: 65-99 mg/dL 010125 (H) 272119 (H) 536154 (H) 314 (H)   Results for Patsey BertholdSOUTHERN, Zyshonne B (MRN [id number]) as of 07/30/2015 09:44  Ref. Range 07/30/2015 09:21  Glucose-Capillary Latest Ref Range: 65-99 mg/dL 595267 (H)   Results for Patsey BertholdSOUTHERN, Pasqual B (MRN [id number]) as of 07/30/2015 09:44  Ref. Range 07/29/2015 06:39  Hemoglobin A1C Latest Ref Range: 4.8-5.6 % 6.5 (H)    Admit with: CP  History: DM,CKD, CABG  Home DM Meds: Levemir 30 units daily       Actos 45 mg daily  Current Insulin Orders: Novolog Moderate Correction Scale/ SSI (0-15 units) TID AC + HS     MD- Elevated glucose this AM (CBG 267 mg/dl).  Patient takes Levemir at home.  Please consider starting 50% patient's home dose of Levemir.  Levemir 15 units daily.     --Will follow patient during hospitalization--  Ambrose FinlandJeannine Johnston Ursula Dermody RN, MSN, CDE Diabetes Coordinator Inpatient Glycemic Control Team Team Pager: [phone] (8a-5p)

## 2015-07-30 NOTE — Progress Notes (Addendum)
Primary Cardiologist: Hochrein,   Cardiology Specific Problem List: 1. Chest Pain 2. CAD with hx of CABG 3. Hypertension  Subjective:   Patient examined in the stress lab prior to Lexiscan stress test. No complaints of chest pain.    Objective:   Temp:  [98 F (36.7 C)-98.9 F (37.2 C)] 98.2 F (36.8 C) (03/07 0500) Pulse Rate:  [96-114] 100 (03/07 0500) Resp:  [20-30] 20 (03/06 2113) BP: (117-142)/(47-88) 117/66 mmHg (03/07 0500) SpO2:  [91 %-97 %] 96 % (03/07 0500) Last BM Date: 07/28/15  Filed Weights   07/28/15 1416  Weight: 282 lb (127.914 kg)    Intake/Output Summary (Last 24 hours) at 07/30/15 0859 Last data filed at 07/30/15 0800  Gross per 24 hour  Intake    240 ml  Output   1200 ml  Net   -960 ml    Telemetry: NSR rates 90 bpm  Exam:  General: No acute distress.Pale  HEENT: Conjunctiva and lids normal, oropharynx clear.  Lungs: Clear to auscultation, nonlabored.  Cardiac: No elevated JVP or bruits. RRR, tachycardic 1/6 systolic murmur, no gallop or rub.   Abdomen: Normoactive bowel sounds, nontender, nondistended.  Extremities: No pitting edema, distal pulses full.  Neuropsychiatric: Alert and oriented x3, affect appropriate.Slow verbal response.    Lab Results:  Basic Metabolic Panel:  Recent Labs Lab 07/28/15 1500 07/29/15 0639 07/30/15 0618  NA 135 137 137  K 5.1 5.3* 5.1  CL 107 107 107  CO2 22 25 21*  GLUCOSE 123* 149* 254*  BUN 36* 39* 61*  CREATININE 2.17* 2.01* 2.71*  CALCIUM 8.7* 8.6* 8.7*    Liver Function Tests:  Recent Labs Lab 07/28/15 1500  AST 25  ALT 18  ALKPHOS 85  BILITOT 0.7  PROT 7.1  ALBUMIN 3.5    CBC:  Recent Labs Lab 07/28/15 1500 07/29/15 0639  WBC 7.2 6.9  HGB 11.4* 10.3*  HCT 34.5* 31.9*  MCV 93.5 95.2  PLT 157 148*    Cardiac Enzymes:  Recent Labs Lab 07/28/15 1858 07/29/15 0041 07/29/15 0639  TROPONINI <0.03 <0.03 <0.03    Radiology: Dg Chest Portable 1  View  07/28/2015  CLINICAL DATA:  Chest pain radiating to the shoulders and arms bilaterally. EXAM: PORTABLE CHEST 1 VIEW COMPARISON:  06/06/2015 FINDINGS: Previous median sternotomy and CABG. Chronic mild cardiac enlargement. Chronic atherosclerosis of the aorta. Pulmonary vascularity is normal. The lungs are clear. No effusions. Bony structures unremarkable. IMPRESSION: Previous CABG.  No evidence of heart failure. Electronically Signed   By: Paulina Fusi M.D.   On: 07/28/2015 15:22     Medications:   Scheduled Medications: . atorvastatin  20 mg Oral q1800  . budesonide (PULMICORT) nebulizer solution  0.25 mg Nebulization BID  . citalopram  20 mg Oral Daily  . enoxaparin (LOVENOX) injection  40 mg Subcutaneous Q24H  . gabapentin  100 mg Oral TID  . insulin aspart  0-15 Units Subcutaneous TID WC  . insulin aspart  0-5 Units Subcutaneous QHS  . ipratropium-albuterol  3 mL Nebulization Q6H  . levofloxacin (LEVAQUIN) IV  500 mg Intravenous Q24H  . methylPREDNISolone (SOLU-MEDROL) injection  60 mg Intravenous Q12H  . nitroGLYCERIN  0.5 inch Topical 4 times per day  . pantoprazole  40 mg Oral Daily    Infusions: . sodium chloride 10 mL/hr at 07/28/15 1900    PRN Medications: albuterol, ondansetron **OR** ondansetron (ZOFRAN) IV, technetium sestamibi generic   Assessment and Plan:   1. Chest  Pain: Denies any chest pain overnight. He is overall fatigued and has slow response to questions. Lexiscan stress test will be completed this am with results assisting in further medical management.   2. CAD-S/P CABG: Troponin is negative X 4. May be related to IV steroids. HR is tachycardic at rest Add low dose lopressor 12.5 mg BID. No ASA with hx of GI bleed. Continue statin.   3. Hypertension.: Not on ACE or ARB due to #4. Can consider adding low dose amlodipine for BP control. Will evaluate response to low dose BB for HR control before adding an additional agent.   4.CKD: Stage 4: Creatinine  is rising from 2.01 to 2.71 this am. BUN up from 39 to 61.   5. Anemia: With hx of GIB would hemoccult stools. Hgb down on gram overnight. This may also be contributing to elevate HR. BUN is rising as well. Consider GI consult.   Bettey MareKathryn M. Lawrence NP [facility]  07/30/2015, 8:59 AM   Attending Note Patient seen and discussed with NP Lyman BishopLawrence, I agree with her documentation above. We will f/u on Lexiscan results regarding his chest pain. Given his poor renal function and recent GI bleed would have to significant risk to be considered for invasive testing. No evidence of ACS by enzymes or EKG. AKI per primary team, with pattern of BUN/Cr and Cr consider hypovolemia/prerenal   Dominga FerryJ Lochlann Mastrangelo MD   07/30/15 1041 AM addendum Stress test reviewed. Inferoseptal infarct with mild to moderate peri-infarct ischemia, low to intermediate risk study. Based on advanced kidney disease with stage IV CKD along with recent GI bleed due to ulcer, risks benefit assessment favors medical therapy at this time. He was not on any antianginals at home, started on lopressor today. Follow symptoms and bp, follow COPD symptoms on beta blocker. No ASA due to recent GI bleed.   Dominga FerryJ Navi Erber MD

## 2015-07-30 NOTE — Progress Notes (Signed)
[facility]                                                                              Patient Demographics  [name], is a 82 y.o. male, DOB - 05-30-1933, NWG:[id number]  Admit date - 07/28/2015   Admitting Physician Meredeth Ide, MD  Outpatient Primary MD for the patient is Joycelyn Rua, MD  LOS -    Chief Complaint  Patient presents with  . Chest Pain       Brief HPI   Patient is a 82 year old male with hypertension, hyperlipidemia, diabetes 2, insulin-dependent, CAD s/p 5VCABG, GERD presented with chief complaint of chest pain that started on the morning of admission with radiation to both arms, shoulder and upper back. Patient has a chronic shortness of breath.  patient was admitted for chest pain rule out     Assessment & Plan    Principal Problem:   Unstable angina [facility]): Patient reported chest pain with and without exertion in the last few days. He also has diffuse wheezing and chest tightness. -  Patient was placed on nitroglycerin drip, currently off, Serial cardiac enzymes negative so far  - nuclear medicine stress test today-> EF 65%, prior myocardial infarction with peri-infarct ischemia in the inferior septal wall, mild to moderate peri-infarct ischemia low to moderate risk test-> follow cardiology recommendations - Continue aspirin, started on Lopressor today   Active Problems:   COPD exacerbation (HCC): ruled out influenza   wheezing is significantly improving today  - Placed on scheduled bronchodilators, Pulmicort, IV Solu-Medrol, IV Levaquin - Flutter valve - transition to oral prednisone in a.m.     Diabetes (HCC) with hyperglycemia  -  increase sliding scale to moderate given Solu-Medrol started - hemoglobin A1c 6.5  - Added Levemir   Hyperkalemia - Hold potassium supplementation, given 1 dose of Kayexalate     Hyperlipidemia - Continue Lipitor     CKD (chronic kidney disease) stage 4, GFR 15-29 ml/min (HCC) -   holding Lasix today, has a chronic kidney disease stage IV   Code Status: dnr  Family Communication: Discussed in detail with the patient, all imaging results, lab results explained to the patient  And wife, daughter at bed side  Disposition Plan:  hopefully DC tomorrow if stable  Time Spent in minutes   25 minutes  Procedures  None   Consults   cardiology   DVT Prophylaxis  Lovenox   Medications  Scheduled Meds: . atorvastatin  20 mg Oral q1800  . budesonide (PULMICORT) nebulizer solution  0.25 mg Nebulization BID  . citalopram  20 mg Oral Daily  . enoxaparin (LOVENOX) injection  40 mg Subcutaneous Q24H  . gabapentin  100 mg Oral TID  . insulin aspart  0-15 Units Subcutaneous TID WC  . insulin aspart  0-5 Units Subcutaneous QHS  . insulin detemir  15 Units Subcutaneous Daily  . ipratropium-albuterol  3 mL Nebulization Q6H  . [START ON 08/01/2015] levofloxacin (LEVAQUIN) IV  500 mg Intravenous Q48H  . methylPREDNISolone (SOLU-MEDROL) injection  60 mg Intravenous Q12H  . metoprolol tartrate  12.5 mg Oral BID  . nitroGLYCERIN  0.5 inch Topical 4 times per day  . pantoprazole  40 mg Oral Daily   Continuous Infusions: . sodium chloride 10 mL/hr at 07/28/15 1900   PRN Meds:.albuterol, ondansetron **OR** ondansetron (ZOFRAN) IV   Antibiotics   Anti-infectives    Start     Dose/Rate Route Frequency Ordered Stop   08/01/15 1000  levofloxacin (LEVAQUIN) IVPB 500 mg     500 mg 100 mL/hr over 60 Minutes Intravenous Every 48 hours 07/30/15 1337     07/29/15 0800  levofloxacin (LEVAQUIN) IVPB 500 mg  Status:  Discontinued     500 mg 100 mL/hr over 60 Minutes Intravenous Every 24 hours 07/29/15 0750 07/30/15 1337        Subjective:   [name] was seen and examined today.  chest pain resolved, wheezing is improving. No fevers or chills.  states had chest pain and dizziness during the stress test.Patient denies abdominal pain, N/V/D/C, new weakness, numbess, tingling.  No acute events overnight.   Objective:   Filed Vitals:   07/29/15 2113 07/30/15 0159 07/30/15 0500 07/30/15 1347  BP: 142/88  117/66   Pulse: 114  100   Temp: 98.9 F (37.2 C)  98.2 F (36.8 C)   TempSrc: Oral  Oral   Resp: 20     Height:      Weight:      SpO2: 96% 91% 96% 93%    Intake/Output Summary (Last 24 hours) at 07/30/15 1508 Last data filed at 07/30/15 1200  Gross per 24 hour  Intake    240 ml  Output    450 ml  Net   -210 ml     Wt Readings from Last 3 Encounters:  07/28/15 127.914 kg (282 lb)  06/11/15 129.048 kg (284 lb 8 oz)  03/21/14 122.018 kg (269 lb)     Exam  General: Alert and oriented x 3, NAD, speaking in full sentences  HEENT:  PERRLA, EOMI, Anicteric Sclera, mucous membranes moist.   Neck: Supple, no JVD, no masses  CVS: S1 S2 auscultated, no rubs, murmurs or gallops. Regular rate and rhythm.  Respiratory:  wheezing improving  Abdomen: Soft, nontender, nondistended, + bowel sounds  Ext: no cyanosis clubbing, trace edema  Neuro: no new deficits   Skin: No rashes  Psych: Normal affect and demeanor, alert and oriented x3    Data Review   Micro Results No results found for this or any previous visit (from the past 240 hour(s)).  Radiology Reports Nm Myocar Multi W/spect W/wall Motion / Ef  07/30/2015   There was no ST segment deviation noted during stress.  Findings consistent with prior myocardial infarction with peri-infarct ischemia in the inferoseptal wall. There is mild to moderate peri-infarct ischemia.  The left ventricular ejection fraction is hyperdynamic (>65%).  This is a low to intermediate risk test. There is a mild to moderate amount of myocardium at jeopardy in the inferoseptal wall.    Dg Chest Portable 1 View  07/28/2015  CLINICAL DATA:  Chest pain radiating to the shoulders and arms bilaterally. EXAM: PORTABLE CHEST 1 VIEW COMPARISON:  06/06/2015 FINDINGS: Previous median sternotomy and CABG. Chronic mild  cardiac enlargement. Chronic atherosclerosis of the aorta. Pulmonary vascularity is normal. The lungs are clear. No effusions. Bony structures unremarkable. IMPRESSION: Previous CABG.  No evidence of heart failure. Electronically Signed   By: Paulina Fusi M.D.   On: 07/28/2015 15:22    CBC  Recent Labs Lab 07/28/15 1500 07/29/15 0639  WBC 7.2  6.9  HGB 11.4* 10.3*  HCT 34.5* 31.9*  PLT 157 148*  MCV 93.5 95.2  MCH 30.9 30.7  MCHC 33.0 32.3  RDW 14.2 14.3  LYMPHSABS 2.1  --   MONOABS 0.8  --   EOSABS 0.3  --   BASOSABS 0.0  --     Chemistries   Recent Labs Lab 07/28/15 1500 07/29/15 0639 07/30/15 0618  NA 135 137 137  K 5.1 5.3* 5.1  CL 107 107 107  CO2 22 25 21*  GLUCOSE 123* 149* 254*  BUN 36* 39* 61*  CREATININE 2.17* 2.01* 2.71*  CALCIUM 8.7* 8.6* 8.7*  AST 25  --   --   ALT 18  --   --   ALKPHOS 85  --   --   BILITOT 0.7  --   --    ------------------------------------------------------------------------------------------------------------------ estimated creatinine clearance is 30.5 mL/min (by C-G formula based on Cr of 2.71). ------------------------------------------------------------------------------------------------------------------  Recent Labs  07/28/15 1858 07/29/15 0639  HGBA1C 6.6* 6.5*   ------------------------------------------------------------------------------------------------------------------ No results for input(s): CHOL, HDL, LDLCALC, TRIG, CHOLHDL, LDLDIRECT in the last 72 hours. ------------------------------------------------------------------------------------------------------------------ No results for input(s): TSH, T4TOTAL, T3FREE, THYROIDAB in the last 72 hours.  Invalid input(s): FREET3 ------------------------------------------------------------------------------------------------------------------ No results for input(s): VITAMINB12, FOLATE, FERRITIN, TIBC, IRON, RETICCTPCT in the last 72 hours.  Coagulation  profile No results for input(s): INR, PROTIME in the last 168 hours.  No results for input(s): DDIMER in the last 72 hours.  Cardiac Enzymes  Recent Labs Lab 07/28/15 1858 07/29/15 0041 07/29/15 0639  TROPONINI <0.03 <0.03 <0.03   ------------------------------------------------------------------------------------------------------------------ Invalid input(s): POCBNP   Recent Labs  07/29/15 0809 07/29/15 1121 07/29/15 1608 07/29/15 2111 07/30/15 0921 07/30/15 1106  GLUCAP 125* 119* 154* 314* 267* 312*     RAI,RIPUDEEP M.D. [facility] 07/30/2015, 3:08 PM  Pager: [phone] Between 7am to 7pm - call Pager - [phone]  After 7pm go to [email or web address] - password [email or web address]  Call night coverage person covering after 7pm

## 2015-07-31 DIAGNOSIS — E785 Hyperlipidemia, unspecified: Secondary | ICD-10-CM

## 2015-07-31 DIAGNOSIS — J441 Chronic obstructive pulmonary disease with (acute) exacerbation: Principal | ICD-10-CM

## 2015-07-31 DIAGNOSIS — N184 Chronic kidney disease, stage 4 (severe): Secondary | ICD-10-CM

## 2015-07-31 LAB — BASIC METABOLIC PANEL
Anion gap: 8 (ref 5–15)
BUN: 79 mg/dL — AB (ref 6–20)
CALCIUM: 8.5 mg/dL — AB (ref 8.9–10.3)
CO2: 20 mmol/L — ABNORMAL LOW (ref 22–32)
CREATININE: 3.18 mg/dL — AB (ref 0.61–1.24)
Chloride: 105 mmol/L (ref 101–111)
GFR calc Af Amer: 20 mL/min — ABNORMAL LOW (ref >60)
GFR, EST NON AFRICAN AMERICAN: 17 mL/min — AB (ref >60)
Glucose, Bld: 244 mg/dL — ABNORMAL HIGH (ref 65–99)
Potassium: 5 mmol/L (ref 3.5–5.1)
SODIUM: 133 mmol/L — AB (ref 135–145)

## 2015-07-31 LAB — GLUCOSE, CAPILLARY
GLUCOSE-CAPILLARY: 219 mg/dL — AB (ref 65–99)
GLUCOSE-CAPILLARY: 260 mg/dL — AB (ref 65–99)
Glucose-Capillary: 287 mg/dL — ABNORMAL HIGH (ref 65–99)
Glucose-Capillary: 258 mg/dL — ABNORMAL HIGH (ref 65–99)

## 2015-07-31 MED ORDER — ENOXAPARIN SODIUM 30 MG/0.3ML ~~LOC~~ SOLN
30.0000 mg | SUBCUTANEOUS | Status: DC
  Administered 2015-07-31: 30 mg via SUBCUTANEOUS
  Filled 2015-07-31: qty 0.3

## 2015-07-31 MED ORDER — SODIUM CHLORIDE 0.9 % IV SOLN
INTRAVENOUS | Status: DC
  Administered 2015-07-31 – 2015-08-01 (×2): via INTRAVENOUS

## 2015-07-31 MED ORDER — ATORVASTATIN CALCIUM 40 MG PO TABS
80.0000 mg | ORAL_TABLET | Freq: Every day | ORAL | Status: DC
  Administered 2015-07-31: 80 mg via ORAL
  Filled 2015-07-31: qty 2

## 2015-07-31 NOTE — Progress Notes (Signed)
Patient ID: [name], male   DOB: February 26, 1934, 82 y.o.   MRN: [id number]      Subjective:   No chest pain, no SOB   Objective:   Temp:  [98 F (36.7 C)-98.3 F (36.8 C)] 98 F (36.7 C) (03/08 0506) Pulse Rate:  [83-90] 83 (03/08 0506) Resp:  [20] 20 (03/08 0506) BP: (115-116)/(53-57) 115/57 mmHg (03/08 0506) SpO2:  [92 %-95 %] 93 % (03/08 0849) Last BM Date: 07/28/15  Filed Weights   07/28/15 1416  Weight: 282 lb (127.914 kg)    Intake/Output Summary (Last 24 hours) at 07/31/15 0913 Last data filed at 07/31/15 0851  Gross per 24 hour  Intake    720 ml  Output   2100 ml  Net  -1380 ml    Telemetry: NSR  Exam:  General: NAD  HEENT: sclera clear, throat clear  Resp: CTAB  Cardiac: RRR, no m/r/g, no jvd  GI: abdmoen soft, NT, ND  MSK: no LE edema  Neuro: no focal deficits  Psych: appropriate affect  Lab Results:  Basic Metabolic Panel:  Recent Labs Lab 07/29/15 0639 07/30/15 0618 07/31/15 0611  NA 137 137 133*  K 5.3* 5.1 5.0  CL 107 107 105  CO2 25 21* 20*  GLUCOSE 149* 254* 244*  BUN 39* 61* 79*  CREATININE 2.01* 2.71* 3.18*  CALCIUM 8.6* 8.7* 8.5*    Liver Function Tests:  Recent Labs Lab 07/28/15 1500  AST 25  ALT 18  ALKPHOS 85  BILITOT 0.7  PROT 7.1  ALBUMIN 3.5    CBC:  Recent Labs Lab 07/28/15 1500 07/29/15 0639  WBC 7.2 6.9  HGB 11.4* 10.3*  HCT 34.5* 31.9*  MCV 93.5 95.2  PLT 157 148*    Cardiac Enzymes:  Recent Labs Lab 07/28/15 1858 07/29/15 0041 07/29/15 0639  TROPONINI <0.03 <0.03 <0.03    BNP: No results for input(s): PROBNP in the last 8760 hours.  Coagulation: No results for input(s): INR in the last 168 hours.  ECG:   Medications:   Scheduled Medications: . atorvastatin  20 mg Oral q1800  . budesonide (PULMICORT) nebulizer solution  0.25 mg Nebulization BID  . citalopram  20 mg Oral Daily  . enoxaparin (LOVENOX) injection  40 mg Subcutaneous Q24H  . gabapentin  100 mg Oral  TID  . insulin aspart  0-15 Units Subcutaneous TID WC  . insulin aspart  0-5 Units Subcutaneous QHS  . insulin detemir  15 Units Subcutaneous Daily  . ipratropium-albuterol  3 mL Nebulization Q6H  . [START ON 08/01/2015] levofloxacin (LEVAQUIN) IV  500 mg Intravenous Q48H  . methylPREDNISolone (SOLU-MEDROL) injection  60 mg Intravenous Q12H  . metoprolol tartrate  12.5 mg Oral BID  . nitroGLYCERIN  0.5 inch Topical 4 times per day  . pantoprazole  40 mg Oral Daily     Infusions: . sodium chloride 10 mL/hr at 07/28/15 1900     PRN Medications:  albuterol, ondansetron **OR** ondansetron (ZOFRAN) IV     Assessment/Plan    1. CAD/chest pain  - no evidence of ACS by EKG or enzymes - Stress test reviewed. Inferoseptal infarct with mild to moderate peri-infarct ischemia, low to intermediate risk study. Based on advanced kidney disease with stage IV CKD along with recent GI bleed due to ulcer, risks benefit assessment favors medical therapy at this time.  - started on lopressor 12.5mg  bid yesterday - has not been on ASA due to GI bleed last month. Will increase atorva to  80mg  daily in setting of known CAD. No ACE/ARB in setting of AKI no CKD. - denies any chest pain or SOB overnight or this AM - of note chest pain occurred in setting of COPD exacerbation as well as recent diagnosis of antral ulcer, may not all be cardiac related, continue to follow symptoms  2. AKI - per primary team   We will sign off inpatient care. Patient will need f/u with is primary cardiologist Dr Antoine PocheHochrein in 3 weeks after discharge.  Dina RichJonathan Carlas Vandyne, M.D.

## 2015-07-31 NOTE — Progress Notes (Signed)
Inpatient Diabetes Program Recommendations  AACE/ADA: New Consensus Statement on Inpatient Glycemic Control (2015)  Target Ranges:  Prepandial:   less than 140 mg/dL      Peak postprandial:   less than 180 mg/dL (1-2 hours)      Critically ill patients:  140 - 180 mg/dL  Results for Patsey BertholdSOUTHERN, Perle B (MRN [id number]) as of 07/31/2015 14:12  Ref. Range 07/30/2015 09:21 07/30/2015 11:06 07/30/2015 16:35 07/30/2015 21:04 07/31/2015 07:30 07/31/2015 11:07  Glucose-Capillary Latest Ref Range: 65-99 mg/dL 086267 (H) 578312 (H) 469214 (H) 313 (H) 219 (H) 287 (H)   Review of Glycemic Control  Diabetes history: DM2 Outpatient Diabetes medications: Levemir 30 units daily, Actos 45 mg daily Current orders for Inpatient glycemic control: Levemir 15 units daily, Novolog 0-15 units TID with meals, Novolog 0-5 units QHS  Inpatient Diabetes Program Recommendations: Insulin - Basal: If steroids are continued as ordered, please consider increasing Lantus to 25 units daily (based on 127 kg x 0.2 units). Insulin - Meal Coverage: If steroids are continued as ordered, please consider ordering Novolog 4 units TID with meals for meal coverage if patient eats at least 50% of meal.  Thanks, Orlando PennerMarie Atianna Haidar, RN, MSN, CDE Diabetes Coordinator Inpatient Diabetes Program [phone] (Team Pager from 8am to 5pm) [phone] ([facility] office) [phone] [facility] office) [phone] [facility] office)

## 2015-07-31 NOTE — Progress Notes (Signed)
[facility] PROGRESS NOTE  [name] ZOX:[id number] DOB: [birth year] DOA: 07/28/2015 PCP: Joycelyn Rua, MD  Assessment/Plan: 1. Chest pain. Pt has ruled out for ACS with negative cardiac markers. He is continued ASA and lopressors. Seen by cardiology and underwent stress test. Stress test reviewed: iInferoseptal infarct with mild to moderate peri-infarct ischemia, low to intermediate risk study. No further inpatient cardiac workup was recommended. Cardiology has recommended outpatient follow up in three weeks with primary cardiologist. Chest pain has resolved. 2. COPD exacerbation, improved. Still has mild wheeze. Placed on bronchodilators, steroids, nebs, and IV Levaquin.  3. DM Type 2, uncontrolled. Started on SSI and Levemir was added to regimen.  4. HLD. Continue statin. 5. AKI on CKD Stage IV. Lasix held on admission but creatinine continues to trend up today. Started on IVF.   Code Status: DNR DVT prophylaxis: Lovenox  Family Communication:Family bedside Disposition Plan: Anticipate discharge home in 24 hours.    Consultants:  Cardiology   Procedures:  None   Antibiotics:  Levaquin   HPI/Subjective: Does not have any chest pain or difficulty breathing.   Objective: Filed Vitals:   07/31/15 0506 07/31/15 1003  BP: 115/57 106/56  Pulse: 83 93  Temp: 98 F (36.7 C)   Resp: 20     Intake/Output Summary (Last 24 hours) at 07/31/15 1308 Last data filed at 07/31/15 1242  Gross per 24 hour  Intake    720 ml  Output   1700 ml  Net   -980 ml   Filed Weights   07/28/15 1416  Weight: 127.914 kg (282 lb)    Exam: General: NAD, looks comfortable Cardiovascular: RRR, S1, S2  Respiratory: Mild wheezes bilaterally  Abdomen: soft, non tender, no distention , bowel sounds normal Musculoskeletal: Trace pedal edema b/l  Data Reviewed: Basic Metabolic Panel:  Recent Labs Lab 07/28/15 1500 07/29/15 0639 07/30/15 0618 07/31/15 0611  NA 135 137 137  133*  K 5.1 5.3* 5.1 5.0  CL 107 107 107 105  CO2 22 25 21* 20*  GLUCOSE 123* 149* 254* 244*  BUN 36* 39* 61* 79*  CREATININE 2.17* 2.01* 2.71* 3.18*  CALCIUM 8.7* 8.6* 8.7* 8.5*   Liver Function Tests:  Recent Labs Lab 07/28/15 1500  AST 25  ALT 18  ALKPHOS 85  BILITOT 0.7  PROT 7.1  ALBUMIN 3.5  CBC:  Recent Labs Lab 07/28/15 1500 07/29/15 0639  WBC 7.2 6.9  NEUTROABS 4.0  --   HGB 11.4* 10.3*  HCT 34.5* 31.9*  MCV 93.5 95.2  PLT 157 148*   Cardiac Enzymes:  Recent Labs Lab 07/28/15 1500 07/28/15 1858 07/29/15 0041 07/29/15 0639  TROPONINI <0.03 <0.03 <0.03 <0.03   CBG:  Recent Labs Lab 07/30/15 1106 07/30/15 1635 07/30/15 2104 07/31/15 0730 07/31/15 1107  GLUCAP 312* 214* 313* 219* 287*    Studies: Nm Myocar Multi W/spect W/wall Motion / Ef  07/30/2015   There was no ST segment deviation noted during stress.  Findings consistent with prior myocardial infarction with peri-infarct ischemia in the inferoseptal wall. There is mild to moderate peri-infarct ischemia.  The left ventricular ejection fraction is hyperdynamic (>65%).  This is a low to intermediate risk test. There is a mild to moderate amount of myocardium at jeopardy in the inferoseptal wall.     Scheduled Meds: . atorvastatin  80 mg Oral q1800  . budesonide (PULMICORT) nebulizer solution  0.25 mg Nebulization BID  . citalopram  20 mg Oral Daily  . enoxaparin (LOVENOX) injection  30 mg Subcutaneous Q24H  . gabapentin  100 mg Oral TID  . insulin aspart  0-15 Units Subcutaneous TID WC  . insulin aspart  0-5 Units Subcutaneous QHS  . insulin detemir  15 Units Subcutaneous Daily  . ipratropium-albuterol  3 mL Nebulization Q6H  . [START ON 08/01/2015] levofloxacin (LEVAQUIN) IV  500 mg Intravenous Q48H  . methylPREDNISolone (SOLU-MEDROL) injection  60 mg Intravenous Q12H  . metoprolol tartrate  12.5 mg Oral BID  . nitroGLYCERIN  0.5 inch Topical 4 times per day  . pantoprazole  40 mg  Oral Daily   Continuous Infusions: . sodium chloride 10 mL/hr at 07/28/15 1900    Principal Problem:   Unstable angina (HCC) Active Problems:   Diabetes (HCC)   Hyperlipidemia   CKD (chronic kidney disease) stage 4, GFR 15-29 ml/min (HCC)   Chest pain   COPD exacerbation (HCC)    Time spent: 25minutes     Erick BlinksMemon, Maronda Caison, MD   [facility] Pager [phone]. If 7PM-7AM, please contact night-coverage at [email or web address], password [email or web address] 07/31/2015, 1:08 PM  LOS: 2 days    By signing my name below, I, Zadie CleverlyJessica Augustus, attest that this documentation has been prepared under the direction and in the presence of Erick BlinksJehanzeb Evrett Hakim, MD. Electronically signed: Zadie CleverlyJessica Augustus, Scribe. 07/31/2015 1:00pm   I, Dr. Erick BlinksJehanzeb Lakeisa Heninger, personally performed the services described in this documentaiton. All medical record entries made by the scribe were at my direction and in my presence. I have reviewed the chart and agree that the record reflects my personal performance and is accurate and complete  Erick BlinksJehanzeb Thurmon Mizell, MD, 07/31/2015 1:19 PM

## 2015-08-01 ENCOUNTER — Ambulatory Visit: Payer: Medicare Other | Admitting: Orthopaedic Surgery

## 2015-08-01 DIAGNOSIS — N179 Acute kidney failure, unspecified: Secondary | ICD-10-CM

## 2015-08-01 LAB — BASIC METABOLIC PANEL
Anion gap: 8 (ref 5–15)
BUN: 72 mg/dL — ABNORMAL HIGH (ref 6–20)
CALCIUM: 8.6 mg/dL — AB (ref 8.9–10.3)
CO2: 21 mmol/L — ABNORMAL LOW (ref 22–32)
CREATININE: 2.71 mg/dL — AB (ref 0.61–1.24)
Chloride: 108 mmol/L (ref 101–111)
GFR calc non Af Amer: 21 mL/min — ABNORMAL LOW (ref >60)
GFR, EST AFRICAN AMERICAN: 24 mL/min — AB (ref >60)
Glucose, Bld: 227 mg/dL — ABNORMAL HIGH (ref 65–99)
Potassium: 4.6 mmol/L (ref 3.5–5.1)
SODIUM: 137 mmol/L (ref 135–145)

## 2015-08-01 LAB — GLUCOSE, CAPILLARY
GLUCOSE-CAPILLARY: 233 mg/dL — AB (ref 65–99)
GLUCOSE-CAPILLARY: 291 mg/dL — AB (ref 65–99)

## 2015-08-01 MED ORDER — METOPROLOL TARTRATE 25 MG PO TABS: 12.5000 mg | ORAL_TABLET | Freq: Two times a day (BID) | ORAL | Status: AC

## 2015-08-01 MED ORDER — POTASSIUM CHLORIDE ER 10 MEQ PO CPCR: 10.0000 meq | ORAL_CAPSULE | Freq: Every day | ORAL | Status: AC

## 2015-08-01 MED ORDER — FUROSEMIDE 20 MG PO TABS: 40.0000 mg | ORAL_TABLET | Freq: Every day | ORAL | Status: AC

## 2015-08-01 MED ORDER — HYDROCODONE-ACETAMINOPHEN 7.5-325 MG PO TABS: 1.0000 | ORAL_TABLET | Freq: Four times a day (QID) | ORAL | Status: DC | PRN

## 2015-08-01 MED ORDER — ALBUTEROL SULFATE (2.5 MG/3ML) 0.083% IN NEBU: 2.5000 mg | INHALATION_SOLUTION | Freq: Four times a day (QID) | RESPIRATORY_TRACT | Status: AC | PRN

## 2015-08-01 MED ORDER — PREDNISONE 10 MG PO TABS: ORAL_TABLET | ORAL | Status: DC

## 2015-08-01 NOTE — Care Management (Signed)
Nebulizer machine delivered to room by DME provider [facility].

## 2015-08-01 NOTE — Discharge Summary (Signed)
Physician Discharge Summary  [name] ZOX:[id number] DOB: [birth year] DOA: 07/28/2015  PCP: Joycelyn Rua, MD  Admit date: 07/28/2015 Discharge date: 08/01/2015  Time spent: 35 minutes  Recommendations for Outpatient Follow-up:  1. Follow up with PCP in 1-2 weeks 2. Follow up with Dr. Norman Lions of Cardio in 3 wks as outpatient   Discharge Diagnoses:  Principal Problem:   Chest pain Active Problems:   Diabetes (HCC)   Hyperlipidemia   CKD (chronic kidney disease) stage 4, GFR 15-29 ml/min (HCC)   Acute kidney injury   Chest pain   COPD exacerbation (HCC)   Discharge Condition: Improved  Diet recommendation: heart healthy  Filed Weights   07/28/15 1416  Weight: 127.914 kg (282 lb)    History of present illness:  17 yom with PMH of HTN, HLD, DM Type 2, GERD, Morbid obesity, Coronary atherosclerosis of native coronary artery, CKD Stage IV presented with complaints of of CP that onset around late in the morning. Pain noted to radiate to both arms, shoulders, and upper back. Mild relief with IV NTG. He was admitted for further evaluation of pain and cardiac monitoring.   Hospital Course:  Patient was admitted due to complaints of chest with and without exertion with concerns for unstable angina. On admission placed on NTG drip, which was weaned, and continued ASA. Serial troponin were negative. Per cardiology started on Lopressor. Pt underwent Stress test which revealed: iInferoseptal infarct with mild to moderate peri-infarct ischemia, low to intermediate risk study. Cardiology has recommended outpatient follow up in three weeks with Dr. [place] Lions. Chest pain has resolved and on discharge he reports no recurrence.  1. COPD exacerbation, improved. Placed on bronchodilators, steroids, nebs, and IV Levaquin. On discharge will place on prednisone taper and bronchodilators 2. DM Type 2, uncontrolled. Started on SSI and Levemir was added to regimen.  3. HLD. Continue  statin. 4. AKI on CKD Stage IV. Felt to be related to Lasix. Pt ws hydrated with IVF with some improvement in renal function. Will continue to hold Lasix until 3/14, after which it may be resumed.   Procedures:  none  Consultations:  Cardiology  Discharge Exam: Filed Vitals:   08/01/15 0420 08/01/15 0825  BP: 136/78 120/70  Pulse: 84 87  Temp: 98.4 F (36.9 C)   Resp: 20      General: NAD, looks comfortable  Cardiovascular: RRR, S1, S2   Respiratory: mild wheeze bilaterally  Abdomen: soft, non tender, no distention , bowel sounds normal  Musculoskeletal: No edema b/l   Discharge Instructions   Discharge Instructions    DME Nebulizer machine    Complete by:  As directed           Current Discharge Medication List    START taking these medications   Details  albuterol (PROVENTIL) (2.5 MG/3ML) 0.083% nebulizer solution Take 3 mLs (2.5 mg total) by nebulization every 6 (six) hours as needed for wheezing or shortness of breath. Qty: 75 mL, Refills: 12    metoprolol tartrate (LOPRESSOR) 25 MG tablet Take 0.5 tablets (12.5 mg total) by mouth 2 (two) times daily. Qty: 30 tablet, Refills: 1    predniSONE (DELTASONE) 10 MG tablet Take  po daily for 2 days then  daily for 2 days then  daily for 2 days then  daily for 2 days then stop Qty: 20 tablet, Refills: 0      CONTINUE these medications which have CHANGED   Details  furosemide (LASIX) 20 MG tablet Take 2 tablets (  40 mg total) by mouth daily. Restart on 3/14 Qty: 30 tablet    HYDROcodone-acetaminophen (NORCO) 7.5-325 MG tablet Take 1 tablet by mouth every 6 (six) hours as needed for moderate pain. Qty: 14 tablet, Refills: 0    potassium chloride (MICRO-K) 10 MEQ CR capsule Take 1 capsule (10 mEq total) by mouth daily. Restart on 3/14      CONTINUE these medications which have NOT CHANGED   Details  albuterol (PROVENTIL HFA;VENTOLIN HFA) 108 (90 BASE) MCG/ACT inhaler Inhale 2 puffs into  the lungs every 4 (four) hours as needed for wheezing or shortness of breath.    aspirin EC 81 MG tablet Take 81 mg by mouth daily.    atorvastatin (LIPITOR) 20 MG tablet TAKE 1 TABLET BY MOUTH AT BEDTIME FOR CHOLESTEROL. Qty: 30 tablet, Refills: 0    citalopram (CELEXA) 40 MG tablet Take 0.5 tablets (20 mg total) by mouth daily. Qty: 30 tablet, Refills: 1    gabapentin (NEURONTIN) 100 MG capsule Take 100 mg by mouth 3 (three) times daily.    insulin detemir (LEVEMIR) 100 UNIT/ML injection Inject 0.25 mLs (25 Units total) into the skin daily.    pioglitazone (ACTOS) 45 MG tablet Take 45 mg by mouth daily.     SPIRIVA RESPIMAT 2.5 MCG/ACT AERS Inhale 1 puff into the lungs daily.       STOP taking these medications     naproxen (NAPROSYN) 500 MG tablet        No Known Allergies    The results of significant diagnostics from this hospitalization (including imaging, microbiology, ancillary and laboratory) are listed below for reference.    Significant Diagnostic Studies: Nm Myocar Multi W/spect W/wall Motion / Ef  07/30/2015   There was no ST segment deviation noted during stress.  Findings consistent with prior myocardial infarction with peri-infarct ischemia in the inferoseptal wall. There is mild to moderate peri-infarct ischemia.  The left ventricular ejection fraction is hyperdynamic (>65%).  This is a low to intermediate risk test. There is a mild to moderate amount of myocardium at jeopardy in the inferoseptal wall.    Dg Chest Portable 1 View  07/28/2015  CLINICAL DATA:  Chest pain radiating to the shoulders and arms bilaterally. EXAM: PORTABLE CHEST 1 VIEW COMPARISON:  06/06/2015 FINDINGS: Previous median sternotomy and CABG. Chronic mild cardiac enlargement. Chronic atherosclerosis of the aorta. Pulmonary vascularity is normal. The lungs are clear. No effusions. Bony structures unremarkable. IMPRESSION: Previous CABG.  No evidence of heart failure. Electronically Signed    By: Paulina Fusi M.D.   On: 07/28/2015 15:22    Microbiology: No results found for this or any previous visit (from the past 240 hour(s)).   Labs: Basic Metabolic Panel:  Recent Labs Lab 07/28/15 1500 07/29/15 0639 07/30/15 0618 07/31/15 0611 08/01/15 0610  NA 135 137 137 133* 137  K 5.1 5.3* 5.1 5.0 4.6  CL 107 107 107 105 108  CO2 22 25 21* 20* 21*  GLUCOSE 123* 149* 254* 244* 227*  BUN 36* 39* 61* 79* 72*  CREATININE 2.17* 2.01* 2.71* 3.18* 2.71*  CALCIUM 8.7* 8.6* 8.7* 8.5* 8.6*   Liver Function Tests:  Recent Labs Lab 07/28/15 1500  AST 25  ALT 18  ALKPHOS 85  BILITOT 0.7  PROT 7.1  ALBUMIN 3.5   No results for input(s): LIPASE, AMYLASE in the last 168 hours. No results for input(s): AMMONIA in the last 168 hours. CBC:  Recent Labs Lab 07/28/15 1500 07/29/15 4098  WBC 7.2 6.9  NEUTROABS 4.0  --   HGB 11.4* 10.3*  HCT 34.5* 31.9*  MCV 93.5 95.2  PLT 157 148*   Cardiac Enzymes:  Recent Labs Lab 07/28/15 1500 07/28/15 1858 07/29/15 0041 07/29/15 0639  TROPONINI <0.03 <0.03 <0.03 <0.03   BNP: BNP (last 3 results) No results for input(s): BNP in the last 8760 hours.  ProBNP (last 3 results) No results for input(s): PROBNP in the last 8760 hours.  CBG:  Recent Labs Lab 07/31/15 1107 07/31/15 1658 07/31/15 2016 08/01/15 0737 08/01/15 1125  GLUCAP 287* 258* 260* 233* 291*       Signed:  Erick BlinksJehanzeb Memon, MD. [facility] 08/01/2015, 12:37 PM   By signing my name below, I, Adron BeneGreylon Gawaluck, attest that this documentation has been prepared under the direction and in the presence of Erick BlinksJehanzeb Memon, MD. Electronically Signed: Adron BeneGreylon Gawaluck  08/01/2015  I, Dr. Erick BlinksJehanzeb Memon, personally performed the services described in this documentaiton. All medical record entries made by the scribe were at my direction and in my presence. I have reviewed the chart and agree that the record reflects my personal performance and is accurate  and complete  Erick BlinksJehanzeb Memon, MD, 08/01/2015 12:37 PM

## 2015-08-01 NOTE — Care Management Important Message (Signed)
Important Message  Patient Details  Name: [name] MRN: [id number] Date of Birth: December 19, 1933   Medicare Important Message Given:  Yes    Adonis HugueninBerkhead, Kimiko Common L, RN 08/01/2015, 2:31 PM

## 2015-08-01 NOTE — Progress Notes (Signed)
Pt's IV catheter removed and intact. Pt's IV site clean dry and intact. Discharge instructions including medications and follow up appointments reviewed and discussed with patient's wife. Pt's wife verbalized understanding of discharge instructions including medications. All questions were answered and no further questions at this time. Pt in stable condition and in no acute distress at time of discharge. Pt will be escorted by nurse tech.

## 2015-08-01 NOTE — Care Management Note (Signed)
Case Management Note  Patient Details  Name: Patsey BertholdWalter B Wass MRN: [id number] Date of Birth: Jan 27, 1934  Subjective/Objective:     Spoke with patient and spouse for discharge planning patient is from home and ambulates with a cane.  He stated that he has a "hoveround" as well. Which is a type of electric scooter.    He is not on O2.  No other DME.  Denies issues with obtaining medications. Will dicharge with [facility].  Referall placed.           Action/Plan:   Expected Discharge Date:  07/30/15               Expected Discharge Plan:  Home w Home Health Services  In-House Referral:     Discharge planning Services  CM Consult  Post Acute Care Choice:    Choice offered to:  Patient  DME Arranged:    DME Agency:     HH Arranged:  RN, PT, Nurse's Aide [facility]  Status of Service:  Completed, signed off  Medicare Important Message Given:  Yes Date Medicare IM Given:    Medicare IM give by:    Date Additional Medicare IM Given:    Additional Medicare Important Message give by:     If discussed at Long Length of Stay Meetings, dates discussed:    Additional Comments:  Adonis HugueninBerkhead, Shannel Zahm L, RN 08/01/2015, 2:44 PM

## 2015-08-12 ENCOUNTER — Telehealth: Payer: Self-pay

## 2015-08-12 ENCOUNTER — Encounter: Payer: Self-pay | Admitting: Gastroenterology

## 2015-08-12 ENCOUNTER — Ambulatory Visit (INDEPENDENT_AMBULATORY_CARE_PROVIDER_SITE_OTHER): Payer: Medicare Other | Admitting: Gastroenterology

## 2015-08-12 VITALS — BP 115/60 | HR 62 | Temp 97.6°F | Ht 72.0 in | Wt 292.4 lb

## 2015-08-12 DIAGNOSIS — K279 Peptic ulcer, site unspecified, unspecified as acute or chronic, without hemorrhage or perforation: Secondary | ICD-10-CM | POA: Insufficient documentation

## 2015-08-12 MED ORDER — PANTOPRAZOLE SODIUM 40 MG PO TBEC: 40.0000 mg | DELAYED_RELEASE_TABLET | Freq: Two times a day (BID) | ORAL | Status: AC

## 2015-08-12 NOTE — Patient Instructions (Signed)
Do not take Naprosyn. I am not sure if you are taking it or not, but make sure it is not a medicine that you take.   It is important that you take Protonix twice a day, 30 minutes before breakfast and dinner. This is to help with ulcer healing.   I need to get clearance from your cardiologist before we do the upper endoscopy.  You will need an upper endoscopy with Dr. Darrick PennaFields to make sure the ulcer has healed.

## 2015-08-12 NOTE — Progress Notes (Addendum)
REVIEWED-NO ADDITIONAL RECOMMENDATIONS.  Referring Provider: Joycelyn Orr, Stephen, MD Primary Care Physician:  [name], STEPHEN, MD  Primary GI: Dr. Darrick PennaFields   Chief Complaint  Patient presents with  . EGD    HPI:   [name] is a 82 y.o. male presenting today with a history of UGI bleed in Jan 2017 secondary to large antral ulcer, receiving 2 units PRBCs. Negative H.pylori. Needs surveillance EGD now.   Notes vague supraumbilical discomfort with eating. Occasional pain with eating. No nausea. No vomiting. Good appetite. Not on a PPI. Unclear if he is on Naprosyn or not. Recently inpatient with cardiac issues early March 2017. Was admitted with chest pain. Serial troponin was negative. Stress test noted an inferoseptal infarct with mild to moderate peri-infarct ischemia, low to intermediate risk study. Medical therapy recommended. There was no evidence of acute coronary syndrome by EKG or enzymes. He is to see Dr. Antoine PocheHochrein in the future.   Past Medical History  Diagnosis Date  . Hypertension   . Hyperlipidemia   . Diabetes mellitus, type II, insulin dependent (HCC)   . Coronary atherosclerosis of native coronary artery 2003    Multivessel s/p CABG  . Morbid obesity (HCC)   . GERD (gastroesophageal reflux disease)   . History of tobacco abuse   . Back pain   . Hard of hearing   . Aortic root dilatation (HCC)   . CKD (chronic kidney disease) stage 4, GFR 15-29 ml/min [facility](HCC)     Past Surgical History  Procedure Laterality Date  . Appendectomy    . Lumbar disc surgery      x2  . Coronary artery bypass graft  June 2003    LIMA to LAD, SVG to diagonal, sequential SVG to obtuse marginal and circumf SVG to RCAl  . Angioplasty    . Colonoscopy w/ polypectomy    . Skin cancer excision    . Hip arthroplasty Left 07/25/2013    Procedure: ARTHROPLASTY BIPOLAR HIP;  Surgeon: Darreld McleanWayne Keeling, MD;  Location: [facility];  Service: Orthopedics;  Laterality: Left;  . Esophagogastroduodenoscopy N/A  06/07/2015    Dr. Fields:large antral ulcer/nodular gastritis and mild duodenitis. Negative H.pylori  . Cardiac surgery      Current Outpatient Prescriptions  Medication Sig Dispense Refill  . albuterol (PROVENTIL HFA;VENTOLIN HFA) 108 (90 BASE) MCG/ACT inhaler Inhale 2 puffs into the lungs every 4 (four) hours as needed for wheezing or shortness of breath.    Marland Kitchen. albuterol (PROVENTIL) (2.5 MG/3ML) 0.083% nebulizer solution Take 3 mLs (2.5 mg total) by nebulization every 6 (six) hours as needed for wheezing or shortness of breath. 75 mL 12  . aspirin EC 81 MG tablet Take 81 mg by mouth daily.    Marland Kitchen. atorvastatin (LIPITOR) 20 MG tablet TAKE 1 TABLET BY MOUTH AT BEDTIME FOR CHOLESTEROL. 30 tablet 0  . citalopram (CELEXA) 40 MG tablet Take 0.5 tablets (20 mg total) by mouth daily. 30 tablet 1  . furosemide (LASIX) 20 MG tablet Take 2 tablets (40 mg total) by mouth daily. Restart on 3/14 30 tablet   . gabapentin (NEURONTIN) 100 MG capsule Take 100 mg by mouth 3 (three) times daily.    Marland Kitchen. HYDROcodone-acetaminophen (NORCO) 7.5-325 MG tablet Take 1 tablet by mouth every 6 (six) hours as needed for moderate pain. 14 tablet 0  . metoprolol tartrate (LOPRESSOR) 25 MG tablet Take 0.5 tablets (12.5 mg total) by mouth 2 (two) times daily. 30 tablet 1  . pioglitazone (ACTOS) 45 MG tablet Take  45 mg by mouth daily.     . potassium chloride (MICRO-K) 10 MEQ CR capsule Take 1 capsule (10 mEq total) by mouth daily. Restart on 3/14    . SPIRIVA RESPIMAT 2.5 MCG/ACT AERS Inhale 1 puff into the lungs daily.     . insulin detemir (LEVEMIR) 100 UNIT/ML injection Inject 0.25 mLs (25 Units total) into the skin daily. (Patient not taking: Reported on 08/12/2015)    . predniSONE (DELTASONE) 10 MG tablet Take  po daily for 2 days then  daily for 2 days then  daily for 2 days then  daily for 2 days then stop (Patient not taking: Reported on 08/12/2015) 20 tablet 0   No current facility-administered medications for  this visit.    Allergies as of 08/12/2015  . (No Known Allergies)    Family History  Problem Relation Age of Onset  . Heart disease Father   . Heart attack Child     Social History   Social History  . Marital Status: Married    Spouse Name: N/A  . Number of Children: N/A  . Years of Education: N/A   Occupational History  . Retired from [employer] works    Social History Main Topics  . Smoking status: Former Smoker -- 1.00 packs/day for 60 years    Types: Cigarettes    Quit date: 05/25/2000  . Smokeless tobacco: Never Used  . Alcohol Use: No  . Drug Use: No  . Sexual Activity: Not Asked   Other Topics Concern  . None   Social History Narrative   Lives with wife of 52 years   Enjoys watching his great-grandson    Review of Systems: Gen: Denies fever, chills, anorexia. Denies fatigue, weakness, weight loss.  CV: see HPI  Resp: +DOE, +SOB GI: see HPI  Derm: Denies rash, itching, dry skin Psych: some confusion  Heme: Denies bruising, bleeding, and enlarged lymph nodes.  Physical Exam: BP 115/60 mmHg  Pulse 62  Temp(Src) 97.6 F (36.4 C) (Oral)  Ht 6' (1.829 m)  Wt 292 lb 6.4 oz (132.632 kg)  BMI 39.65 kg/m2 General:   Alert and oriented. No distress noted. Pleasant and cooperative.  Head:  Normocephalic and atraumatic. Eyes:  Conjuctiva clear without scleral icterus. Mouth:  Oral mucosa pink and moist. Good dentition. No lesions. Heart:  S1, S2 present without murmurs Abdomen:  +BS, soft, obese, non-tender and non-distended. No rebound or guarding. No HSM or masses noted. Msk:  Symmetrical without gross deformities. Normal posture. Extremities:  2+ lower extremity edema  Neurologic:  Alert and  oriented x4;  grossly normal neurologically. Skin:  Intact without significant lesions or rashes. Psych:  Alert and cooperative. Normal mood and affect.  Lab Results  Component Value Date   WBC 6.9 07/29/2015   HGB 10.3* 07/29/2015   HCT 31.9* 07/29/2015   MCV  95.2 07/29/2015   PLT 148* 07/29/2015

## 2015-08-12 NOTE — Telephone Encounter (Signed)
Dr. Antoine PocheHochrein,   This patient was seen in our office today by Gerrit HallsAnna Sams, NP and she would like to schedule him an EGD for Peptic Ulcer Disease in the next couple of weeks.  We would like to know if you can clear him for the procedure which will be scheduled with Dr. [name].  He was recently seen in the ED for unstable angina.  Please advise!  Thanks,  Saad Buhl H. Roseanne RenoStewart, LPN

## 2015-08-13 ENCOUNTER — Ambulatory Visit (INDEPENDENT_AMBULATORY_CARE_PROVIDER_SITE_OTHER): Payer: Medicare Other | Admitting: Orthopaedic Surgery

## 2015-08-13 ENCOUNTER — Encounter: Payer: Self-pay | Admitting: Orthopaedic Surgery

## 2015-08-13 VITALS — BP 115/53 | HR 68 | Temp 97.5°F | Resp 18 | Ht 72.0 in | Wt 292.0 lb

## 2015-08-13 DIAGNOSIS — M25561 Pain in right knee: Secondary | ICD-10-CM | POA: Diagnosis not present

## 2015-08-13 MED ORDER — HYDROCODONE-ACETAMINOPHEN 7.5-325 MG PO TABS: 1.0000 | ORAL_TABLET | ORAL | Status: DC | PRN

## 2015-08-13 NOTE — Progress Notes (Signed)
Patient ZO:XWRUEA B Mcduffie, male DOB:[birth year], 82 y.o. VWU:[id number]  Chief Complaint  Patient presents with  . Follow-up    follow up Lt    knee    HPI  SHIRLEY BOLLE is a 82 y.o. male who has a history of left knee pain.  I have done arthroscopy on the left knee in the past.  He has also had a left bipolar hip replacement.  He has chronic left knee pain but more recently he has been bothered by right knee pain.  He has been in the hospital several times recently.  He had a significant GI bleed and more recently has had heart problems.  He has had swelling in the legs but that has improved.  His right knee is swelling, popping but not giving way.   He has no redness, no trauma.  His right knee bothers him after walking.  He uses a cane.  HPI  Body mass index is 39.59 kg/(m^2).  Review of Systems  Patient has Diabetes Mellitus. Patient has hypertension. Patient has COPD or shortness of breath. Patient has BMI > 35. Patient has current smoking history.  Review of Systems  HENT: Positive for congestion.   Respiratory: Positive for chest tightness, shortness of breath and wheezing.   Cardiovascular: Positive for chest pain, palpitations and leg swelling.  Gastrointestinal: Positive for abdominal pain, blood in stool and abdominal distention.  Endocrine: Positive for cold intolerance.  Musculoskeletal: Positive for myalgias, back pain, joint swelling, arthralgias and gait problem.  Allergic/Immunologic: Positive for environmental allergies.    Past Medical History  Diagnosis Date  . Hypertension   . Hyperlipidemia   . Diabetes mellitus, type II, insulin dependent (HCC)   . Coronary atherosclerosis of native coronary artery 2003    Multivessel s/p CABG  . Morbid obesity (HCC)   . GERD (gastroesophageal reflux disease)   . History of tobacco abuse   . Back pain   . Hard of hearing   . Aortic root dilatation (HCC)   . CKD (chronic kidney disease) stage 4, GFR 15-29  ml/min [facility])     Past Surgical History  Procedure Laterality Date  . Appendectomy    . Lumbar disc surgery      x2  . Coronary artery bypass graft  June 2003    LIMA to LAD, SVG to diagonal, sequential SVG to obtuse marginal and circumf SVG to RCAl  . Angioplasty    . Colonoscopy w/ polypectomy    . Skin cancer excision    . Hip arthroplasty Left 07/25/2013    Procedure: ARTHROPLASTY BIPOLAR HIP;  Surgeon: Darreld Mclean, MD;  Location: [facility];  Service: Orthopedics;  Laterality: Left;  . Esophagogastroduodenoscopy N/A 06/07/2015    Dr. Fields:large antral ulcer/nodular gastritis and mild duodenitis. Negative H.pylori  . Cardiac surgery      Family History  Problem Relation Age of Onset  . Heart disease Father   . Heart attack Child     Social History Social History  Substance Use Topics  . Smoking status: Former Smoker -- 1.00 packs/day for 60 years    Types: Cigarettes    Quit date: 05/25/2000  . Smokeless tobacco: Never Used  . Alcohol Use: No    No Known Allergies  Current Outpatient Prescriptions  Medication Sig Dispense Refill  . albuterol (PROVENTIL HFA;VENTOLIN HFA) 108 (90 BASE) MCG/ACT inhaler Inhale 2 puffs into the lungs every 4 (four) hours as needed for wheezing or shortness of breath.    Marland Kitchen  albuterol (PROVENTIL) (2.5 MG/3ML) 0.083% nebulizer solution Take 3 mLs (2.5 mg total) by nebulization every 6 (six) hours as needed for wheezing or shortness of breath. 75 mL 12  . aspirin EC 81 MG tablet Take 81 mg by mouth daily.    Marland Kitchen atorvastatin (LIPITOR) 20 MG tablet TAKE 1 TABLET BY MOUTH AT BEDTIME FOR CHOLESTEROL. 30 tablet 0  . citalopram (CELEXA) 40 MG tablet Take 0.5 tablets (20 mg total) by mouth daily. 30 tablet 1  . furosemide (LASIX) 20 MG tablet Take 2 tablets (40 mg total) by mouth daily. Restart on 3/14 30 tablet   . gabapentin (NEURONTIN) 100 MG capsule Take 100 mg by mouth 3 (three) times daily.    . insulin detemir (LEVEMIR) 100 UNIT/ML injection  Inject 0.25 mLs (25 Units total) into the skin daily.    . metoprolol tartrate (LOPRESSOR) 25 MG tablet Take 0.5 tablets (12.5 mg total) by mouth 2 (two) times daily. 30 tablet 1  . naproxen (NAPROSYN) 500 MG tablet     . pantoprazole (PROTONIX) 40 MG tablet Take 1 tablet (40 mg total) by mouth 2 (two) times daily before a meal. 180 tablet 3  . pioglitazone (ACTOS) 45 MG tablet Take 45 mg by mouth daily.     . potassium chloride (MICRO-K) 10 MEQ CR capsule Take 1 capsule (10 mEq total) by mouth daily. Restart on 3/14    . SPIRIVA RESPIMAT 2.5 MCG/ACT AERS Inhale 1 puff into the lungs daily.     Marland Kitchen HYDROcodone-acetaminophen (NORCO) 7.5-325 MG tablet Take 1 tablet by mouth every 4 (four) hours as needed for moderate pain (Must last 30 days.  Do not drive or operate machinery while taking this medicine.). 120 tablet 0   No current facility-administered medications for this visit.     Physical Exam  Blood pressure 115/53, pulse 68, temperature 97.5 F (36.4 C), resp. rate 18, height 6' (1.829 m), weight 292 lb (132.45 kg).  Constitutional: overall normal hygiene, normal nutrition, well developed, normal grooming, normal body habitus. Assistive device:cane  Musculoskeletal: gait and station Limp right, muscle tone and strength are normal, no tremors or atrophy is present.  .  Neurological: coordination overall normal.  Deep tendon reflex/nerve stretch intact.  Sensation normal.  Cranial nerves II-XII intact.   Skin:   Well healed scars of left knee, otherwise overall no scars, lesions, ulcers or rashes. No psoriasis.  Psychiatric: Alert and oriented x 3.  Recent memory intact, remote memory unclear.  Normal mood and affect. Well groomed.  Good eye contact.  Cardiovascular: overall no swelling, no varicosities, no edema bilaterally, normal temperatures of the legs and arms, no clubbing, cyanosis and good capillary refill.  Lymphatic: palpation is normal.  The right lower extremity is  examined:  Inspection:  Thigh:  Non-tender and no defects  Knee has swelling 1+ effusion.                        Joint tenderness is present                        Patient is tender over the medial joint line  Lower Leg:  Has normal appearance and no tenderness or defects  Ankle:  Non-tender and no defects  Foot:  Non-tender and no defects Range of Motion:  Knee:  Range of motion is: 0-105  Crepitus is  present  Ankle:  Range of motion is normal. Strength and Tone:  The right lower extremity has normal strength and tone. Stability:  Knee:  The knee is stable.  Ankle:  The ankle is stable. PROCEDURE NOTE:  The patient request injection, verbal consent was obtained.  Theright knee was prepped appropriately after time out was performed.   Sterile technique was observed and injection of 1 cc of Depo-Medrol 40 mg with several cc's of plain xylocaine. Anesthesia was provided by ethyl chloride and a 20-gauge needle was used to inject the knee area. The injection was tolerated well.  A band aid dressing was applied.  The patient was advised to apply ice later today and tomorrow to the injection sight as needed.  Additional services performed: I talked to him about his GI tract.  He has no dark tarry stools.  He has no abdominal pain.  He has had leg swelling but that is better now after he has been treated for his heart condition.  His wife watches his weight for him.  His diabetes is under somewhat good control now.  Again, his wife gets after him and what he eats.   The patient has been educated about the nature of the problem(s) and counseled on treatment options.  The patient appeared to understand what I have discussed and is in agreement with it.  PLAN Call if any problems.  Precautions discussed.  Continue current medications.   Return to clinic 3 months.

## 2015-08-14 NOTE — Assessment & Plan Note (Signed)
82 year old male with history of UGI bleed in Jan 2017 with large antral ulcer, needing surveillance EGD now. Unfortunately, he has not been on a PPI and it is unclear if he is taking Naprosyn or not. He was also admitted earlier this month with chest pain, and we will need to obtain cardiology clearance prior to surveillance.   Start Protonix BID AVOID all NSAIDs Cardiology clearance Proceed with upper endoscopy in the near future with Dr. Darrick PennaFields. The risks, benefits, and alternatives have been discussed in detail with patient. They have stated understanding and desire to proceed.

## 2015-08-15 NOTE — Progress Notes (Signed)
CC'D TO PCP °

## 2015-08-15 NOTE — Telephone Encounter (Signed)
He will need to be seen in our clinic following the ED visit prior to being cleared for a procedure.

## 2015-08-15 NOTE — Telephone Encounter (Signed)
REVIEWED. AGREE. NO ADDITIONAL RECOMMENDATIONS. 

## 2015-08-16 ENCOUNTER — Encounter: Payer: Self-pay | Admitting: *Deleted

## 2015-08-18 NOTE — Telephone Encounter (Signed)
Noted and agree. Please have patient return to see me after cardiology visit to arrange procedure.

## 2015-08-19 ENCOUNTER — Ambulatory Visit: Payer: Medicaid Other | Admitting: Diagnostic Neuroimaging

## 2015-08-20 ENCOUNTER — Encounter: Payer: Self-pay | Admitting: Diagnostic Neuroimaging

## 2015-08-20 NOTE — Telephone Encounter (Signed)
Tried to call. Many rings and no answer.  

## 2015-08-21 NOTE — Telephone Encounter (Signed)
Called. Many rings and no answer. Mailing a letter to remind him he will need another appointment after his cardiac appointment.

## 2015-09-16 ENCOUNTER — Telehealth: Payer: Self-pay | Admitting: Orthopaedic Surgery

## 2015-09-16 MED ORDER — HYDROCODONE-ACETAMINOPHEN 7.5-325 MG PO TABS: 1.0000 | ORAL_TABLET | ORAL | Status: DC | PRN

## 2015-09-16 NOTE — Telephone Encounter (Signed)
Rx Done . 

## 2015-09-16 NOTE — Telephone Encounter (Signed)
Patient called and requested a refill on Norco  7.5-325 mgs. Qty 120   Sig: Take 1 tablet by mouth every 4 (four) hours as needed for moderate pain (Must last 30 days.  Do not drive or operate machinery while taking this medicine °

## 2015-09-25 ENCOUNTER — Ambulatory Visit: Payer: Medicare Other | Admitting: Cardiology

## 2015-10-15 ENCOUNTER — Telehealth: Payer: Self-pay | Admitting: Orthopaedic Surgery

## 2015-10-15 MED ORDER — HYDROCODONE-ACETAMINOPHEN 7.5-325 MG PO TABS: 1.0000 | ORAL_TABLET | ORAL | Status: DC | PRN

## 2015-10-15 NOTE — Telephone Encounter (Signed)
Rx done. 

## 2015-10-15 NOTE — Telephone Encounter (Signed)
Hydrocodone-Acetaminophen 7.5/325mg Qty 120 Tablets °

## 2015-11-13 ENCOUNTER — Ambulatory Visit: Payer: Medicare Other | Admitting: Orthopaedic Surgery

## 2015-11-18 ENCOUNTER — Telehealth: Payer: Self-pay | Admitting: Orthopaedic Surgery

## 2015-11-18 NOTE — Telephone Encounter (Signed)
Hydrocodone-Acetaminophen 7.5/325mg Qty 120 Tablets °

## 2015-11-19 MED ORDER — HYDROCODONE-ACETAMINOPHEN 7.5-325 MG PO TABS: 1.0000 | ORAL_TABLET | ORAL | Status: DC | PRN

## 2015-11-19 NOTE — Telephone Encounter (Signed)
Rx done. 

## 2015-11-27 ENCOUNTER — Ambulatory Visit (INDEPENDENT_AMBULATORY_CARE_PROVIDER_SITE_OTHER): Payer: Medicare Other | Admitting: Orthopaedic Surgery

## 2015-11-27 ENCOUNTER — Encounter: Payer: Self-pay | Admitting: Orthopaedic Surgery

## 2015-11-27 VITALS — BP 172/90 | HR 82 | Temp 97.3°F | Ht 72.0 in | Wt 280.0 lb

## 2015-11-27 DIAGNOSIS — I200 Unstable angina: Secondary | ICD-10-CM

## 2015-11-27 DIAGNOSIS — M25561 Pain in right knee: Secondary | ICD-10-CM

## 2015-11-27 NOTE — Progress Notes (Signed)
Patient ZO:XWRUEA:[name], male DOB:1933-05-30, 82 y.o. VWU:[id number]  Chief Complaint  Patient presents with  . Knee Pain    HPI  [name] is a 82 y.o. male who has chronic left knee pain.  He is doing well.  He has no new trauma. He has swelling and popping but no giving way, no redness, no locking.  He is using his cane.  HPI  Body mass index is 37.97 kg/(m^2).  ROS  Review of Systems  HENT: Positive for congestion.   Respiratory: Positive for chest tightness, shortness of breath and wheezing.   Cardiovascular: Positive for chest pain, palpitations and leg swelling.  Gastrointestinal: Positive for abdominal pain, blood in stool and abdominal distention.  Endocrine: Positive for cold intolerance.  Musculoskeletal: Positive for myalgias, back pain, joint swelling, arthralgias and gait problem.  Allergic/Immunologic: Positive for environmental allergies.    Past Medical History  Diagnosis Date  . Hypertension   . Hyperlipidemia   . Diabetes mellitus, type II, insulin dependent (HCC)   . Coronary atherosclerosis of native coronary artery 2003    Multivessel s/p CABG  . Morbid obesity (HCC)   . GERD (gastroesophageal reflux disease)   . History of tobacco abuse   . Back pain   . Hard of hearing   . Aortic root dilatation (HCC)   . CKD (chronic kidney disease) stage 4, GFR 15-29 ml/min [facility](HCC)     Past Surgical History  Procedure Laterality Date  . Appendectomy    . Lumbar disc surgery      x2  . Coronary artery bypass graft  June 2003    LIMA to LAD, SVG to diagonal, sequential SVG to obtuse marginal and circumf SVG to RCAl  . Angioplasty    . Colonoscopy w/ polypectomy    . Skin cancer excision    . Hip arthroplasty Left 07/25/2013    Procedure: ARTHROPLASTY BIPOLAR HIP;  Surgeon: Darreld McleanWayne Camora Tremain, MD;  Location: [facility];  Service: Orthopedics;  Laterality: Left;  . Esophagogastroduodenoscopy N/A 06/07/2015    Dr. Fields:large antral ulcer/nodular gastritis and  mild duodenitis. Negative H.pylori  . Cardiac surgery      Family History  Problem Relation Age of Onset  . Heart disease Father   . Heart attack Child     Social History Social History  Substance Use Topics  . Smoking status: Former Smoker -- 1.00 packs/day for 60 years    Types: Cigarettes    Quit date: 05/25/2000  . Smokeless tobacco: Never Used  . Alcohol Use: No    No Known Allergies  Current Outpatient Prescriptions  Medication Sig Dispense Refill  . albuterol (PROVENTIL HFA;VENTOLIN HFA) 108 (90 BASE) MCG/ACT inhaler Inhale 2 puffs into the lungs every 4 (four) hours as needed for wheezing or shortness of breath.    Marland Kitchen. albuterol (PROVENTIL) (2.5 MG/3ML) 0.083% nebulizer solution Take 3 mLs (2.5 mg total) by nebulization every 6 (six) hours as needed for wheezing or shortness of breath. 75 mL 12  . aspirin EC 81 MG tablet Take 81 mg by mouth daily.    Marland Kitchen. atorvastatin (LIPITOR) 20 MG tablet TAKE 1 TABLET BY MOUTH AT BEDTIME FOR CHOLESTEROL. 30 tablet 0  . citalopram (CELEXA) 40 MG tablet Take 0.5 tablets (20 mg total) by mouth daily. 30 tablet 1  . furosemide (LASIX) 20 MG tablet Take 2 tablets (40 mg total) by mouth daily. Restart on 3/14 30 tablet   . gabapentin (NEURONTIN) 100 MG capsule Take 100 mg by  mouth 3 (three) times daily.    Marland Kitchen. HYDROcodone-acetaminophen (NORCO) 7.5-325 MG tablet Take 1 tablet by mouth every 4 (four) hours as needed for moderate pain (Must last 30 days.  Do not drive or operate machinery while taking this medicine.). 120 tablet 0  . insulin detemir (LEVEMIR) 100 UNIT/ML injection Inject 0.25 mLs (25 Units total) into the skin daily.    . metoprolol tartrate (LOPRESSOR) 25 MG tablet Take 0.5 tablets (12.5 mg total) by mouth 2 (two) times daily. 30 tablet 1  . naproxen (NAPROSYN) 500 MG tablet     . pantoprazole (PROTONIX) 40 MG tablet Take 1 tablet (40 mg total) by mouth 2 (two) times daily before a meal. 180 tablet 3  . pioglitazone (ACTOS) 45 MG  tablet Take 45 mg by mouth daily.     . potassium chloride (MICRO-K) 10 MEQ CR capsule Take 1 capsule (10 mEq total) by mouth daily. Restart on 3/14    . SPIRIVA RESPIMAT 2.5 MCG/ACT AERS Inhale 1 puff into the lungs daily.      No current facility-administered medications for this visit.     Physical Exam  Blood pressure 172/90, pulse 82, temperature 97.3 F (36.3 C), height 6' (1.829 m), weight 280 lb (127.007 kg).  Constitutional: overall normal hygiene, normal nutrition, well developed, normal grooming, normal body habitus. Assistive device:cane  Musculoskeletal: gait and station Limp left, muscle tone and strength are normal, no tremors or atrophy is present.  .  Neurological: coordination overall normal.  Deep tendon reflex/nerve stretch intact.  Sensation normal.  Cranial nerves II-XII intact.   Skin:   normal overall no scars, lesions, ulcers or rashes. No psoriasis.  Psychiatric: Alert and oriented x 3.  Recent memory intact, remote memory unclear.  Normal mood and affect. Well groomed.  Good eye contact.  Cardiovascular: overall no swelling, no varicosities, no edema bilaterally, normal temperatures of the legs and arms, no clubbing, cyanosis and good capillary refill.  Lymphatic: palpation is normal.  The left lower extremity is examined:  Inspection:  Thigh:  Non-tender and no defects  Knee has swelling 1+ effusion.                        Joint tenderness is present                        Patient is tender over the medial joint line  Lower Leg:  Has normal appearance and no tenderness or defects  Ankle:  Non-tender and no defects  Foot:  Non-tender and no defects Range of Motion:  Knee:  Range of motion is: 0-100                        Crepitus is  present  Ankle:  Range of motion is normal. Strength and Tone:  The left lower extremity has normal strength and tone. Stability:  Knee:  The knee is stable.  Ankle:  The ankle is stable.    The patient has  been educated about the nature of the problem(s) and counseled on treatment options.  The patient appeared to understand what I have discussed and is in agreement with it.  Encounter Diagnosis  Name Primary?  . Right knee pain Yes    PLAN Call if any problems.  Precautions discussed.  Continue current medications.   Return to clinic 3 months   Electronically Signed Darreld McleanWayne Mckinzie Saksa, MD 7/5/20179:44 AM

## 2015-12-04 ENCOUNTER — Encounter (HOSPITAL_COMMUNITY): Payer: Self-pay | Admitting: *Deleted

## 2015-12-04 ENCOUNTER — Emergency Department (HOSPITAL_COMMUNITY): Payer: Medicare Other | Admitting: Anesthesiology

## 2015-12-04 ENCOUNTER — Inpatient Hospital Stay (HOSPITAL_COMMUNITY)
Admission: EM | Admit: 2015-12-04 | Discharge: 2015-12-24 | DRG: 326 | Disposition: E | Payer: Medicare Other | Attending: General Surgery | Admitting: General Surgery

## 2015-12-04 ENCOUNTER — Encounter (HOSPITAL_COMMUNITY): Admission: EM | Disposition: E | Payer: Self-pay | Source: Home / Self Care | Attending: General Surgery

## 2015-12-04 ENCOUNTER — Emergency Department (HOSPITAL_COMMUNITY): Payer: Medicare Other

## 2015-12-04 DIAGNOSIS — Z85828 Personal history of other malignant neoplasm of skin: Secondary | ICD-10-CM

## 2015-12-04 DIAGNOSIS — Z794 Long term (current) use of insulin: Secondary | ICD-10-CM

## 2015-12-04 DIAGNOSIS — Z515 Encounter for palliative care: Secondary | ICD-10-CM | POA: Diagnosis present

## 2015-12-04 DIAGNOSIS — Z96642 Presence of left artificial hip joint: Secondary | ICD-10-CM | POA: Diagnosis present

## 2015-12-04 DIAGNOSIS — Z7982 Long term (current) use of aspirin: Secondary | ICD-10-CM | POA: Diagnosis not present

## 2015-12-04 DIAGNOSIS — N184 Chronic kidney disease, stage 4 (severe): Secondary | ICD-10-CM | POA: Diagnosis present

## 2015-12-04 DIAGNOSIS — K921 Melena: Secondary | ICD-10-CM | POA: Diagnosis not present

## 2015-12-04 DIAGNOSIS — R0682 Tachypnea, not elsewhere classified: Secondary | ICD-10-CM

## 2015-12-04 DIAGNOSIS — E1122 Type 2 diabetes mellitus with diabetic chronic kidney disease: Secondary | ICD-10-CM | POA: Diagnosis present

## 2015-12-04 DIAGNOSIS — K631 Perforation of intestine (nontraumatic): Secondary | ICD-10-CM | POA: Diagnosis not present

## 2015-12-04 DIAGNOSIS — I959 Hypotension, unspecified: Secondary | ICD-10-CM | POA: Diagnosis not present

## 2015-12-04 DIAGNOSIS — Z87891 Personal history of nicotine dependence: Secondary | ICD-10-CM

## 2015-12-04 DIAGNOSIS — I2510 Atherosclerotic heart disease of native coronary artery without angina pectoris: Secondary | ICD-10-CM | POA: Diagnosis present

## 2015-12-04 DIAGNOSIS — J449 Chronic obstructive pulmonary disease, unspecified: Secondary | ICD-10-CM | POA: Diagnosis present

## 2015-12-04 DIAGNOSIS — N189 Chronic kidney disease, unspecified: Secondary | ICD-10-CM

## 2015-12-04 DIAGNOSIS — H9190 Unspecified hearing loss, unspecified ear: Secondary | ICD-10-CM | POA: Diagnosis present

## 2015-12-04 DIAGNOSIS — E43 Unspecified severe protein-calorie malnutrition: Secondary | ICD-10-CM | POA: Diagnosis present

## 2015-12-04 DIAGNOSIS — Z951 Presence of aortocoronary bypass graft: Secondary | ICD-10-CM | POA: Diagnosis not present

## 2015-12-04 DIAGNOSIS — Z6835 Body mass index (BMI) 35.0-35.9, adult: Secondary | ICD-10-CM

## 2015-12-04 DIAGNOSIS — I129 Hypertensive chronic kidney disease with stage 1 through stage 4 chronic kidney disease, or unspecified chronic kidney disease: Secondary | ICD-10-CM | POA: Diagnosis present

## 2015-12-04 DIAGNOSIS — Z7951 Long term (current) use of inhaled steroids: Secondary | ICD-10-CM

## 2015-12-04 DIAGNOSIS — Z79899 Other long term (current) drug therapy: Secondary | ICD-10-CM

## 2015-12-04 DIAGNOSIS — K265 Chronic or unspecified duodenal ulcer with perforation: Principal | ICD-10-CM | POA: Diagnosis present

## 2015-12-04 DIAGNOSIS — K219 Gastro-esophageal reflux disease without esophagitis: Secondary | ICD-10-CM | POA: Diagnosis present

## 2015-12-04 DIAGNOSIS — E1165 Type 2 diabetes mellitus with hyperglycemia: Secondary | ICD-10-CM | POA: Diagnosis present

## 2015-12-04 DIAGNOSIS — Z8249 Family history of ischemic heart disease and other diseases of the circulatory system: Secondary | ICD-10-CM

## 2015-12-04 DIAGNOSIS — E875 Hyperkalemia: Secondary | ICD-10-CM | POA: Diagnosis present

## 2015-12-04 DIAGNOSIS — N170 Acute kidney failure with tubular necrosis: Secondary | ICD-10-CM | POA: Diagnosis present

## 2015-12-04 DIAGNOSIS — Z7189 Other specified counseling: Secondary | ICD-10-CM | POA: Diagnosis not present

## 2015-12-04 DIAGNOSIS — N179 Acute kidney failure, unspecified: Secondary | ICD-10-CM

## 2015-12-04 DIAGNOSIS — Z66 Do not resuscitate: Secondary | ICD-10-CM | POA: Diagnosis present

## 2015-12-04 DIAGNOSIS — E870 Hyperosmolality and hypernatremia: Secondary | ICD-10-CM | POA: Diagnosis not present

## 2015-12-04 DIAGNOSIS — D638 Anemia in other chronic diseases classified elsewhere: Secondary | ICD-10-CM | POA: Diagnosis present

## 2015-12-04 DIAGNOSIS — R1031 Right lower quadrant pain: Secondary | ICD-10-CM | POA: Diagnosis present

## 2015-12-04 DIAGNOSIS — E785 Hyperlipidemia, unspecified: Secondary | ICD-10-CM | POA: Diagnosis present

## 2015-12-04 DIAGNOSIS — E876 Hypokalemia: Secondary | ICD-10-CM | POA: Diagnosis not present

## 2015-12-04 DIAGNOSIS — Z8711 Personal history of peptic ulcer disease: Secondary | ICD-10-CM

## 2015-12-04 DIAGNOSIS — Z87442 Personal history of urinary calculi: Secondary | ICD-10-CM

## 2015-12-04 HISTORY — PX: LAPAROTOMY: SHX154

## 2015-12-04 HISTORY — PX: GASTRORRHAPHY: SHX6263

## 2015-12-04 LAB — CBC WITH DIFFERENTIAL/PLATELET
BASOS ABS: 0 10*3/uL (ref 0.0–0.1)
BASOS PCT: 0 %
EOS PCT: 0 %
Eosinophils Absolute: 0 10*3/uL (ref 0.0–0.7)
HEMATOCRIT: 30.5 % — AB (ref 39.0–52.0)
Hemoglobin: 10.1 g/dL — ABNORMAL LOW (ref 13.0–17.0)
LYMPHS PCT: 10 %
Lymphs Abs: 1.4 10*3/uL (ref 0.7–4.0)
MCH: 29.7 pg (ref 26.0–34.0)
MCHC: 33.1 g/dL (ref 30.0–36.0)
MCV: 89.7 fL (ref 78.0–100.0)
MONO ABS: 1.3 10*3/uL — AB (ref 0.1–1.0)
Monocytes Relative: 10 %
NEUTROS ABS: 10.9 10*3/uL — AB (ref 1.7–7.7)
Neutrophils Relative %: 80 %
PLATELETS: 205 10*3/uL (ref 150–400)
RBC: 3.4 MIL/uL — AB (ref 4.22–5.81)
RDW: 13.8 % (ref 11.5–15.5)
WBC: 13.6 10*3/uL — AB (ref 4.0–10.5)

## 2015-12-04 LAB — GLUCOSE, CAPILLARY
GLUCOSE-CAPILLARY: 129 mg/dL — AB (ref 65–99)
GLUCOSE-CAPILLARY: 126 mg/dL — AB (ref 65–99)
Glucose-Capillary: 137 mg/dL — ABNORMAL HIGH (ref 65–99)

## 2015-12-04 LAB — COMPREHENSIVE METABOLIC PANEL
ALK PHOS: 63 U/L (ref 38–126)
ALT: 10 U/L — ABNORMAL LOW (ref 17–63)
ANION GAP: 11 (ref 5–15)
AST: 16 U/L (ref 15–41)
Albumin: 3.1 g/dL — ABNORMAL LOW (ref 3.5–5.0)
BILIRUBIN TOTAL: 0.8 mg/dL (ref 0.3–1.2)
BUN: 80 mg/dL — ABNORMAL HIGH (ref 6–20)
CALCIUM: 8.7 mg/dL — AB (ref 8.9–10.3)
CO2: 19 mmol/L — ABNORMAL LOW (ref 22–32)
Chloride: 103 mmol/L (ref 101–111)
Creatinine, Ser: 4.16 mg/dL — ABNORMAL HIGH (ref 0.61–1.24)
GFR calc Af Amer: 14 mL/min — ABNORMAL LOW (ref >60)
GFR, EST NON AFRICAN AMERICAN: 12 mL/min — AB (ref >60)
Glucose, Bld: 132 mg/dL — ABNORMAL HIGH (ref 65–99)
POTASSIUM: 5 mmol/L (ref 3.5–5.1)
Sodium: 133 mmol/L — ABNORMAL LOW (ref 135–145)
TOTAL PROTEIN: 7.4 g/dL (ref 6.5–8.1)

## 2015-12-04 SURGERY — LAPAROTOMY, EXPLORATORY
Anesthesia: General | Site: Abdomen

## 2015-12-04 MED ORDER — HYDROMORPHONE HCL 1 MG/ML IJ SOLN
1.0000 mg | INTRAMUSCULAR | Status: DC | PRN
Start: 2015-12-04 — End: 2015-12-17
  Administered 2015-12-04 – 2015-12-11 (×19): 1 mg via INTRAVENOUS
  Filled 2015-12-04 (×19): qty 1

## 2015-12-04 MED ORDER — MIDAZOLAM HCL 2 MG/2ML IJ SOLN
INTRAMUSCULAR | Status: AC
  Filled 2015-12-04: qty 2

## 2015-12-04 MED ORDER — GLYCOPYRROLATE 0.2 MG/ML IJ SOLN
INTRAMUSCULAR | Status: DC | PRN
  Administered 2015-12-04: 0.6 mg via INTRAVENOUS

## 2015-12-04 MED ORDER — SODIUM CHLORIDE 0.9 % IJ SOLN
INTRAMUSCULAR | Status: AC
  Filled 2015-12-04: qty 10

## 2015-12-04 MED ORDER — MORPHINE SULFATE (PF) 4 MG/ML IV SOLN
4.0000 mg | Freq: Once | INTRAVENOUS | Status: AC
  Administered 2015-12-04: 4 mg via INTRAVENOUS
  Filled 2015-12-04: qty 1

## 2015-12-04 MED ORDER — ONDANSETRON HCL 4 MG/2ML IJ SOLN
4.0000 mg | Freq: Four times a day (QID) | INTRAMUSCULAR | Status: DC | PRN
  Administered 2015-12-12 – 2015-12-14 (×3): 4 mg via INTRAVENOUS
  Filled 2015-12-04 (×3): qty 2

## 2015-12-04 MED ORDER — MIDAZOLAM HCL 2 MG/2ML IJ SOLN
1.0000 mg | INTRAMUSCULAR | Status: DC | PRN
Start: 2015-12-04 — End: 2015-12-04
  Administered 2015-12-04: 2 mg via INTRAVENOUS

## 2015-12-04 MED ORDER — METOPROLOL TARTRATE 5 MG/5ML IV SOLN
INTRAVENOUS | Status: DC | PRN
  Administered 2015-12-04 (×2): 2.5 mg via INTRAVENOUS

## 2015-12-04 MED ORDER — FAMOTIDINE IN NACL 20-0.9 MG/50ML-% IV SOLN
20.0000 mg | Freq: Two times a day (BID) | INTRAVENOUS | Status: DC
  Administered 2015-12-04 – 2015-12-05 (×2): 20 mg via INTRAVENOUS
  Filled 2015-12-04 (×4): qty 50

## 2015-12-04 MED ORDER — ETOMIDATE 2 MG/ML IV SOLN
INTRAVENOUS | Status: DC | PRN
  Administered 2015-12-04: 14 mg via INTRAVENOUS

## 2015-12-04 MED ORDER — CHLORHEXIDINE GLUCONATE CLOTH 2 % EX PADS: 6.0000 | MEDICATED_PAD | Freq: Once | CUTANEOUS | Status: DC

## 2015-12-04 MED ORDER — FENTANYL CITRATE (PF) 100 MCG/2ML IJ SOLN
INTRAMUSCULAR | Status: AC
  Filled 2015-12-04: qty 2

## 2015-12-04 MED ORDER — SODIUM CHLORIDE 0.9 % IV BOLUS (SEPSIS)
1000.0000 mL | Freq: Once | INTRAVENOUS | Status: AC
Start: 2015-12-04 — End: 2015-12-04
  Administered 2015-12-04: 1000 mL via INTRAVENOUS

## 2015-12-04 MED ORDER — ACETAMINOPHEN 325 MG PO TABS
650.0000 mg | ORAL_TABLET | Freq: Four times a day (QID) | ORAL | Status: DC | PRN
  Filled 2015-12-04: qty 2

## 2015-12-04 MED ORDER — POVIDONE-IODINE 10 % EX OINT
TOPICAL_OINTMENT | CUTANEOUS | Status: AC
  Filled 2015-12-04: qty 1

## 2015-12-04 MED ORDER — ALBUTEROL SULFATE (2.5 MG/3ML) 0.083% IN NEBU: 2.5000 mg | INHALATION_SOLUTION | RESPIRATORY_TRACT | Status: DC | PRN

## 2015-12-04 MED ORDER — FENTANYL CITRATE (PF) 250 MCG/5ML IJ SOLN
INTRAMUSCULAR | Status: AC
  Filled 2015-12-04: qty 5

## 2015-12-04 MED ORDER — ONDANSETRON HCL 4 MG/2ML IJ SOLN: 4.0000 mg | Freq: Once | INTRAMUSCULAR | Status: DC | PRN

## 2015-12-04 MED ORDER — EPHEDRINE SULFATE 50 MG/ML IJ SOLN
INTRAMUSCULAR | Status: AC
  Filled 2015-12-04: qty 1

## 2015-12-04 MED ORDER — ROCURONIUM BROMIDE 100 MG/10ML IV SOLN
INTRAVENOUS | Status: DC | PRN
  Administered 2015-12-04: 50 mg via INTRAVENOUS

## 2015-12-04 MED ORDER — SODIUM CHLORIDE 0.9 % IV SOLN
INTRAVENOUS | Status: DC
  Administered 2015-12-04: 1000 mL via INTRAVENOUS
  Administered 2015-12-04 – 2015-12-05 (×2): via INTRAVENOUS

## 2015-12-04 MED ORDER — ETOMIDATE 2 MG/ML IV SOLN
INTRAVENOUS | Status: AC
  Filled 2015-12-04: qty 10

## 2015-12-04 MED ORDER — LORAZEPAM 2 MG/ML IJ SOLN
1.0000 mg | INTRAMUSCULAR | Status: DC | PRN
  Administered 2015-12-05 – 2015-12-16 (×9): 1 mg via INTRAVENOUS
  Filled 2015-12-04 (×11): qty 1

## 2015-12-04 MED ORDER — PIPERACILLIN-TAZOBACTAM 3.375 G IVPB
3.3750 g | Freq: Three times a day (TID) | INTRAVENOUS | Status: DC
  Administered 2015-12-05: 3.375 g via INTRAVENOUS
  Filled 2015-12-04: qty 50

## 2015-12-04 MED ORDER — PROPOFOL 10 MG/ML IV BOLUS
INTRAVENOUS | Status: AC
  Filled 2015-12-04: qty 20

## 2015-12-04 MED ORDER — NEOSTIGMINE METHYLSULFATE 10 MG/10ML IV SOLN
INTRAVENOUS | Status: DC | PRN
  Administered 2015-12-04: 4 mg via INTRAVENOUS

## 2015-12-04 MED ORDER — NEOSTIGMINE METHYLSULFATE 10 MG/10ML IV SOLN
INTRAVENOUS | Status: AC
  Filled 2015-12-04: qty 1

## 2015-12-04 MED ORDER — ACETAMINOPHEN 650 MG RE SUPP: 650.0000 mg | Freq: Four times a day (QID) | RECTAL | Status: DC | PRN

## 2015-12-04 MED ORDER — FENTANYL CITRATE (PF) 100 MCG/2ML IJ SOLN
INTRAMUSCULAR | Status: DC | PRN
  Administered 2015-12-04: 25 ug via INTRAVENOUS
  Administered 2015-12-04: 100 ug via INTRAVENOUS
  Administered 2015-12-04 (×2): 50 ug via INTRAVENOUS
  Administered 2015-12-04: 25 ug via INTRAVENOUS
  Administered 2015-12-04: 50 ug via INTRAVENOUS

## 2015-12-04 MED ORDER — SODIUM CHLORIDE 0.9 % IR SOLN
Status: DC | PRN
  Administered 2015-12-04: 2000 mL
  Administered 2015-12-04: 1000 mL

## 2015-12-04 MED ORDER — HEMOSTATIC AGENTS (NO CHARGE) OPTIME
TOPICAL | Status: DC | PRN
  Administered 2015-12-04: 1 via TOPICAL

## 2015-12-04 MED ORDER — PHENYLEPHRINE 40 MCG/ML (10ML) SYRINGE FOR IV PUSH (FOR BLOOD PRESSURE SUPPORT)
PREFILLED_SYRINGE | INTRAVENOUS | Status: AC
  Filled 2015-12-04: qty 10

## 2015-12-04 MED ORDER — GLYCOPYRROLATE 0.2 MG/ML IJ SOLN
INTRAMUSCULAR | Status: AC
  Filled 2015-12-04: qty 3

## 2015-12-04 MED ORDER — POVIDONE-IODINE 10 % OINT PACKET
TOPICAL_OINTMENT | CUTANEOUS | Status: DC | PRN
  Administered 2015-12-04: 1 via TOPICAL

## 2015-12-04 MED ORDER — ROCURONIUM BROMIDE 50 MG/5ML IV SOLN
INTRAVENOUS | Status: AC
  Filled 2015-12-04: qty 1

## 2015-12-04 MED ORDER — HYDROMORPHONE HCL 1 MG/ML IJ SOLN
1.0000 mg | Freq: Once | INTRAMUSCULAR | Status: AC
  Administered 2015-12-04: 1 mg via INTRAVENOUS
  Filled 2015-12-04: qty 1

## 2015-12-04 MED ORDER — SODIUM CHLORIDE 0.9 % IV SOLN
INTRAVENOUS | Status: DC
  Administered 2015-12-04: 1000 mL via INTRAVENOUS

## 2015-12-04 MED ORDER — FENTANYL CITRATE (PF) 100 MCG/2ML IJ SOLN: 25.0000 ug | INTRAMUSCULAR | Status: DC | PRN

## 2015-12-04 MED ORDER — LIDOCAINE HCL (PF) 1 % IJ SOLN
INTRAMUSCULAR | Status: AC
  Filled 2015-12-04: qty 5

## 2015-12-04 MED ORDER — LIDOCAINE HCL (CARDIAC) 20 MG/ML IV SOLN
INTRAVENOUS | Status: DC | PRN
  Administered 2015-12-04: 25 mg via INTRAVENOUS

## 2015-12-04 MED ORDER — ARTIFICIAL TEARS OP OINT
TOPICAL_OINTMENT | OPHTHALMIC | Status: AC
  Filled 2015-12-04: qty 7

## 2015-12-04 MED ORDER — ENOXAPARIN SODIUM 30 MG/0.3ML ~~LOC~~ SOLN
30.0000 mg | SUBCUTANEOUS | Status: DC
  Administered 2015-12-05 – 2015-12-09 (×5): 30 mg via SUBCUTANEOUS
  Filled 2015-12-04 (×5): qty 0.3

## 2015-12-04 MED ORDER — SODIUM CHLORIDE 0.9 % IV SOLN
3.0000 g | Freq: Once | INTRAVENOUS | Status: AC
  Administered 2015-12-04: 3 g via INTRAVENOUS
  Filled 2015-12-04: qty 3

## 2015-12-04 MED ORDER — ONDANSETRON 4 MG PO TBDP
4.0000 mg | ORAL_TABLET | Freq: Four times a day (QID) | ORAL | Status: DC | PRN
  Filled 2015-12-04: qty 1

## 2015-12-04 MED ORDER — PHENYLEPHRINE HCL 10 MG/ML IJ SOLN
INTRAMUSCULAR | Status: DC | PRN
  Administered 2015-12-04: 40 ug via INTRAVENOUS

## 2015-12-04 MED ORDER — SODIUM CHLORIDE 0.9 % IV BOLUS (SEPSIS)
1000.0000 mL | Freq: Once | INTRAVENOUS | Status: AC
  Administered 2015-12-04: 1000 mL via INTRAVENOUS

## 2015-12-04 MED ORDER — ONDANSETRON HCL 4 MG/2ML IJ SOLN
4.0000 mg | Freq: Once | INTRAMUSCULAR | Status: AC
  Administered 2015-12-04: 4 mg via INTRAVENOUS
  Filled 2015-12-04: qty 2

## 2015-12-04 SURGICAL SUPPLY — 78 items
APL SRG 38 LTWT LNG FL B (MISCELLANEOUS) ×1
APPLICATOR ARISTA FLEXITIP XL (MISCELLANEOUS) ×2 IMPLANT
APPLIER CLIP 11 MED OPEN (CLIP)
APPLIER CLIP 13 LRG OPEN (CLIP)
APR CLP LRG 13 20 CLIP (CLIP)
APR CLP MED 11 20 MLT OPN (CLIP)
BAG HAMPER (MISCELLANEOUS) ×3 IMPLANT
BARRIER SKIN 2 3/4 (OSTOMY) IMPLANT
BARRIER SKIN 2 3/4 INCH (OSTOMY)
BARRIER SKIN OD2.25 2 3/4 FLNG (OSTOMY) IMPLANT
BRR SKN FLT 2.75X2.25 2 PC (OSTOMY)
CELLS DAT CNTRL 66122 CELL SVR (MISCELLANEOUS) IMPLANT
CHLORAPREP W/TINT 26ML (MISCELLANEOUS) ×3 IMPLANT
CLAMP POUCH DRAINAGE QUIET (OSTOMY) IMPLANT
CLIP APPLIE 11 MED OPEN (CLIP) IMPLANT
CLIP APPLIE 13 LRG OPEN (CLIP) IMPLANT
CLOTH BEACON ORANGE TIMEOUT ST (SAFETY) ×3 IMPLANT
COVER LIGHT HANDLE STERIS (MISCELLANEOUS) ×6 IMPLANT
DRAPE WARM FLUID 44X44 (DRAPE) ×1 IMPLANT
DRSG OPSITE POSTOP 4X10 (GAUZE/BANDAGES/DRESSINGS) ×3 IMPLANT
ELECT BLADE 6 FLAT ULTRCLN (ELECTRODE) IMPLANT
ELECT REM PT RETURN 9FT ADLT (ELECTROSURGICAL) ×3
ELECTRODE REM PT RTRN 9FT ADLT (ELECTROSURGICAL) ×1 IMPLANT
EVACUATOR DRAINAGE 10X20 100CC (DRAIN) IMPLANT
EVACUATOR SILICONE 100CC (DRAIN) ×3
GAUZE SPONGE 4X4 12PLY STRL (GAUZE/BANDAGES/DRESSINGS) ×3 IMPLANT
GLOVE BIOGEL M 7.0 STRL (GLOVE) ×2 IMPLANT
GLOVE BIOGEL PI IND STRL 7.0 (GLOVE) ×2 IMPLANT
GLOVE BIOGEL PI INDICATOR 7.0 (GLOVE) ×4
GLOVE EXAM NITRILE MD LF STRL (GLOVE) ×2 IMPLANT
GLOVE SURG SS PI 7.5 STRL IVOR (GLOVE) ×3 IMPLANT
GOWN STRL REUS W/TWL LRG LVL3 (GOWN DISPOSABLE) ×9 IMPLANT
HANDLE SUCTION POOLE (INSTRUMENTS) ×1 IMPLANT
HEMOSTAT ARISTA ABSORB 3G PWDR (MISCELLANEOUS) ×2 IMPLANT
INST SET MAJOR GENERAL (KITS) ×3 IMPLANT
KIT REMOVER STAPLE SKIN (MISCELLANEOUS) IMPLANT
KIT ROOM TURNOVER APOR (KITS) ×3 IMPLANT
LIGASURE IMPACT 36 18CM CVD LR (INSTRUMENTS) ×2 IMPLANT
MANIFOLD NEPTUNE II (INSTRUMENTS) ×3 IMPLANT
NDL HYPO 18GX1.5 BLUNT FILL (NEEDLE) ×1 IMPLANT
NEEDLE HYPO 18GX1.5 BLUNT FILL (NEEDLE) IMPLANT
NS IRRIG 1000ML POUR BTL (IV SOLUTION) ×8 IMPLANT
PACK ABDOMINAL MAJOR (CUSTOM PROCEDURE TRAY) ×3 IMPLANT
PAD ARMBOARD 7.5X6 YLW CONV (MISCELLANEOUS) ×5 IMPLANT
POUCH OSTOMY 2 3/4  H 3804 (WOUND CARE)
POUCH OSTOMY 2 3/4 H 3804 (WOUND CARE)
POUCH OSTOMY 2 PC DRNBL 2.75 (WOUND CARE) IMPLANT
RELOAD LINEAR CUT PROX 55 BLUE (ENDOMECHANICALS) IMPLANT
RELOAD PROXIMATE 75MM BLUE (ENDOMECHANICALS) IMPLANT
RELOAD STAPLE 55 3.8 BLU REG (ENDOMECHANICALS) IMPLANT
RELOAD STAPLE 75 3.8 BLU REG (ENDOMECHANICALS) IMPLANT
RETRACTOR WND ALEXIS 18 MED (MISCELLANEOUS) ×1 IMPLANT
RETRACTOR WND ALEXIS 25 LRG (MISCELLANEOUS) IMPLANT
RTRCTR WOUND ALEXIS 18CM MED (MISCELLANEOUS)
RTRCTR WOUND ALEXIS 25CM LRG (MISCELLANEOUS) ×3
SET BASIN LINEN APH (SET/KITS/TRAYS/PACK) ×3 IMPLANT
SPONGE DRAIN TRACH 4X4 STRL 2S (GAUZE/BANDAGES/DRESSINGS) ×2 IMPLANT
SPONGE LAP 18X18 X RAY DECT (DISPOSABLE) ×3 IMPLANT
STAPLER GUN LINEAR PROX 60 (STAPLE) IMPLANT
STAPLER PROXIMATE 55 BLUE (STAPLE) IMPLANT
STAPLER PROXIMATE 75MM BLUE (STAPLE) IMPLANT
STAPLER VISISTAT (STAPLE) ×3 IMPLANT
SUCTION POOLE HANDLE (INSTRUMENTS) ×3
SUT CHROMIC 0 SH (SUTURE) IMPLANT
SUT CHROMIC 2 0 SH (SUTURE) IMPLANT
SUT CHROMIC 3 0 SH 27 (SUTURE) ×3 IMPLANT
SUT NOVA NAB GS-26 0 60 (SUTURE) ×6 IMPLANT
SUT PDS AB 0 CTX 60 (SUTURE) IMPLANT
SUT PROLENE 2 0 SH 30 (SUTURE) ×1 IMPLANT
SUT SILK 2 0 (SUTURE) ×3
SUT SILK 2 0 REEL (SUTURE) ×1 IMPLANT
SUT SILK 2-0 18XBRD TIE 12 (SUTURE) ×1 IMPLANT
SUT SILK 3 0 SH CR/8 (SUTURE) IMPLANT
SYR 20CC LL (SYRINGE) ×1 IMPLANT
SYR BULB IRRIGATION 50ML (SYRINGE) ×2 IMPLANT
SYRINGE IRR TOOMEY STRL 70CC (SYRINGE) ×2 IMPLANT
TAPE CLOTH SURG 4X10 WHT LF (GAUZE/BANDAGES/DRESSINGS) ×2 IMPLANT
TRAY FOLEY CATH SILVER 16FR (SET/KITS/TRAYS/PACK) ×3 IMPLANT

## 2015-12-04 NOTE — ED Notes (Signed)
MD at bedside. 

## 2015-12-04 NOTE — Transfer of Care (Signed)
Immediate Anesthesia Transfer of Care Note  Patient: [name]  Procedure(s) Performed: Procedure(s): EXPLORATORY LAPAROTOMY (N/A) GASTRORRHAPHY (N/A)  Patient Location: PACU  Anesthesia Type:General  Level of Consciousness: awake, oriented and patient cooperative  Airway & Oxygen Therapy: Patient Spontanous Breathing and Patient connected to face mask oxygen  Post-op Assessment: Report given to RN and Post -op Vital signs reviewed and stable  Post vital signs: Reviewed and stable  Last Vitals:  Filed Vitals:   11/30/2015 1400 12/17/2015 1405  BP: 137/73 144/77  Pulse:    Temp:    Resp: 19 14    Last Pain:  Filed Vitals:   12/16/2015 1405  PainSc: 9       Patients Stated Pain Goal: 8 (11/23/2015 1335)  Complications: No apparent anesthesia complications

## 2015-12-04 NOTE — Addendum Note (Signed)
Addendum  created 12/06/2015 1629 by Earleen NewportAmy A Akshitha Culmer, CRNA   Modules edited: Anesthesia Events, Anesthesia Medication Administration, Narrator   Narrator:  Narrator: Event Log Edited

## 2015-12-04 NOTE — Progress Notes (Addendum)
Pharmacy Antibiotic Note  [name] is a 82 y.o. male admitted on 12-13-2015 with intr-abdominal infection.  Pharmacy has been consulted for zosyn  dosing. CT scan the abdomen revealed perforated viscus, consistent with perforated duodenal ulcer.  Plan: Zosyn 3.375g IV q8h (4 hour infusion).  F/U plan and cultures Monitor labs and renal function  Height: 6\' 2"  (188 cm) Weight: 280 lb (127.007 kg) IBW/kg (Calculated) : 82.2  Temp (24hrs), Avg:98 F (36.7 C), Min:97.6 F (36.4 C), Max:98.3 F (36.8 C)   Recent Labs Lab Feb 10, 2016 1011  WBC 13.6*  CREATININE 4.16*    Estimated Creatinine Clearance: 20.4 mL/min (by C-G formula based on Cr of 4.16).    No Known Allergies  Antimicrobials this admission: Zosyn 7/12 >>  Unasyn x 1 dose 7/12  Dose adjustments this admission:   Microbiology results:   Thank you for allowing pharmacy to be a part of this patient's care. Elder CyphersLorie Larene Ascencio, BS Loura Backharm D, [place] Clinical Pharmacist Pager [phone]  12-13-2015 5:08 PM

## 2015-12-04 NOTE — Anesthesia Postprocedure Evaluation (Signed)
Anesthesia Post Note  Patient: Warnell BureauWalter B Popiel  Procedure(s) Performed: Procedure(s) (LRB): EXPLORATORY LAPAROTOMY (N/A) GASTRORRHAPHY (N/A)  Patient location during evaluation: PACU Anesthesia Type: General Level of consciousness: awake and oriented Pain management: pain level controlled Vital Signs Assessment: post-procedure vital signs reviewed and stable Respiratory status: spontaneous breathing, respiratory function stable and patient connected to face mask oxygen Cardiovascular status: stable Postop Assessment: no signs of nausea or vomiting Anesthetic complications: no    Last Vitals:  Filed Vitals:   12/10/2015 1545 12/17/2015 1600  BP: 136/61 119/63  Pulse: 96 95  Temp:    Resp: 22 25    Last Pain:  Filed Vitals:   11/24/2015 1602  PainSc: 0-No pain                 Wilburta Milbourn A

## 2015-12-04 NOTE — Progress Notes (Signed)
eLink Physician-Brief Progress Note Patient Name: [name] DOB: 10-05-1933 MRN: [id number]   Date of Service  11/26/2015  HPI/Events of Note  New patient evaluation.  eICU Interventions  Nothing further to add.     Intervention Category Major Interventions: Other:  Marella Vanderpol 12/02/2015, 5:57 PM

## 2015-12-04 NOTE — ED Notes (Addendum)
Per EMS pt hasn't been feeling with abdominal pain and vomiting starting 2 days ago. Pt has had some fatigue and is pale upon triage. Per EMS pt has BP of 98/60, Pt has had a 500 bolus with increase in BP. CBG 133  Pt has blood noted on the front of his clothing. He states he vomited up blood.

## 2015-12-04 NOTE — H&P (Signed)
[name] is an 82 y.o. male.   Chief Complaint: Abdominal pain HPI: Patient is a 82 year old white male multiple medical problems who presents with a 48-hour history of worsening abdominal pain. His wife states he was feeling sick to his stomach yesterday but refused to come the emergency room. This morning, his abdominal pain worsened and the patient was brought to the [facility] emergency room for further evaluation and treatment. He states he is not had this pain before. His appetite is decreased. CT scan the abdomen revealed perforated viscus, consistent with perforated duodenal ulcer. Patient had an EGD in January 2017 which showed a large antral ulcer and nodular gastritis with mild duodenitis.  Past Medical History  Diagnosis Date  . Hypertension   . Hyperlipidemia   . Diabetes mellitus, type II, insulin dependent ([place])   . Coronary atherosclerosis of native coronary artery 2003    Multivessel s/p CABG  . Morbid obesity ([place])   . GERD (gastroesophageal reflux disease)   . History of tobacco abuse   . Back pain   . Hard of hearing   . Aortic root dilatation ([place])   . CKD (chronic kidney disease) stage 4, GFR 15-29 ml/min [facility])     Past Surgical History  Procedure Laterality Date  . Appendectomy    . Lumbar disc surgery      x2  . Coronary artery bypass graft  June 2003    LIMA to LAD, SVG to diagonal, sequential SVG to obtuse marginal and circumf SVG to RCAl  . Angioplasty    . Colonoscopy w/ polypectomy    . Skin cancer excision    . Hip arthroplasty Left 07/25/2013    Procedure: ARTHROPLASTY BIPOLAR HIP;  Surgeon: Sanjuana Kava, MD;  Location: [facility];  Service: Orthopedics;  Laterality: Left;  . Esophagogastroduodenoscopy N/A 06/07/2015    Dr. Fields:large antral ulcer/nodular gastritis and mild duodenitis. Negative H.pylori  . Cardiac surgery      Family History  Problem Relation Age of Onset  . Heart disease Father   . Heart attack Child    Social History:   reports that he quit smoking about 15 years ago. His smoking use included Cigarettes. He has a 60 pack-year smoking history. He has never used smokeless tobacco. He reports that he does not drink alcohol or use illicit drugs.  Allergies: No Known Allergies  Medications Prior to Admission  Medication Sig Dispense Refill  . aspirin EC 81 MG tablet Take 81 mg by mouth daily.    Marland Kitchen atorvastatin (LIPITOR) 20 MG tablet TAKE 1 TABLET BY MOUTH AT BEDTIME FOR CHOLESTEROL. 30 tablet 0  . furosemide (LASIX) 20 MG tablet Take 2 tablets (40 mg total) by mouth daily. Restart on 3/14 30 tablet   . gabapentin (NEURONTIN) 100 MG capsule Take 100 mg by mouth 3 (three) times daily.    Marland Kitchen HYDROcodone-acetaminophen (NORCO) 7.5-325 MG tablet Take 1 tablet by mouth every 4 (four) hours as needed for moderate pain (Must last 30 days.  Do not drive or operate machinery while taking this medicine.). 120 tablet 0  . insulin detemir (LEVEMIR) 100 UNIT/ML injection Inject 0.25 mLs (25 Units total) into the skin daily.    . naproxen (NAPROSYN) 500 MG tablet Take 500 mg by mouth 2 (two) times daily with a meal.     . pantoprazole (PROTONIX) 40 MG tablet Take 1 tablet (40 mg total) by mouth 2 (two) times daily before a meal. 180 tablet 3  . pioglitazone (  ACTOS) 45 MG tablet Take 45 mg by mouth daily.     . potassium chloride (MICRO-K) 10 MEQ CR capsule Take 1 capsule (10 mEq total) by mouth daily. Restart on 3/14    . SPIRIVA RESPIMAT 2.5 MCG/ACT AERS Inhale 1 puff into the lungs daily.     Marland Kitchen albuterol (PROVENTIL HFA;VENTOLIN HFA) 108 (90 BASE) MCG/ACT inhaler Inhale 2 puffs into the lungs every 4 (four) hours as needed for wheezing or shortness of breath.    Marland Kitchen albuterol (PROVENTIL) (2.5 MG/3ML) 0.083% nebulizer solution Take 3 mLs (2.5 mg total) by nebulization every 6 (six) hours as needed for wheezing or shortness of breath. 75 mL 12  . citalopram (CELEXA) 40 MG tablet Take 0.5 tablets (20 mg total) by mouth daily. (Patient not  taking: Reported on 11/30/2015) 30 tablet 1  . metoprolol tartrate (LOPRESSOR) 25 MG tablet Take 0.5 tablets (12.5 mg total) by mouth 2 (two) times daily. (Patient not taking: Reported on 12/11/2015) 30 tablet 1    Results for orders placed or performed during the hospital encounter of 12/21/2015 (from the past 48 hour(s))  Comprehensive metabolic panel     Status: Abnormal   Collection Time: 12/02/2015 10:11 AM  Result Value Ref Range   Sodium 133 (L) 135 - 145 mmol/L   Potassium 5.0 3.5 - 5.1 mmol/L   Chloride 103 101 - 111 mmol/L   CO2 19 (L) 22 - 32 mmol/L   Glucose, Bld 132 (H) 65 - 99 mg/dL   BUN 80 (H) 6 - 20 mg/dL   Creatinine, Ser 4.16 (H) 0.61 - 1.24 mg/dL   Calcium 8.7 (L) 8.9 - 10.3 mg/dL   Total Protein 7.4 6.5 - 8.1 g/dL   Albumin 3.1 (L) 3.5 - 5.0 g/dL   AST 16 15 - 41 U/L   ALT 10 (L) 17 - 63 U/L   Alkaline Phosphatase 63 38 - 126 U/L   Total Bilirubin 0.8 0.3 - 1.2 mg/dL   GFR calc non Af Amer 12 (L) >60 mL/min   GFR calc Af Amer 14 (L) >60 mL/min    Comment: (NOTE) The eGFR has been calculated using the CKD EPI equation. This calculation has not been validated in all clinical situations. eGFR's persistently <60 mL/min signify possible Chronic Kidney Disease.    Anion gap 11 5 - 15  CBC with Differential     Status: Abnormal   Collection Time: 12/03/2015 10:11 AM  Result Value Ref Range   WBC 13.6 (H) 4.0 - 10.5 K/uL   RBC 3.40 (L) 4.22 - 5.81 MIL/uL   Hemoglobin 10.1 (L) 13.0 - 17.0 g/dL   HCT 30.5 (L) 39.0 - 52.0 %   MCV 89.7 78.0 - 100.0 fL   MCH 29.7 26.0 - 34.0 pg   MCHC 33.1 30.0 - 36.0 g/dL   RDW 13.8 11.5 - 15.5 %   Platelets 205 150 - 400 K/uL   Neutrophils Relative % 80 %   Neutro Abs 10.9 (H) 1.7 - 7.7 K/uL   Lymphocytes Relative 10 %   Lymphs Abs 1.4 0.7 - 4.0 K/uL   Monocytes Relative 10 %   Monocytes Absolute 1.3 (H) 0.1 - 1.0 K/uL   Eosinophils Relative 0 %   Eosinophils Absolute 0.0 0.0 - 0.7 K/uL   Basophils Relative 0 %   Basophils  Absolute 0.0 0.0 - 0.1 K/uL   Ct Abdomen Pelvis Wo Contrast  11/27/2015  CLINICAL DATA:  Has not been feeling well, abdominal pain and  vomiting starting 2 days ago, history hypertension, hyperlipidemia, type II diabetes mellitus, coronary artery disease, COPD, history peptic ulcer disease, former smoker EXAM: CT ABDOMEN AND PELVIS WITHOUT CONTRAST TECHNIQUE: Multidetector CT imaging of the abdomen and pelvis was performed following the standard protocol without IV contrast. Sagittal and coronal MPR images reconstructed from axial data set. Oral contrast was not administered. COMPARISON:  11/15/2013, 01/30/2013 FINDINGS: Lower chest:  Bibasilar atelectasis greater on RIGHT Hepatobiliary: Dependent layered high attenuation material within gallbladder unchanged from previous exams question layered tiny gallstones versus milk of calcium. No gross gallbladder wall thickening. Liver unremarkable. No biliary dilatation. Pancreas: Normal appearance Spleen: Calcified granulomata within spleen. Otherwise normal appearance Adrenals/Urinary Tract: Adrenal glands normal appearance. BILATERAL renal cortical atrophy without mass or hydronephrosis. Beam hardening artifacts in pelvis partially obscure the bladder and distal ureters. Visualized portions of the ureters and bladder are unremarkable. Stomach/Bowel: Appendix surgically absent by history. Extraluminal gas and mild infiltrative changes identified in RIGHT upper quadrant adjacent to the hepatic flexure of the colon and in close proximity to the proximal duodenum, which appears thickened. Free intraperitoneal air is seen perihepatic and throughout the upper abdomen with a few foci in the lesser sac as well. Findings consistent with perforated viscus, favor perforated duodenal ulcer ; alhtough the adjacent hepatic flexure of the colon does not appear significantly thickened, colonic perforation not excluded as source. Portion of periduodenal edema extends to near the  pancreatic head though this does not appear to represent the epicenter of the process. Remaining bowel loops unremarkable. Vascular/Lymphatic: Atherosclerotic calcifications aorta and iliac arteries including origins of the celiac artery and significantly the proximal SMA. Significant plaque also seen at the origins of the renal arteries bilaterally. Aneurysmal dilatation of the common iliac arteries 20 mm diameter RIGHT and 17 mm LEFT with note of a 17 mm diameter LEFT internal iliac artery. No adenopathy. Reproductive: N/A Other: Free air as above. No significant free fluid. No definite hernia. Musculoskeletal: Bones demineralized with note of a LEFT hip prosthesis. Degenerative disc disease changes thoracolumbar spine with S accompanying facet degenerative changes at the lumbar region. AP spinal stenosis L3-L4 and to lesser degrees at L4-L5 and L5-S1, multifactorial. IMPRESSION: Free intraperitoneal air with additional extraluminal gas within fat in the RIGHT upper quadrant adjacent to the duodenum and hepatic flexure of colon, favor perforated duodenal ulcer due to associated duodenal wall thickening though colonic perforation is not completely excluded. Aortic atherosclerosis including origins of abdominal vessels and extending into iliac arteries as above. Findings discussed with Dr. Gilford Raid on 12/02/2015 at 1229 hours. Electronically Signed   By: Lavonia Dana M.D.   On: 11/27/2015 12:31    Review of Systems  Constitutional: Positive for malaise/fatigue.  HENT: Negative.   Eyes: Negative.   Respiratory: Negative.   Cardiovascular: Negative.   Gastrointestinal: Positive for abdominal pain.  Genitourinary: Negative.   Musculoskeletal: Negative.   Skin: Negative.   Neurological: Positive for weakness.  Endo/Heme/Allergies: Negative.     Blood pressure 135/59, pulse 98, temperature 98.3 F (36.8 C), temperature source Oral, resp. rate 13, height 6' 2"  (1.88 m), weight 127.007 kg (280 lb), SpO2 94  %. Physical Exam  Constitutional: He is oriented to person, place, and time.  Obese white male in no acute distress.  HENT:  Head: Normocephalic and atraumatic.  Neck: Normal range of motion. Neck supple.  Cardiovascular: Normal rate, regular rhythm and normal heart sounds.   Respiratory: Effort normal and breath sounds normal. No respiratory distress. He has  no wheezes.  GI: He exhibits distension. He exhibits no mass. There is tenderness. There is rebound.  Neurological: He is oriented to person, place, and time.  Skin: Skin is warm and dry. He is not diaphoretic.     Assessment/Plan Impression: Perforated viscus, probable perforated peptic ulcer Plan: Patient will be taken emergently to the operating room for an exploratory laparotomy, probable gastrorrhaphy. The risks and benefits of the procedure including bleeding, infection, cardiopulmonary difficulties, worsening renal insufficiency, and the possibility of death were fully explained to the patient and wife, who gave informed consent. He will be admitted to the intensive care unit.  Jamesetta So, MD 11/28/2015, 1:35 PM

## 2015-12-04 NOTE — ED Provider Notes (Signed)
CSN: [id number]     Arrival date & time December 20, 2015  1001 History  By signing my name below, I, [name], attest that this documentation has been prepared under the direction and in the presence of Jacalyn Lefevre, MD. Electronically Signed: Placido Orr, ED Scribe. 2015-12-20. 10:10 AM.   Chief Complaint  Patient presents with  . Abdominal Pain   The history is provided by the patient. No language interpreter was used.    HPI Comments: [name] is a 82 y.o. male with a PMHx of CKD and morbid obesity and a SHx including appendectomy who presents to the Emergency Department by ambulance complaining of moderate, RLQ abd pain onset earlier this morning. Pt reports his pain radiates to his right flank and worsens with palpation of the region. He reports associated n/v. Per EMS, his BP was 98/60 mmHg upon arrival, pt was given a 500 bolus with an increase in BP (172/90 mmHg in triage). Pt denies any other associated symptoms at this time.   Past Medical History  Diagnosis Date  . Hypertension   . Hyperlipidemia   . Diabetes mellitus, type II, insulin dependent ([facility])   . Coronary atherosclerosis of native coronary artery 2003    Multivessel s/p CABG  . Morbid obesity ([facility])   . GERD (gastroesophageal reflux disease)   . History of tobacco abuse   . Back pain   . Hard of hearing   . Aortic root dilatation ([facility])   . CKD (chronic kidney disease) stage 4, GFR 15-29 ml/min [facility])    Past Surgical History  Procedure Laterality Date  . Appendectomy    . Lumbar disc surgery      x2  . Coronary artery bypass graft  June 2003    LIMA to LAD, SVG to diagonal, sequential SVG to obtuse marginal and circumf SVG to RCAl  . Angioplasty    . Colonoscopy w/ polypectomy    . Skin cancer excision    . Hip arthroplasty Left 07/25/2013    Procedure: ARTHROPLASTY BIPOLAR HIP;  Surgeon: Darreld Mclean, MD;  Location: [facility];  Service: Orthopedics;  Laterality: Left;  . Esophagogastroduodenoscopy  N/A 06/07/2015    Dr. Fields:large antral ulcer/nodular gastritis and mild duodenitis. Negative H.pylori  . Cardiac surgery     Family History  Problem Relation Age of Onset  . Heart disease Father   . Heart attack Child    Social History  Substance Use Topics  . Smoking status: Former Smoker -- 1.00 packs/day for 60 years    Types: Cigarettes    Quit date: 05/25/2000  . Smokeless tobacco: Never Used  . Alcohol Use: No    Review of Systems  Gastrointestinal: Positive for nausea, vomiting and abdominal pain.  Genitourinary: Positive for flank pain.  All other systems reviewed and are negative.   Allergies  Review of patient's allergies indicates no known allergies.  Home Medications   Prior to Admission medications   Medication Sig Start Date End Date Taking? Authorizing Provider  aspirin EC 81 MG tablet Take 81 mg by mouth daily.   Yes Historical Provider, MD  atorvastatin (LIPITOR) 20 MG tablet TAKE 1 TABLET BY MOUTH AT BEDTIME FOR CHOLESTEROL. 02/15/14  Yes Rollene Rotunda, MD  furosemide (LASIX) 20 MG tablet Take 2 tablets (40 mg total) by mouth daily. Restart on 3/14 08/01/15  Yes Erick Blinks, MD  gabapentin (NEURONTIN) 100 MG capsule Take 100 mg by mouth 3 (three) times daily.   Yes Historical Provider, MD  HYDROcodone-acetaminophen (NORCO) 7.5-325 MG tablet Take 1 tablet by mouth every 4 (four) hours as needed for moderate pain (Must last 30 days.  Do not drive or operate machinery while taking this medicine.). 11/19/15  Yes Darreld Mclean, MD  insulin detemir (LEVEMIR) 100 UNIT/ML injection Inject 0.25 mLs (25 Units total) into the skin daily. 07/30/13  Yes Standley Brooking, MD  naproxen (NAPROSYN) 500 MG tablet Take 500 mg by mouth 2 (two) times daily with a meal.  07/16/15  Yes Historical Provider, MD  pantoprazole (PROTONIX) 40 MG tablet Take 1 tablet (40 mg total) by mouth 2 (two) times daily before a meal. 08/12/15  Yes Nira Retort, NP  pioglitazone (ACTOS) 45 MG tablet  Take 45 mg by mouth daily.  05/22/15  Yes Historical Provider, MD  potassium chloride (MICRO-K) 10 MEQ CR capsule Take 1 capsule (10 mEq total) by mouth daily. Restart on 3/14 08/01/15  Yes Erick Blinks, MD  SPIRIVA RESPIMAT 2.5 MCG/ACT AERS Inhale 1 puff into the lungs daily.  03/07/14  Yes Historical Provider, MD  albuterol (PROVENTIL HFA;VENTOLIN HFA) 108 (90 BASE) MCG/ACT inhaler Inhale 2 puffs into the lungs every 4 (four) hours as needed for wheezing or shortness of breath.    Historical Provider, MD  albuterol (PROVENTIL) (2.5 MG/3ML) 0.083% nebulizer solution Take 3 mLs (2.5 mg total) by nebulization every 6 (six) hours as needed for wheezing or shortness of breath. 08/01/15   Erick Blinks, MD  citalopram (CELEXA) 40 MG tablet Take 0.5 tablets (20 mg total) by mouth daily. Patient not taking: Reported on 11/23/2015 06/10/15   Hollice Espy, MD  metoprolol tartrate (LOPRESSOR) 25 MG tablet Take 0.5 tablets (12.5 mg total) by mouth 2 (two) times daily. Patient not taking: Reported on 11/29/2015 08/01/15   Erick Blinks, MD   BP 148/75 mmHg  Pulse 90  Temp(Src) 98.3 F (36.8 C) (Oral)  Resp 22  Ht 6\' 2"  (1.88 m)  Wt 280 lb (127.007 kg)  BMI 35.93 kg/m2  SpO2 95%    Physical Exam  Constitutional: He is oriented to person, place, and time. He appears well-developed and well-nourished.  HENT:  Head: Normocephalic and atraumatic.  Eyes: EOM are normal.  Neck: Normal range of motion.  Cardiovascular: Normal rate.   Pulmonary/Chest: Effort normal. No respiratory distress.  Abdominal: Soft. There is tenderness in the right lower quadrant. There is CVA tenderness.  TTP of RLQ and right flank  Musculoskeletal: Normal range of motion.  Neurological: He is alert and oriented to person, place, and time.  Skin: Skin is warm and dry.  Psychiatric: He has a normal mood and affect.  Nursing note and vitals reviewed.   ED Course  Procedures  DIAGNOSTIC STUDIES: Oxygen Saturation is 99%  on RA, normal by my interpretation.    COORDINATION OF CARE: 10:08 AM Discussed next steps with pt. Pt verbalized understanding and is agreeable with the plan.   Labs Review Labs Reviewed  COMPREHENSIVE METABOLIC PANEL - Abnormal; Notable for the following:    Sodium 133 (*)    CO2 19 (*)    Glucose, Bld 132 (*)    BUN 80 (*)    Creatinine, Ser 4.16 (*)    Calcium 8.7 (*)    Albumin 3.1 (*)    ALT 10 (*)    GFR calc non Af Amer 12 (*)    GFR calc Af Amer 14 (*)    All other components within normal limits  CBC WITH DIFFERENTIAL/PLATELET -  Abnormal; Notable for the following:    WBC 13.6 (*)    RBC 3.40 (*)    Hemoglobin 10.1 (*)    HCT 30.5 (*)    Neutro Abs 10.9 (*)    Monocytes Absolute 1.3 (*)    All other components within normal limits  URINALYSIS, ROUTINE W REFLEX MICROSCOPIC (NOT AT [facility])    Imaging Review Ct Abdomen Pelvis Wo Contrast  11/24/2015  CLINICAL DATA:  Has not been feeling well, abdominal pain and vomiting starting 2 days ago, history hypertension, hyperlipidemia, type II diabetes mellitus, coronary artery disease, COPD, history peptic ulcer disease, former smoker EXAM: CT ABDOMEN AND PELVIS WITHOUT CONTRAST TECHNIQUE: Multidetector CT imaging of the abdomen and pelvis was performed following the standard protocol without IV contrast. Sagittal and coronal MPR images reconstructed from axial data set. Oral contrast was not administered. COMPARISON:  11/15/2013, 01/30/2013 FINDINGS: Lower chest:  Bibasilar atelectasis greater on RIGHT Hepatobiliary: Dependent layered high attenuation material within gallbladder unchanged from previous exams question layered tiny gallstones versus milk of calcium. No gross gallbladder wall thickening. Liver unremarkable. No biliary dilatation. Pancreas: Normal appearance Spleen: Calcified granulomata within spleen. Otherwise normal appearance Adrenals/Urinary Tract: Adrenal glands normal appearance. BILATERAL renal cortical atrophy  without mass or hydronephrosis. Beam hardening artifacts in pelvis partially obscure the bladder and distal ureters. Visualized portions of the ureters and bladder are unremarkable. Stomach/Bowel: Appendix surgically absent by history. Extraluminal gas and mild infiltrative changes identified in RIGHT upper quadrant adjacent to the hepatic flexure of the colon and in close proximity to the proximal duodenum, which appears thickened. Free intraperitoneal air is seen perihepatic and throughout the upper abdomen with a few foci in the lesser sac as well. Findings consistent with perforated viscus, favor perforated duodenal ulcer ; alhtough the adjacent hepatic flexure of the colon does not appear significantly thickened, colonic perforation not excluded as source. Portion of periduodenal edema extends to near the pancreatic head though this does not appear to represent the epicenter of the process. Remaining bowel loops unremarkable. Vascular/Lymphatic: Atherosclerotic calcifications aorta and iliac arteries including origins of the celiac artery and significantly the proximal SMA. Significant plaque also seen at the origins of the renal arteries bilaterally. Aneurysmal dilatation of the common iliac arteries 20 mm diameter RIGHT and 17 mm LEFT with note of a 17 mm diameter LEFT internal iliac artery. No adenopathy. Reproductive: N/A Other: Free air as above. No significant free fluid. No definite hernia. Musculoskeletal: Bones demineralized with note of a LEFT hip prosthesis. Degenerative disc disease changes thoracolumbar spine with S accompanying facet degenerative changes at the lumbar region. AP spinal stenosis L3-L4 and to lesser degrees at L4-L5 and L5-S1, multifactorial. IMPRESSION: Free intraperitoneal air with additional extraluminal gas within fat in the RIGHT upper quadrant adjacent to the duodenum and hepatic flexure of colon, favor perforated duodenal ulcer due to associated duodenal wall thickening  though colonic perforation is not completely excluded. Aortic atherosclerosis including origins of abdominal vessels and extending into iliac arteries as above. Findings discussed with Dr. Particia NearingHaviland on 12/17/2015 at 1229 hours. Electronically Signed   By: Ulyses SouthwardMark  Boles M.D.   On: 12/12/2015 12:31   I have personally reviewed and evaluated these images and lab results as part of my medical decision-making.   EKG Interpretation   Date/Time:  Wednesday December 04 2015 10:03:05 EDT Ventricular Rate:  80 PR Interval:    QRS Duration: 90 QT Interval:  364 QTC Calculation: 420 R Axis:   -3 Text Interpretation:  Sinus rhythm Ventricular premature complex Confirmed  by [facility] MD, Apryl Brymer ([id number]) on 11/27/2015 10:33:44 AM      MDM  Pt d/w Dr. Lovell Sheehan who will eval and admit patient. Final diagnoses:  Perforated ulcer of intestine ([facility])  Acute on chronic renal failure (H[facility])    I personally performed the services described in this documentation, which was scribed in my presence. The recorded information has been reviewed and is accurate.    Jacalyn Lefevre, MD 12/22/2015 1250

## 2015-12-04 NOTE — Op Note (Signed)
Patient:  [name]  DOB:  1934-05-09  MRN:  [id number]   Preop Diagnosis:  Perforated viscus  Postop Diagnosis:  Perforated duodenal ulcer  Procedure:  Exploratory laparotomy, gastrorrhaphy  Surgeon:  Franky MachoMark Gillermo Poch, M.D.  Anes:  Gen. endotracheal  Indications:  Patient is a 82 year old white male who presents with a 48 hour history of worsening abdominal pain. CT scan the abdomen reveals pneumoperitoneum with a significant amount of free air and fluid in the epigastric and right upper quadrant region, consistent with the gastroduodenal perforation. The risks and benefits of the procedure including bleeding, infection, cardiopulmonary difficulties, and the possibility of death were fully explained to the patient, who gave informed consent.  Procedure note:  The patient was placed the supine position. After induction of general endotracheal anesthesia, the abdomen was prepped and draped using the usual sterile technique with DuraPrep. Surgical site confirmation was performed.  A midline incision was made from the subxiphoid process to the umbilicus. The peroneal cavity was entered into without difficulty. Cloudy bloody fluid was noted in the right upper quadrant. The perforation was noted along the superior anterior aspect of the first portion of the duodenum. The right upper quadrant was copiously irrigated with normal saline. The perforation was approximately 5-7 mm in size. It was oversewn using 3-0 silk sutures. Surrounding omentum was then placed over this region as a Graham plication. A #10 flat Jackson-Pratt drain was placed into the right upper quadrant over the repair and brought out through separate stab wound inferior to the incision line. The JP drain was secured in place using 3-0 nylon interrupted suture. The abdominal cavity was then copiously irrigated with normal saline. The fascia was reapproximated using a loop 0 Novafil running suture. Subcutaneous layer was irrigated  with normal saline and skin was closed using staples. Betadine ointment and dry sterile dressings were applied.   All tape and needle counts were correct at the end of the procedure. The patient was x-rayed in the operating room and transferred to PACU in guarded condition.   Complications:  None  EBL:  50 mL  Specimen:  None  Drains: JP drain to right upper quadrant

## 2015-12-04 NOTE — Anesthesia Preprocedure Evaluation (Addendum)
Anesthesia Evaluation  Patient identified by MRN, date of birth, ID band Patient awake    Reviewed: Allergy & Precautions, H&P , NPO status , Patient's Chart, lab work & pertinent test results  Airway Mallampati: III  TM Distance: >3 FB Neck ROM: Full    Dental  (+) Teeth Intact, Dental Advisory Given   Pulmonary COPD, former smoker,    breath sounds clear to auscultation       Cardiovascular hypertension, Pt. on medications + CAD, + CABG and + Peripheral Vascular Disease   Rhythm:Regular Rate:Normal     Neuro/Psych    GI/Hepatic PUD, GERD  Controlled and Medicated,  Endo/Other  diabetes, Type 2, Insulin Dependent, Oral Hypoglycemic AgentsMorbid obesity  Renal/GU CRFRenal disease     Musculoskeletal   Abdominal   Peds  Hematology   Anesthesia Other Findings   Reproductive/Obstetrics                            Anesthesia Physical Anesthesia Plan  ASA: III  Anesthesia Plan: General   Post-op Pain Management:    Induction: Intravenous, Rapid sequence and Cricoid pressure planned  Airway Management Planned: Oral ETT and Video Laryngoscope Planned  Additional Equipment:   Intra-op Plan:   Post-operative Plan: Extubation in OR  Informed Consent: I have reviewed the patients History and Physical, chart, labs and discussed the procedure including the risks, benefits and alternatives for the proposed anesthesia with the patient or authorized representative who has indicated his/her understanding and acceptance.     Plan Discussed with:   Anesthesia Plan Comments: (amidate induction )       Anesthesia Quick Evaluation

## 2015-12-04 NOTE — Anesthesia Procedure Notes (Signed)
Procedure Name: Intubation Date/Time: 12/23/2015 2:19 PM Performed by: Pernell DupreADAMS, Andrick Rust A Pre-anesthesia Checklist: Timeout performed, Patient identified, Emergency Drugs available, Suction available and Patient being monitored Patient Re-evaluated:Patient Re-evaluated prior to inductionOxygen Delivery Method: Circle system utilized Preoxygenation: Pre-oxygenation with 100% oxygen Intubation Type: IV induction and Cricoid Pressure applied Ventilation: Mask ventilation without difficulty Laryngoscope Size: Glidescope and 3 Grade View: Grade II Tube type: Oral Tube size: 7.0 mm Number of attempts: 1 Airway Equipment and Method: Video-laryngoscopy Placement Confirmation: positive ETCO2 and breath sounds checked- equal and bilateral Secured at: 23 cm Tube secured with: Tape Dental Injury: Teeth and Oropharynx as per pre-operative assessment

## 2015-12-04 NOTE — ED Notes (Signed)
Pt was undressed fully. With clothing and belongings sent with wife. Pt had numerous keys which were also sent with his wife.

## 2015-12-04 NOTE — ED Notes (Signed)
Family at bedside. 

## 2015-12-04 NOTE — ED Notes (Signed)
Pt taken to the OR. Arnoldo Morale met patient at bedside.

## 2015-12-04 NOTE — Anesthesia Preprocedure Evaluation (Deleted)
Anesthesia Evaluation    Airway        Dental   Pulmonary former smoker,           Cardiovascular hypertension,      Neuro/Psych    GI/Hepatic   Endo/Other  diabetes  Renal/GU      Musculoskeletal   Abdominal   Peds  Hematology   Anesthesia Other Findings   Reproductive/Obstetrics                             Anesthesia Physical Anesthesia Plan Anesthesia Quick Evaluation  

## 2015-12-05 LAB — BASIC METABOLIC PANEL
ANION GAP: 10 (ref 5–15)
BUN: 80 mg/dL — ABNORMAL HIGH (ref 6–20)
CALCIUM: 8.6 mg/dL — AB (ref 8.9–10.3)
CO2: 19 mmol/L — ABNORMAL LOW (ref 22–32)
Chloride: 108 mmol/L (ref 101–111)
Creatinine, Ser: 4.19 mg/dL — ABNORMAL HIGH (ref 0.61–1.24)
GFR, EST AFRICAN AMERICAN: 14 mL/min — AB (ref >60)
GFR, EST NON AFRICAN AMERICAN: 12 mL/min — AB (ref >60)
Glucose, Bld: 146 mg/dL — ABNORMAL HIGH (ref 65–99)
Potassium: 6.3 mmol/L (ref 3.5–5.1)
Sodium: 137 mmol/L (ref 135–145)

## 2015-12-05 LAB — MRSA PCR SCREENING: MRSA BY PCR: NEGATIVE

## 2015-12-05 LAB — CBC
HCT: 27.7 % — ABNORMAL LOW (ref 39.0–52.0)
Hemoglobin: 8.9 g/dL — ABNORMAL LOW (ref 13.0–17.0)
MCH: 29.9 pg (ref 26.0–34.0)
MCHC: 32.1 g/dL (ref 30.0–36.0)
MCV: 93 fL (ref 78.0–100.0)
PLATELETS: 204 10*3/uL (ref 150–400)
RBC: 2.98 MIL/uL — ABNORMAL LOW (ref 4.22–5.81)
RDW: 14.2 % (ref 11.5–15.5)
WBC: 10.6 10*3/uL — AB (ref 4.0–10.5)

## 2015-12-05 LAB — SODIUM, URINE, RANDOM: Sodium, Ur: 45 mmol/L

## 2015-12-05 LAB — POTASSIUM: Potassium: 6.4 mmol/L (ref 3.5–5.1)

## 2015-12-05 LAB — CREATININE, URINE, RANDOM: Creatinine, Urine: 155.41 mg/dL

## 2015-12-05 LAB — MAGNESIUM: MAGNESIUM: 1.5 mg/dL — AB (ref 1.7–2.4)

## 2015-12-05 LAB — PHOSPHORUS: PHOSPHORUS: 6.1 mg/dL — AB (ref 2.5–4.6)

## 2015-12-05 MED ORDER — PIPERACILLIN-TAZOBACTAM IN DEX 2-0.25 GM/50ML IV SOLN
2.2500 g | Freq: Three times a day (TID) | INTRAVENOUS | Status: DC
  Filled 2015-12-05: qty 50

## 2015-12-05 MED ORDER — FAMOTIDINE IN NACL 20-0.9 MG/50ML-% IV SOLN
20.0000 mg | INTRAVENOUS | Status: DC
Start: 2015-12-06 — End: 2015-12-09
  Administered 2015-12-06 – 2015-12-09 (×4): 20 mg via INTRAVENOUS
  Filled 2015-12-05 (×5): qty 50

## 2015-12-05 MED ORDER — FUROSEMIDE 10 MG/ML IJ SOLN
40.0000 mg | Freq: Two times a day (BID) | INTRAMUSCULAR | Status: AC
  Administered 2015-12-05 (×2): 40 mg via INTRAVENOUS
  Filled 2015-12-05 (×2): qty 4

## 2015-12-05 MED ORDER — MAGNESIUM SULFATE 2 GM/50ML IV SOLN
2.0000 g | Freq: Once | INTRAVENOUS | Status: AC
  Administered 2015-12-05: 2 g via INTRAVENOUS
  Filled 2015-12-05: qty 50

## 2015-12-05 MED ORDER — PIPERACILLIN SOD-TAZOBACTAM SO 2.25 (2-0.25) G IV SOLR
2.2500 g | Freq: Three times a day (TID) | INTRAVENOUS | Status: DC
  Administered 2015-12-05 – 2015-12-08 (×9): 2.25 g via INTRAVENOUS
  Filled 2015-12-05 (×13): qty 2.25

## 2015-12-05 NOTE — Consult Note (Signed)
Patsey BertholdWalter B Peach MRN: [id number] DOB/AGE: 80-26-1935 80 y.o. Primary Care Physician:MEYERS, Jeannett SeniorSTEPHEN, MD Admit date: 12/20/2015 Chief Complaint:  Chief Complaint  Patient presents with  . Abdominal Pain   HPI: Pt is 82 year old male with past medica hx of CAD, S/p CABG, DM type 2 who presented to Er with c/o abdominal pain.  HPI dates back to 2-3 days ago when started having pain in abdomen, progressive in nature so pt came to ER. Upon evaluation in ER pt was found to have perforated viscus and was admitted for further care . Pt later under went exploratory laparotomy and gastrorrhaphy. Pt seen today in ICU Pt today offers no c/o chest pain No c/o dyspnea No c/o fever/cough/chills  NO c/o hematuria  no c/o freq /urgency/dysuria.    Past Medical History  Diagnosis Date  . Hypertension   . Hyperlipidemia   . Diabetes mellitus, type II, insulin dependent (HCC)   . Coronary atherosclerosis of native coronary artery 2003    Multivessel s/p CABG  . Morbid obesity (HCC)   . GERD (gastroesophageal reflux disease)   . History of tobacco abuse   . Back pain   . Hard of hearing   . Aortic root dilatation (HCC)   . CKD (chronic kidney disease) stage 4, GFR 15-29 ml/min (HCC)         Family History  Problem Relation Age of Onset  . Heart disease Father   . Heart attack Child     Social History:  reports that he quit smoking about 15 years ago. His smoking use included Cigarettes. He has a 60 pack-year smoking history. He has never used smokeless tobacco. He reports that he does not drink alcohol or use illicit drugs.   Allergies: No Known Allergies  Medications Prior to Admission  Medication Sig Dispense Refill  . aspirin EC 81 MG tablet Take 81 mg by mouth daily.    Marland Kitchen. atorvastatin (LIPITOR) 20 MG tablet TAKE 1 TABLET BY MOUTH AT BEDTIME FOR CHOLESTEROL. 30 tablet 0  . furosemide (LASIX) 20 MG tablet Take 2 tablets (40 mg total) by mouth daily. Restart on 3/14 30 tablet    . gabapentin (NEURONTIN) 100 MG capsule Take 100 mg by mouth 3 (three) times daily.    Marland Kitchen. HYDROcodone-acetaminophen (NORCO) 7.5-325 MG tablet Take 1 tablet by mouth every 4 (four) hours as needed for moderate pain (Must last 30 days.  Do not drive or operate machinery while taking this medicine.). 120 tablet 0  . insulin detemir (LEVEMIR) 100 UNIT/ML injection Inject 0.25 mLs (25 Units total) into the skin daily.    . naproxen (NAPROSYN) 500 MG tablet Take 500 mg by mouth 2 (two) times daily with a meal.     . pantoprazole (PROTONIX) 40 MG tablet Take 1 tablet (40 mg total) by mouth 2 (two) times daily before a meal. 180 tablet 3  . pioglitazone (ACTOS) 45 MG tablet Take 45 mg by mouth daily.     . potassium chloride (MICRO-K) 10 MEQ CR capsule Take 1 capsule (10 mEq total) by mouth daily. Restart on 3/14    . SPIRIVA RESPIMAT 2.5 MCG/ACT AERS Inhale 1 puff into the lungs daily.     Marland Kitchen. albuterol (PROVENTIL HFA;VENTOLIN HFA) 108 (90 BASE) MCG/ACT inhaler Inhale 2 puffs into the lungs every 4 (four) hours as needed for wheezing or shortness of breath.    Marland Kitchen. albuterol (PROVENTIL) (2.5 MG/3ML) 0.083% nebulizer solution Take 3 mLs (2.5 mg total) by nebulization every  6 (six) hours as needed for wheezing or shortness of breath. 75 mL 12  . citalopram (CELEXA) 40 MG tablet Take 0.5 tablets (20 mg total) by mouth daily. (Patient not taking: Reported on 12/16/2015) 30 tablet 1  . metoprolol tartrate (LOPRESSOR) 25 MG tablet Take 0.5 tablets (12.5 mg total) by mouth 2 (two) times daily. (Patient not taking: Reported on 12/16/15) 30 tablet 1       NUU:VOZDG from the symptoms mentioned above,there are no other symptoms referable to all systems reviewed.  . enoxaparin (LOVENOX) injection  30 mg Subcutaneous Q24H  . famotidine (PEPCID) IV  20 mg Intravenous Q12H  . magnesium sulfate 1 - 4 g bolus IVPB  2 g Intravenous Once  . piperacillin-tazobactam (ZOSYN)  IV  2.25 g Intravenous Q8H      Physical  Exam: Vital signs in last 24 hours: Temp:  [97 F (36.1 C)-98.3 F (36.8 C)] 97.1 F (36.2 C) (07/13 0400) Pulse Rate:  [79-106] 89 (07/13 0800) Resp:  [10-30] 12 (07/13 0800) BP: (86-166)/(58-98) 115/65 mmHg (07/13 0800) SpO2:  [90 %-100 %] 94 % (07/13 0800) FiO2 (%):  [50 %] 50 % (07/13 0851) Weight:  [280 lb (127.007 kg)-284 lb 9.8 oz (129.1 kg)] 284 lb 9.8 oz (129.1 kg) (07/13 0500) Weight change:     Intake/Output from previous day: 07/12 0701 - 07/13 0700 In: 2704.2 [I.V.:2604.2; IV Piggyback:100] Out: 1050 [Urine:500; Drains:100; Blood:50]     Physical Exam: General- pt is awake,alert, but confused HEENT-NG i situ Resp- No acute REsp distress, BS decreased at bases CVS- S1S2 regular in rate and rhythm GIT- BS+ but slow,distended EXT- trace  LE Edema, NO Cyanosis CNS- CN 2-12 grossly intact.     Lab Results: CBC  Recent Labs  12/16/2015 1011 12/05/15 0442  WBC 13.6* 10.6*  HGB 10.1* 8.9*  HCT 30.5* 27.7*  PLT 205 204    BMET  Recent Labs  12-16-2015 1011 12/05/15 0442 12/05/15 0658  NA 133* 137  --   K 5.0 6.3* 6.4*  CL 103 108  --   CO2 19* 19*  --   GLUCOSE 132* 146*  --   BUN 80* 80*  --   CREATININE 4.16* 4.19*  --   CALCIUM 8.7* 8.6*  --    Trend Creat  2017  4.1     ( Baseline 2.7--3.1) 2015  2.9--3.2 2014  2.2 2011  1.78 2010  1.6--2.58   MICRO No results found for this or any previous visit (from the past 240 hour(s)).    Lab Results  Component Value Date   PTH 144.3* 07/26/2013   CALCIUM 8.6* 12/05/2015   PHOS 6.1* 12/05/2015   CT scan renal -BILATERAL enal cortical atrophy without mass or hydronephrosis.    Impression: 1)Renal  AKI secondary to Multiple factors                    Prerenal and ATN from  Hypotension                      Meds- pt had  NSAIDS on board as outpt               AKI on CKD                CKD stage 4 .               CKD since 2010  CKD secondary to age asociated decline/HTN/  DM/ Morbid obesty/obstruction ( pt has hx of nephrolithiasis)                2)CVSP stable  Pt was hypotensive earlier  3)Anemia HGb low Sec to GI perforation  4)CKD Mineral-Bone Disorder PTH high Secondary Hyperparathyroidism present Phosphorus not at goal Calcium is at goal.  5)GI- admitted with perforation now s/p gastrorrhaphy.   6)Electrolytes  Hyperkalemic    Sec to PO kcl as outpt + AKI and CKD as baseline  NOrmonatremic   7)Acid base Co2 not  at goal     Plan:  Will ask for FENA Will give lasix after collection for urine Will follow K If K not better, will discuss dialysis Cannot give kayexalate as recent GI operation     Cordarius Benning S 12/05/2015, 9:05 AM

## 2015-12-05 NOTE — Progress Notes (Signed)
Critical potassium of 6.3 this morning called by lab. MD text paged results.

## 2015-12-05 NOTE — Progress Notes (Signed)
1 Day Post-Op  Subjective: Mild confusion overnight. Knows he had surgery yesterday.  Objective: Vital signs in last 24 hours: Temp:  [97 F (36.1 C)-98.3 F (36.8 C)] 97.1 F (36.2 C) (07/13 0400) Pulse Rate:  [79-106] 92 (07/13 0600) Resp:  [10-30] 10 (07/13 0600) BP: (86-166)/(58-98) 108/61 mmHg (07/13 0600) SpO2:  [90 %-100 %] 97 % (07/13 0600) FiO2 (%):  [50 %] 50 % (07/12 1700) Weight:  [127.007 kg (280 lb)-129.1 kg (284 lb 9.8 oz)] 129.1 kg (284 lb 9.8 oz) (07/13 0500)    Intake/Output from previous day: 07/12 0701 - 07/13 0700 In: 2704.2 [I.V.:2604.2; IV Piggyback:100] Out: 1050 [Urine:500; Drains:100; Blood:50] Intake/Output this shift:    General appearance: cooperative, appears stated age and no distress Head: Normocephalic, without obvious abnormality, atraumatic Resp: clear to auscultation bilaterally Cardio: regular rate and rhythm, S1, S2 normal, no murmur, click, rub or gallop GI: Soft, serosanguineous drainage from JP drain. Dressing dry and intact. Minimal bowel sounds appreciated.  Lab Results:   Recent Labs  12-05-15 1011 12/05/15 0442  WBC 13.6* 10.6*  HGB 10.1* 8.9*  HCT 30.5* 27.7*  PLT 205 204   BMET  Recent Labs  2015-12-05 1011 12/05/15 0442 12/05/15 0658  NA 133* 137  --   K 5.0 6.3* 6.4*  CL 103 108  --   CO2 19* 19*  --   GLUCOSE 132* 146*  --   BUN 80* 80*  --   CREATININE 4.16* 4.19*  --   CALCIUM 8.7* 8.6*  --    PT/INR No results for input(s): LABPROT, INR in the last 72 hours.  Studies/Results: Ct Abdomen Pelvis Wo Contrast  12-05-2015  CLINICAL DATA:  Has not been feeling well, abdominal pain and vomiting starting 2 days ago, history hypertension, hyperlipidemia, type II diabetes mellitus, coronary artery disease, COPD, history peptic ulcer disease, former smoker EXAM: CT ABDOMEN AND PELVIS WITHOUT CONTRAST TECHNIQUE: Multidetector CT imaging of the abdomen and pelvis was performed following the standard protocol without  IV contrast. Sagittal and coronal MPR images reconstructed from axial data set. Oral contrast was not administered. COMPARISON:  11/15/2013, 01/30/2013 FINDINGS: Lower chest:  Bibasilar atelectasis greater on RIGHT Hepatobiliary: Dependent layered high attenuation material within gallbladder unchanged from previous exams question layered tiny gallstones versus milk of calcium. No gross gallbladder wall thickening. Liver unremarkable. No biliary dilatation. Pancreas: Normal appearance Spleen: Calcified granulomata within spleen. Otherwise normal appearance Adrenals/Urinary Tract: Adrenal glands normal appearance. BILATERAL renal cortical atrophy without mass or hydronephrosis. Beam hardening artifacts in pelvis partially obscure the bladder and distal ureters. Visualized portions of the ureters and bladder are unremarkable. Stomach/Bowel: Appendix surgically absent by history. Extraluminal gas and mild infiltrative changes identified in RIGHT upper quadrant adjacent to the hepatic flexure of the colon and in close proximity to the proximal duodenum, which appears thickened. Free intraperitoneal air is seen perihepatic and throughout the upper abdomen with a few foci in the lesser sac as well. Findings consistent with perforated viscus, favor perforated duodenal ulcer ; alhtough the adjacent hepatic flexure of the colon does not appear significantly thickened, colonic perforation not excluded as source. Portion of periduodenal edema extends to near the pancreatic head though this does not appear to represent the epicenter of the process. Remaining bowel loops unremarkable. Vascular/Lymphatic: Atherosclerotic calcifications aorta and iliac arteries including origins of the celiac artery and significantly the proximal SMA. Significant plaque also seen at the origins of the renal arteries bilaterally. Aneurysmal dilatation of the common iliac arteries  20 mm diameter RIGHT and 17 mm LEFT with note of a 17 mm diameter LEFT  internal iliac artery. No adenopathy. Reproductive: N/A Other: Free air as above. No significant free fluid. No definite hernia. Musculoskeletal: Bones demineralized with note of a LEFT hip prosthesis. Degenerative disc disease changes thoracolumbar spine with S accompanying facet degenerative changes at the lumbar region. AP spinal stenosis L3-L4 and to lesser degrees at L4-L5 and L5-S1, multifactorial. IMPRESSION: Free intraperitoneal air with additional extraluminal gas within fat in the RIGHT upper quadrant adjacent to the duodenum and hepatic flexure of colon, favor perforated duodenal ulcer due to associated duodenal wall thickening though colonic perforation is not completely excluded. Aortic atherosclerosis including origins of abdominal vessels and extending into iliac arteries as above. Findings discussed with Dr. Particia NearingHaviland on 03-04-16 at 1229 hours. Electronically Signed   By: Ulyses SouthwardMark  Boles M.D.   On: 03-04-16 12:31    Anti-infectives: Anti-infectives    Start     Dose/Rate Route Frequency Ordered Stop   June 24, 2015 1800  piperacillin-tazobactam (ZOSYN) IVPB 3.375 g     3.375 g 12.5 mL/hr over 240 Minutes Intravenous Every 8 hours June 24, 2015 1707     June 24, 2015 1245  Ampicillin-Sulbactam (UNASYN) 3 g in sodium chloride 0.9 % 100 mL IVPB     3 g 100 mL/hr over 60 Minutes Intravenous  Once June 24, 2015 1236 June 24, 2015 1329      Assessment/Plan: s/p Procedure(s): EXPLORATORY LAPAROTOMY GASTRORRHAPHY Impression: Stable on postoperative day 1. Does have hypokalemia secondary to renal insufficiency. Patient is nonoliguric. Will get nephrology consult. Hypomagnesemia noted and being treated. Continue monitoring urine output with Foley catheter.  LOS: 1 day    [name] A 12/05/2015

## 2015-12-05 NOTE — Addendum Note (Signed)
Addendum  created 12/05/15 0739 by Earleen NewportAmy A Coleby Yett, CRNA   Modules edited: Clinical Notes   Clinical Notes:  File: [id number]

## 2015-12-05 NOTE — Anesthesia Postprocedure Evaluation (Signed)
Anesthesia Post Note  Patient: [name]  Procedure(s) Performed: Procedure(s) (LRB): EXPLORATORY LAPAROTOMY (N/A) GASTRORRHAPHY (N/A)  Patient location during evaluation: ICU Anesthesia Type: General Level of consciousness: awake and alert Pain management: pain level controlled Vital Signs Assessment: post-procedure vital signs reviewed and stable Respiratory status: spontaneous breathing and respiratory function stable Cardiovascular status: stable Postop Assessment: no signs of nausea or vomiting Anesthetic complications: no    Last Vitals:  Filed Vitals:   12/05/15 0500 12/05/15 0600  BP: 104/67 108/61  Pulse: 92 92  Temp:    Resp: 14 10    Last Pain:  Filed Vitals:   12/05/15 0619  PainSc: Asleep                 Layani Foronda A

## 2015-12-05 NOTE — Progress Notes (Signed)
Pharmacy Antibiotic Note  [name] is a 82 y.o. male admitted on Sep 14, 2015 with intr-abdominal infection.  Pharmacy has been consulted for zosyn  dosing. CT scan the abdomen revealed perforated viscus, consistent with perforated duodenal ulcer. Normalized CrCl = 9215ml/min. Scr consistent with previous scr, therefore will adjust dose and use normalized crcl.  Plan: Change zosyn 2.25gm IV q8h F/U plan and cultures Monitor labs and renal function  Height: 6\' 2"  (188 cm) Weight: 284 lb 9.8 oz (129.1 kg) IBW/kg (Calculated) : 82.2  Temp (24hrs), Avg:97.6 F (36.4 C), Min:97 F (36.1 C), Max:98.3 F (36.8 C)   Recent Labs Lab 2016-05-17 1011 12/05/15 0442  WBC 13.6* 10.6*  CREATININE 4.16* 4.19*    Estimated Creatinine Clearance: 20.4 mL/min (by C-G formula based on Cr of 4.19).    No Known Allergies  Antimicrobials this admission: Zosyn 7/12 >>  Unasyn x 1 dose 7/12  Dose adjustments this admission:   Microbiology results:   Thank you for allowing pharmacy to be a part of this patient's care. Elder CyphersLorie Angelita Harnack, BS Pharm D, BCPS Clinical Pharmacist Pager [phone]  12/05/2015 8:00 AM

## 2015-12-06 LAB — BASIC METABOLIC PANEL
ANION GAP: 9 (ref 5–15)
BUN: 89 mg/dL — ABNORMAL HIGH (ref 6–20)
CO2: 19 mmol/L — AB (ref 22–32)
CREATININE: 4.01 mg/dL — AB (ref 0.61–1.24)
Calcium: 8.8 mg/dL — ABNORMAL LOW (ref 8.9–10.3)
Chloride: 110 mmol/L (ref 101–111)
GFR calc non Af Amer: 13 mL/min — ABNORMAL LOW (ref >60)
GFR, EST AFRICAN AMERICAN: 15 mL/min — AB (ref >60)
Glucose, Bld: 126 mg/dL — ABNORMAL HIGH (ref 65–99)
POTASSIUM: 5.8 mmol/L — AB (ref 3.5–5.1)
Sodium: 138 mmol/L (ref 135–145)

## 2015-12-06 LAB — CBC
HEMATOCRIT: 26.8 % — AB (ref 39.0–52.0)
HEMOGLOBIN: 8.6 g/dL — AB (ref 13.0–17.0)
MCH: 29.6 pg (ref 26.0–34.0)
MCHC: 32.1 g/dL (ref 30.0–36.0)
MCV: 92.1 fL (ref 78.0–100.0)
Platelets: 211 10*3/uL (ref 150–400)
RBC: 2.91 MIL/uL — AB (ref 4.22–5.81)
RDW: 14.1 % (ref 11.5–15.5)
WBC: 11.2 10*3/uL — AB (ref 4.0–10.5)

## 2015-12-06 LAB — PHOSPHORUS: Phosphorus: 6.2 mg/dL — ABNORMAL HIGH (ref 2.5–4.6)

## 2015-12-06 LAB — MAGNESIUM: Magnesium: 2 mg/dL (ref 1.7–2.4)

## 2015-12-06 MED ORDER — CETYLPYRIDINIUM CHLORIDE 0.05 % MT LIQD
7.0000 mL | Freq: Two times a day (BID) | OROMUCOSAL | Status: DC
  Administered 2015-12-07 – 2015-12-18 (×23): 7 mL via OROMUCOSAL

## 2015-12-06 MED ORDER — CHLORHEXIDINE GLUCONATE 0.12 % MT SOLN
15.0000 mL | Freq: Two times a day (BID) | OROMUCOSAL | Status: DC
  Administered 2015-12-07 – 2015-12-17 (×22): 15 mL via OROMUCOSAL
  Filled 2015-12-06 (×19): qty 15

## 2015-12-06 MED ORDER — FUROSEMIDE 10 MG/ML IJ SOLN
40.0000 mg | Freq: Once | INTRAMUSCULAR | Status: AC
  Administered 2015-12-06: 40 mg via INTRAVENOUS
  Filled 2015-12-06: qty 4

## 2015-12-06 MED ORDER — SODIUM CHLORIDE 0.9 % IV SOLN
INTRAVENOUS | Status: DC
  Administered 2015-12-06 (×2): via INTRAVENOUS

## 2015-12-06 NOTE — Care Management (Signed)
Pt confused and not appropriate for assessment at this time. CM will cont to follow for DC planning needs.

## 2015-12-06 NOTE — Progress Notes (Signed)
2 Days Post-Op  Subjective: Patient appears fatigued and groggy. He is arousable and states he is in no pain.  Objective: Vital signs in last 24 hours: Temp:  [97 F (36.1 C)-98.7 F (37.1 C)] 98 F (36.7 C) (07/14 0400) Pulse Rate:  [79-99] 88 (07/14 0800) Resp:  [11-24] 15 (07/14 0800) BP: (109-156)/(56-120) 137/76 mmHg (07/14 0800) SpO2:  [90 %-99 %] 94 % (07/14 0800) FiO2 (%):  [28 %-50 %] 28 % (07/14 0759) Weight:  [128.2 kg (282 lb 10.1 oz)] 128.2 kg (282 lb 10.1 oz) (07/14 0500)    Intake/Output from previous day: 07/13 0701 - 07/14 0700 In: 2475.4 [I.V.:2375.4; IV Piggyback:100] Out: 2450 [Urine:2050; Emesis/NG output:360; Drains:40] Intake/Output this shift: Total I/O In: 301.7 [I.V.:301.7] Out: -   General appearance: cooperative and fatigued Resp: clear to auscultation bilaterally Cardio: regular rate and rhythm, S1, S2 normal, no murmur, click, rub or gallop GI: Soft. Dressing dry and intact. Serosanguineous drainage noted in JP drain.  Lab Results:   Recent Labs  12/05/15 0442 12/06/15 0415  WBC 10.6* 11.2*  HGB 8.9* 8.6*  HCT 27.7* 26.8*  PLT 204 211   BMET  Recent Labs  12/05/15 0442 12/05/15 0658 12/06/15 0415  NA 137  --  138  K 6.3* 6.4* 5.8*  CL 108  --  110  CO2 19*  --  19*  GLUCOSE 146*  --  126*  BUN 80*  --  89*  CREATININE 4.19*  --  4.01*  CALCIUM 8.6*  --  8.8*   PT/INR No results for input(s): LABPROT, INR in the last 72 hours.  Studies/Results: Ct Abdomen Pelvis Wo Contrast  2015/12/29  CLINICAL DATA:  Has not been feeling well, abdominal pain and vomiting starting 2 days ago, history hypertension, hyperlipidemia, type II diabetes mellitus, coronary artery disease, COPD, history peptic ulcer disease, former smoker EXAM: CT ABDOMEN AND PELVIS WITHOUT CONTRAST TECHNIQUE: Multidetector CT imaging of the abdomen and pelvis was performed following the standard protocol without IV contrast. Sagittal and coronal MPR images  reconstructed from axial data set. Oral contrast was not administered. COMPARISON:  11/15/2013, 01/30/2013 FINDINGS: Lower chest:  Bibasilar atelectasis greater on RIGHT Hepatobiliary: Dependent layered high attenuation material within gallbladder unchanged from previous exams question layered tiny gallstones versus milk of calcium. No gross gallbladder wall thickening. Liver unremarkable. No biliary dilatation. Pancreas: Normal appearance Spleen: Calcified granulomata within spleen. Otherwise normal appearance Adrenals/Urinary Tract: Adrenal glands normal appearance. BILATERAL renal cortical atrophy without mass or hydronephrosis. Beam hardening artifacts in pelvis partially obscure the bladder and distal ureters. Visualized portions of the ureters and bladder are unremarkable. Stomach/Bowel: Appendix surgically absent by history. Extraluminal gas and mild infiltrative changes identified in RIGHT upper quadrant adjacent to the hepatic flexure of the colon and in close proximity to the proximal duodenum, which appears thickened. Free intraperitoneal air is seen perihepatic and throughout the upper abdomen with a few foci in the lesser sac as well. Findings consistent with perforated viscus, favor perforated duodenal ulcer ; alhtough the adjacent hepatic flexure of the colon does not appear significantly thickened, colonic perforation not excluded as source. Portion of periduodenal edema extends to near the pancreatic head though this does not appear to represent the epicenter of the process. Remaining bowel loops unremarkable. Vascular/Lymphatic: Atherosclerotic calcifications aorta and iliac arteries including origins of the celiac artery and significantly the proximal SMA. Significant plaque also seen at the origins of the renal arteries bilaterally. Aneurysmal dilatation of the common iliac arteries 20  mm diameter RIGHT and 17 mm LEFT with note of a 17 mm diameter LEFT internal iliac artery. No adenopathy.  Reproductive: N/A Other: Free air as above. No significant free fluid. No definite hernia. Musculoskeletal: Bones demineralized with note of a LEFT hip prosthesis. Degenerative disc disease changes thoracolumbar spine with S accompanying facet degenerative changes at the lumbar region. AP spinal stenosis L3-L4 and to lesser degrees at L4-L5 and L5-S1, multifactorial. IMPRESSION: Free intraperitoneal air with additional extraluminal gas within fat in the RIGHT upper quadrant adjacent to the duodenum and hepatic flexure of colon, favor perforated duodenal ulcer due to associated duodenal wall thickening though colonic perforation is not completely excluded. Aortic atherosclerosis including origins of abdominal vessels and extending into iliac arteries as above. Findings discussed with Dr. Particia NearingHaviland on 12/13/2015 at 1229 hours. Electronically Signed   By: Ulyses SouthwardMark  Boles M.D.   On: 11/25/2015 12:31    Anti-infectives: Anti-infectives    Start     Dose/Rate Route Frequency Ordered Stop   12/05/15 1400  piperacillin-tazobactam (ZOSYN) 2.25 g in dextrose 5 % 50 mL IVPB     2.25 g 100 mL/hr over 30 Minutes Intravenous Every 8 hours 12/05/15 0810     12/05/15 1000  piperacillin-tazobactam (ZOSYN) IVPB 2.25 g  Status:  Discontinued     2.25 g 100 mL/hr over 30 Minutes Intravenous Every 8 hours 12/05/15 0808 12/05/15 0810   12/17/2015 1800  piperacillin-tazobactam (ZOSYN) IVPB 3.375 g  Status:  Discontinued     3.375 g 12.5 mL/hr over 240 Minutes Intravenous Every 8 hours 12/17/2015 1707 12/05/15 0808   11/23/2015 1245  Ampicillin-Sulbactam (UNASYN) 3 g in sodium chloride 0.9 % 100 mL IVPB     3 g 100 mL/hr over 60 Minutes Intravenous  Once 12/22/2015 1236 12/03/2015 1329      Assessment/Plan: s/p Procedure(s): EXPLORATORY LAPAROTOMY GASTRORRHAPHY Impression: Nonoliguric renal failure, hyperkalemia resolving. Overall, patient still fatigued after surgery. He does wake up and try to pull monitoring equipment and NG  tube out. Family states he does sleep a lot at home.  Plan: Continue current therapy. May get NG tube out this weekend pending JP drain output.  LOS: 2 days    Shirlena Brinegar A 12/06/2015

## 2015-12-06 NOTE — Progress Notes (Signed)
Patient appeared to be in pain and became very agitated gave 1 mg of ativan and for pain did give dilaudid.

## 2015-12-06 NOTE — Progress Notes (Signed)
[name]  MRN: [id number]  DOB/AGE: [birth year] 82 y.o.  Primary Care Physician:MEYERS, Jeannett SeniorSTEPHEN, MD  Admit date: 08/01/15  Chief Complaint:  Chief Complaint  Patient presents with  . Abdominal Pain    S-Pt presented on  08/01/15 with  Chief Complaint  Patient presents with  . Abdominal Pain  .    Pt remains confused.Pt does not offer any complaints. Pt wife is present in the room, her main concern was " his swelling is better than yesterday".   . enoxaparin (LOVENOX) injection  30 mg Subcutaneous Q24H  . famotidine (PEPCID) IV  20 mg Intravenous Q24H  . piperacillin-tazobactam (ZOSYN)  IV  2.25 g Intravenous Q8H     Physical Exam: Vital signs in last 24 hours: Temp:  [97 F (36.1 C)-98.7 F (37.1 C)] 98 F (36.7 C) (07/14 0400) Pulse Rate:  [79-99] 88 (07/14 0800) Resp:  [11-24] 15 (07/14 0800) BP: (109-156)/(56-120) 137/76 mmHg (07/14 0800) SpO2:  [90 %-99 %] 94 % (07/14 0800) FiO2 (%):  [28 %-50 %] 28 % (07/14 0759) Weight:  [282 lb 10.1 oz (128.2 kg)] 282 lb 10.1 oz (128.2 kg) (07/14 0500) Weight change: 2 lb 10.1 oz (1.193 kg)    Intake/Output from previous day: 07/13 0701 - 07/14 0700 In: 2475.4 [I.V.:2375.4; IV Piggyback:100] Out: 2450 [Urine:2050; Emesis/NG output:360; Drains:40] Total I/O In: 301.7 [I.V.:301.7] Out: -    Physical Exam: General- pt is awake,but confused Resp- No acute REsp distress, decreased BS at bases CVS- S1S2 regular in rate and rhythm GIT- BS+ but slow, distended EXT- trace LE Edema,No Cyanosis   Lab Results: CBC  Recent Labs  12/05/15 0442 12/06/15 0415  WBC 10.6* 11.2*  HGB 8.9* 8.6*  HCT 27.7* 26.8*  PLT 204 211    BMET  Recent Labs  12/05/15 0442 12/05/15 0658 12/06/15 0415  NA 137  --  138  K 6.3* 6.4* 5.8*  CL 108  --  110  CO2 19*  --  19*  GLUCOSE 146*  --  126*  BUN 80*  --  89*  CREATININE 4.19*  --  4.01*  CALCIUM 8.6*  --  8.8*    Trend Creat  2017 4.0--4.1  ( Baseline  2.7--3.1) 2015 2.9--3.2 2014 2.2 2011 1.78 2010 1.6--2.58    MICRO Recent Results (from the past 240 hour(s))  MRSA PCR Screening     Status: None   Collection Time: 12/05/15 11:55 AM  Result Value Ref Range Status   MRSA by PCR NEGATIVE NEGATIVE Final    Comment:        The GeneXpert MRSA Assay (FDA approved for NASAL specimens only), is one component of a comprehensive MRSA colonization surveillance program. It is not intended to diagnose MRSA infection nor to guide or monitor treatment for MRSA infections.       Lab Results  Component Value Date   PTH 144.3* 07/26/2013   CALCIUM 8.8* 12/06/2015   PHOS 6.2* 12/06/2015               Impression: 1)Renal AKI secondary to Multiple factors  Prerenal and ATN from Hypotension  Meds- pt had NSAIDS on board as outpt  AKI on CKD  CKD stage 4 .  CKD since 2010  CKD secondary to age asociated decline/HTN/ DM/ Morbid obesty/obstruction ( pt has hx of nephrolithiasis)   2)CVSP stable  Pt was hypotensive earlier  3)Anemia HGb low Sec to GI perforation  4)CKD Mineral-Bone Disorder PTH high Secondary Hyperparathyroidism present Phosphorus  not at goal Calcium is at goal.  5)GI- admitted with perforation now s/p gastrorrhaphy.   6)Electrolytes  Hyperkalemic  Sec to PO kcl as outpt + AKI and CKD as baseline    Now better  NOrmonatremic   7)Acid base Co2 not at goal  Plan:  Will give 1 dose of lasix to help with Hyperkalemia     Donalee Gaumond S 12/06/2015, 9:36 AM

## 2015-12-07 LAB — BASIC METABOLIC PANEL
Anion gap: 8 (ref 5–15)
BUN: 80 mg/dL — ABNORMAL HIGH (ref 6–20)
CO2: 20 mmol/L — ABNORMAL LOW (ref 22–32)
Calcium: 8.9 mg/dL (ref 8.9–10.3)
Chloride: 112 mmol/L — ABNORMAL HIGH (ref 101–111)
Creatinine, Ser: 3.51 mg/dL — ABNORMAL HIGH (ref 0.61–1.24)
GFR calc Af Amer: 18 mL/min — ABNORMAL LOW (ref >60)
GFR calc non Af Amer: 15 mL/min — ABNORMAL LOW (ref >60)
Glucose, Bld: 140 mg/dL — ABNORMAL HIGH (ref 65–99)
Potassium: 5.2 mmol/L — ABNORMAL HIGH (ref 3.5–5.1)
Sodium: 140 mmol/L (ref 135–145)

## 2015-12-07 LAB — CBC
HCT: 30.3 % — ABNORMAL LOW (ref 39.0–52.0)
HEMOGLOBIN: 10 g/dL — AB (ref 13.0–17.0)
MCH: 30.1 pg (ref 26.0–34.0)
MCHC: 33 g/dL (ref 30.0–36.0)
MCV: 91.3 fL (ref 78.0–100.0)
PLATELETS: 212 10*3/uL (ref 150–400)
RBC: 3.32 MIL/uL — ABNORMAL LOW (ref 4.22–5.81)
RDW: 14.1 % (ref 11.5–15.5)
WBC: 12.7 10*3/uL — ABNORMAL HIGH (ref 4.0–10.5)

## 2015-12-07 MED ORDER — HYDROCODONE-ACETAMINOPHEN 7.5-325 MG PO TABS: 1.0000 | ORAL_TABLET | ORAL | Status: AC | PRN

## 2015-12-07 NOTE — Progress Notes (Signed)
Corey BertholdWalter B Orr  MRN: [id number]  DOB/AGE: Jan 20, 1934 82 y.o.  Primary Care Physician:MEYERS, Jeannett SeniorSTEPHEN, MD  Admit date: 12/08/2015  Chief Complaint:  Chief Complaint  Patient presents with  . Abdominal Pain    S-Pt presented on  12/07/2015 with  Chief Complaint  Patient presents with  . Abdominal Pain  .    Pt remains confused.Pt does not offer any complaints. Pt wife and three sons are present in the room .    They asked nephrology related questions. I answered the questions to best of my ability.   Meds . antiseptic oral rinse  7 mL Mouth Rinse q12n4p  . chlorhexidine  15 mL Mouth Rinse BID  . enoxaparin (LOVENOX) injection  30 mg Subcutaneous Q24H  . famotidine (PEPCID) IV  20 mg Intravenous Q24H  . piperacillin-tazobactam (ZOSYN)  IV  2.25 g Intravenous Q8H     Physical Exam: Vital signs in last 24 hours: Temp:  [97.2 F (36.2 C)-98.7 F (37.1 C)] 97.8 F (36.6 C) (07/15 0718) Pulse Rate:  [84-116] 88 (07/15 0600) Resp:  [11-17] 17 (07/15 0600) BP: (106-162)/(59-102) 134/63 mmHg (07/15 0600) SpO2:  [77 %-100 %] 99 % (07/15 0600) Weight change:  Last BM Date: 12/06/15  Intake/Output from previous day: 07/14 0701 - 07/15 0700 In: 3195.8 [I.V.:3045.8; IV Piggyback:150] Out: 5480 [Urine:4850; Emesis/NG output:400; Drains:230] Total I/O In: -  Out: 850 [Urine:850]   Physical Exam: General- pt is awake,but confused Resp- No acute REsp distress, decreased BS at bases CVS- S1S2 regular in rate and rhythm GIT- BS+ but slow, distended EXT- trace LE Edema,No Cyanosis   Lab Results: CBC  Recent Labs  12/06/15 0415 12/07/15 0904  WBC 11.2* 12.7*  HGB 8.6* 10.0*  HCT 26.8* 30.3*  PLT 211 212    BMET  Recent Labs  12/06/15 0415 12/07/15 0835  NA 138 140  K 5.8* 5.2*  CL 110 112*  CO2 19* 20*  GLUCOSE 126* 140*  BUN 89* 80*  CREATININE 4.01* 3.51*  CALCIUM 8.8* 8.9    Trend Creat  2017 4.0--4.1=>3.51  ( Baseline 2.7--3.1) 2015  2.9--3.2 2014 2.2 2011 1.78 2010 1.6--2.58    MICRO Recent Results (from the past 240 hour(s))  MRSA PCR Screening     Status: None   Collection Time: 12/05/15 11:55 AM  Result Value Ref Range Status   MRSA by PCR NEGATIVE NEGATIVE Final    Comment:        The GeneXpert MRSA Assay (FDA approved for NASAL specimens only), is one component of a comprehensive MRSA colonization surveillance program. It is not intended to diagnose MRSA infection nor to guide or monitor treatment for MRSA infections.       Lab Results  Component Value Date   PTH 144.3* 07/26/2013   CALCIUM 8.9 12/07/2015   PHOS 6.2* 12/06/2015               Impression: 1)Renal AKI secondary to Multiple factors  Prerenal and ATN from Hypotension  Meds- pt had NSAIDS on board as outpt  AKI on CKD  CKD stage 4 .  CKD since 2010  CKD secondary to age asociated decline/HTN/ DM/ Morbid obesty/obstruction ( pt has hx of nephrolithiasis)                 AKI now improving              2)CVS stable  Pt was hypotensive earlier  3)Anemia HGb now better Sec to GI perforation  4)CKD Mineral-Bone Disorder PTH high Secondary Hyperparathyroidism present Phosphorus not at goal Calcium is at goal.  5)GI- admitted with perforation now s/p gastrorrhaphy.   6)Electrolytes  Hyperkalemic  Sec to PO kcl as outpt + AKI and CKD as baseline    Now better  NOrmonatremic   7)Acid base Co2 not at goal  Plan:  Will continue current care    BHUTANI,MANPREET S 12/07/2015, 9:59 AM

## 2015-12-07 NOTE — Progress Notes (Signed)
3 Days Post-Op  Subjective: Is having episodes of grogginess. He was agitated earlier, requiring abdomen and pain medication. He is arousable. Family reports he has been starting to have evidence of Alzheimer's dementia.  Objective: Vital signs in last 24 hours: Temp:  [97.2 F (36.2 C)-98.7 F (37.1 C)] 97.8 F (36.6 C) (07/15 0718) Pulse Rate:  [84-116] 88 (07/15 0600) Resp:  [11-17] 17 (07/15 0600) BP: (106-162)/(59-102) 134/63 mmHg (07/15 0600) SpO2:  [77 %-100 %] 99 % (07/15 0600) Last BM Date: 12/06/15  Intake/Output from previous day: 07/14 0701 - 07/15 0700 In: 3195.8 [I.V.:3045.8; IV Piggyback:150] Out: 5480 [Urine:4850; Emesis/NG output:400; Drains:230] Intake/Output this shift: Total I/O In: -  Out: 850 [Urine:850]  General appearance: appears stated age, flushed and no distress Resp: clear to auscultation bilaterally Cardio: regular rate and rhythm, S1, S2 normal, no murmur, click, rub or gallop GI: Soft, dressing dry and intact. Bilious drainage noted in JP bulb today. No rigidity noted.  Lab Results:   Recent Labs  12/06/15 0415 12/07/15 0904  WBC 11.2* 12.7*  HGB 8.6* 10.0*  HCT 26.8* 30.3*  PLT 211 212   BMET  Recent Labs  12/06/15 0415 12/07/15 0835  NA 138 140  K 5.8* 5.2*  CL 110 112*  CO2 19* 20*  GLUCOSE 126* 140*  BUN 89* 80*  CREATININE 4.01* 3.51*  CALCIUM 8.8* 8.9   PT/INR No results for input(s): LABPROT, INR in the last 72 hours.  Studies/Results: No results found.  Anti-infectives: Anti-infectives    Start     Dose/Rate Route Frequency Ordered Stop   12/05/15 1400  piperacillin-tazobactam (ZOSYN) 2.25 g in dextrose 5 % 50 mL IVPB     2.25 g 100 mL/hr over 30 Minutes Intravenous Every 8 hours 12/05/15 0810     12/05/15 1000  piperacillin-tazobactam (ZOSYN) IVPB 2.25 g  Status:  Discontinued     2.25 g 100 mL/hr over 30 Minutes Intravenous Every 8 hours 12/05/15 0808 12/05/15 0810   12/11/2015 1800  piperacillin-tazobactam  (ZOSYN) IVPB 3.375 g  Status:  Discontinued     3.375 g 12.5 mL/hr over 240 Minutes Intravenous Every 8 hours 12/01/2015 1707 12/05/15 0808   12/17/2015 1245  Ampicillin-Sulbactam (UNASYN) 3 g in sodium chloride 0.9 % 100 mL IVPB     3 g 100 mL/hr over 60 Minutes Intravenous  Once 12/12/2015 1236 12/03/2015 1329      Assessment/Plan: s/p Procedure(s): EXPLORATORY LAPAROTOMY GASTRORRHAPHY Impression: Renal insufficiency improving. Nonoliguric. Is having some bilious drainage from JP drain. We'll monitor. Leave an NG tube in. We'll leave Foley and to monitor urine output. Adjust IV fluids.  LOS: 3 days    Jobin Montelongo A 12/07/2015

## 2015-12-08 LAB — CBC
HCT: 26 % — ABNORMAL LOW (ref 39.0–52.0)
Hemoglobin: 8.5 g/dL — ABNORMAL LOW (ref 13.0–17.0)
MCH: 29.6 pg (ref 26.0–34.0)
MCHC: 32.7 g/dL (ref 30.0–36.0)
MCV: 90.6 fL (ref 78.0–100.0)
Platelets: 217 10*3/uL (ref 150–400)
RBC: 2.87 MIL/uL — ABNORMAL LOW (ref 4.22–5.81)
RDW: 14.1 % (ref 11.5–15.5)
WBC: 9.6 10*3/uL (ref 4.0–10.5)

## 2015-12-08 LAB — BASIC METABOLIC PANEL
Anion gap: 7 (ref 5–15)
BUN: 73 mg/dL — ABNORMAL HIGH (ref 6–20)
CALCIUM: 8.9 mg/dL (ref 8.9–10.3)
CO2: 23 mmol/L (ref 22–32)
CREATININE: 2.92 mg/dL — AB (ref 0.61–1.24)
Chloride: 112 mmol/L — ABNORMAL HIGH (ref 101–111)
GFR, EST AFRICAN AMERICAN: 22 mL/min — AB (ref >60)
GFR, EST NON AFRICAN AMERICAN: 19 mL/min — AB (ref >60)
Glucose, Bld: 169 mg/dL — ABNORMAL HIGH (ref 65–99)
Potassium: 5 mmol/L (ref 3.5–5.1)
SODIUM: 142 mmol/L (ref 135–145)

## 2015-12-08 LAB — MAGNESIUM: MAGNESIUM: 1.9 mg/dL (ref 1.7–2.4)

## 2015-12-08 MED ORDER — BISACODYL 10 MG RE SUPP
10.0000 mg | Freq: Two times a day (BID) | RECTAL | Status: DC
  Administered 2015-12-08 – 2015-12-11 (×8): 10 mg via RECTAL
  Filled 2015-12-08 (×7): qty 1

## 2015-12-08 MED ORDER — PIPERACILLIN-TAZOBACTAM 3.375 G IVPB
3.3750 g | Freq: Three times a day (TID) | INTRAVENOUS | Status: DC
  Administered 2015-12-08 – 2015-12-16 (×24): 3.375 g via INTRAVENOUS
  Filled 2015-12-08 (×21): qty 50

## 2015-12-08 NOTE — Progress Notes (Signed)
4 Days Post-Op  Subjective: Patient resting comfortably. Easily arousable.  Objective: Vital signs in last 24 hours: Temp:  [97.2 F (36.2 C)-99.3 F (37.4 C)] 99 F (37.2 C) (07/16 1128) Pulse Rate:  [83-96] 83 (07/16 1100) Resp:  [10-20] 11 (07/16 1100) BP: (129-161)/(64-86) 141/68 mmHg (07/16 0900) SpO2:  [95 %-98 %] 96 % (07/16 1100) Weight:  [122.5 kg (270 lb 1 oz)] 122.5 kg (270 lb 1 oz) (07/16 0500) Last BM Date: 12/06/15  Intake/Output from previous day: 07/15 0701 - 07/16 0700 In: 2560 [I.V.:1735; NG/GT:600; IV Piggyback:200] Out: 3440 [Urine:3200; Drains:240] Intake/Output this shift: Total I/O In: 537.5 [I.V.:187.5; Other:50; NG/GT:300] Out: 995 [Urine:900; Drains:95]  General appearance: cooperative, appears stated age and no distress Resp: clear to auscultation bilaterally and Has some secretions in the back of the throat where he coughs up. Cardio: regular rate and rhythm, S1, S2 normal, no murmur, click, rub or gallop GI: Soft. Incision healing well. Bowel sounds present. All drainage with some bilious material present. NG tube in appropriate position. This was confirmed by auscultation.  Lab Results:   Recent Labs  12/07/15 0904 12/08/15 0126  WBC 12.7* 9.6  HGB 10.0* 8.5*  HCT 30.3* 26.0*  PLT 212 217   BMET  Recent Labs  12/07/15 0835 12/08/15 0126  NA 140 142  K 5.2* 5.0  CL 112* 112*  CO2 20* 23  GLUCOSE 140* 169*  BUN 80* 73*  CREATININE 3.51* 2.92*  CALCIUM 8.9 8.9   PT/INR No results for input(s): LABPROT, INR in the last 72 hours.  Studies/Results: No results found.  Anti-infectives: Anti-infectives    Start     Dose/Rate Route Frequency Ordered Stop   12/08/15 1400  piperacillin-tazobactam (ZOSYN) IVPB 3.375 g     3.375 g 12.5 mL/hr over 240 Minutes Intravenous Every 8 hours 12/08/15 0901     12/05/15 1400  piperacillin-tazobactam (ZOSYN) 2.25 g in dextrose 5 % 50 mL IVPB  Status:  Discontinued     2.25 g 100 mL/hr over  30 Minutes Intravenous Every 8 hours 12/05/15 0810 12/08/15 0900   12/05/15 1000  piperacillin-tazobactam (ZOSYN) IVPB 2.25 g  Status:  Discontinued     2.25 g 100 mL/hr over 30 Minutes Intravenous Every 8 hours 12/05/15 0808 12/05/15 0810   Mar 02, 2016 1800  piperacillin-tazobactam (ZOSYN) IVPB 3.375 g  Status:  Discontinued     3.375 g 12.5 mL/hr over 240 Minutes Intravenous Every 8 hours Mar 02, 2016 1707 12/05/15 0808   Mar 02, 2016 1245  Ampicillin-Sulbactam (UNASYN) 3 g in sodium chloride 0.9 % 100 mL IVPB     3 g 100 mL/hr over 60 Minutes Intravenous  Once Mar 02, 2016 1236 Mar 02, 2016 1329      Assessment/Plan: s/p Procedure(s): EXPLORATORY LAPAROTOMY GASTRORRHAPHY Impression: Renal function improving. Postoperative day 4. Duodenal perforation not fully sealed yet. Continue NG tube to decompression. Appreciate nephrology consultation. Continue IV antibiotics. Anemia secondary to chronic disease.  LOS: 4 days    Ceria Suminski A 12/08/2015

## 2015-12-08 NOTE — Progress Notes (Signed)
[name]  MRN: [id number]  DOB/AGE: 09-04-1933 82 y.o.  Primary Care Physician:MEYERS, Jeannett Senior, MD  Admit date: 12/10/2015  Chief Complaint:  Chief Complaint  Patient presents with  . Abdominal Pain    S-Pt presented on  12/11/2015 with  Chief Complaint  Patient presents with  . Abdominal Pain  .    Pt remains confused.Pt does not offer any complaints.    Discussed the issues with RN,pt is more more NG output.  Meds . antiseptic oral rinse  7 mL Mouth Rinse q12n4p  . chlorhexidine  15 mL Mouth Rinse BID  . enoxaparin (LOVENOX) injection  30 mg Subcutaneous Q24H  . famotidine (PEPCID) IV  20 mg Intravenous Q24H  . piperacillin-tazobactam (ZOSYN)  IV  2.25 g Intravenous Q8H     Physical Exam: Vital signs in last 24 hours: Temp:  [97.2 F (36.2 C)-99.3 F (37.4 C)] 99.3 F (37.4 C) (07/16 0811) Pulse Rate:  [84-96] 96 (07/16 0700) Resp:  [10-20] 15 (07/16 0700) BP: (121-161)/(64-91) 159/81 mmHg (07/16 0700) SpO2:  [95 %-99 %] 97 % (07/16 0812) Weight:  [270 lb 1 oz (122.5 kg)] 270 lb 1 oz (122.5 kg) (07/16 0500) Weight change:  Last BM Date: 12/06/15  Intake/Output from previous day: 07/15 0701 - 07/16 0700 In: 2560 [I.V.:1735; NG/GT:600; IV Piggyback:200] Out: 3440 [Urine:3200; Drains:240] Total I/O In: 537.5 [I.V.:187.5; Other:50; NG/GT:300] Out: 50 [Drains:50]   Physical Exam: General- pt is awake,but confused Resp- No acute REsp distress, decreased BS at bases CVS- S1S2 regular in rate and rhythm GIT- BS+ but slow, distended, JP drain in situ EXT- Noe LE Edema,No Cyanosis   Lab Results: CBC  Recent Labs  12/07/15 0904 12/08/15 0126  WBC 12.7* 9.6  HGB 10.0* 8.5*  HCT 30.3* 26.0*  PLT 212 217    BMET  Recent Labs  12/07/15 0835 12/08/15 0126  NA 140 142  K 5.2* 5.0  CL 112* 112*  CO2 20* 23  GLUCOSE 140* 169*  BUN 80* 73*  CREATININE 3.51* 2.92*  CALCIUM 8.9 8.9    Trend Creat  2017 4.0--4.1=>3.51=>2.92  (  Baseline 2.7--3.1) 2015 2.9--3.2 2014 2.2 2011 1.78 2010 1.6--2.58    MICRO Recent Results (from the past 240 hour(s))  MRSA PCR Screening     Status: None   Collection Time: 12/05/15 11:55 AM  Result Value Ref Range Status   MRSA by PCR NEGATIVE NEGATIVE Final    Comment:        The GeneXpert MRSA Assay (FDA approved for NASAL specimens only), is one component of a comprehensive MRSA colonization surveillance program. It is not intended to diagnose MRSA infection nor to guide or monitor treatment for MRSA infections.       Lab Results  Component Value Date   PTH 144.3* 07/26/2013   CALCIUM 8.9 12/08/2015   PHOS 6.2* 12/06/2015               Impression: 1)Renal AKI secondary to Multiple factors  Prerenal and ATN from Hypotension  Meds- pt had NSAIDS on board as outpt  AKI on CKD  CKD stage 4 .  CKD since 2010  CKD secondary to age asociated decline/HTN/ DM/ Morbid obesty/obstruction ( pt has hx of nephrolithiasis)                 AKI now improving              2)CVS stable  Pt was hypotensive earlier  3)Anemia HGb low Sec  to GI perforation Sx following for the need of transfusion   4)CKD Mineral-Bone Disorder PTH high Secondary Hyperparathyroidism present Phosphorus not at goal Calcium is at goal.  5)GI- admitted with perforation now s/p gastrorrhaphy.   6)Electrolytes  Hyperkalemic  Sec to PO kcl as outpt + AKI and CKD as baseline    Now better, back with normal limits  NOrmonatremic   7)Acid base Co2 now at goal  Plan:  Will increase IVF rate as increase NG output. Will follow bmet   Tynesha Free S 12/08/2015, 8:59 AM

## 2015-12-08 NOTE — Progress Notes (Signed)
Pharmacy Antibiotic Note  [name] is a 82 y.o. male admitted on 12/19/2015 with intr-abdominal infection.  Pharmacy has been consulted for zosyn  dosing. CT scan the abdomen revealed perforated viscus, consistent with perforated duodenal ulcer.  SCr improved.  Normalized clcr ~ 4921ml/min.  Plan: Change Zosyn to 3.375gm IV q8h F/U plan and cultures Monitor labs and renal function  Height: 6\' 2"  (188 cm) Weight: 270 lb 1 oz (122.5 kg) IBW/kg (Calculated) : 82.2  Temp (24hrs), Avg:98.8 F (37.1 C), Min:97.2 F (36.2 C), Max:99.3 F (37.4 C)   Recent Labs Lab 12/11/2015 1011 12/05/15 0442 12/06/15 0415 12/07/15 0835 12/07/15 0904 12/08/15 0126  WBC 13.6* 10.6* 11.2*  --  12.7* 9.6  CREATININE 4.16* 4.19* 4.01* 3.51*  --  2.92*    Estimated Creatinine Clearance: 28.1 mL/min (by C-G formula based on Cr of 2.92).    No Known Allergies  Antimicrobials this admission: Zosyn 7/12 >>  Unasyn x 1 dose 7/12  Recent Results (from the past 240 hour(s))  MRSA PCR Screening     Status: None   Collection Time: 12/05/15 11:55 AM  Result Value Ref Range Status   MRSA by PCR NEGATIVE NEGATIVE Final    Comment:        The GeneXpert MRSA Assay (FDA approved for NASAL specimens only), is one component of a comprehensive MRSA colonization surveillance program. It is not intended to diagnose MRSA infection nor to guide or monitor treatment for MRSA infections.    Thank you for allowing pharmacy to be a part of this patient's care.  Valrie HartScott Yajaira Doffing, PharmD Clinical Pharmacist Pager:  [phone] 12/08/2015 9:03 AM

## 2015-12-08 NOTE — Progress Notes (Signed)
CM spoke with staff regarding patient's needs. Patient not stable to communicate and no family at bedside.  Per family wants patient to discharge home and not a SNF to continue care.  CM was not able to contact spouse but was able to speak with daughter Kenn FileMary Norman [phone].  She will follow up with Ms. Labell regarding discharge.  Also confirmed family does not want patient placed in SNF prefers to have home health if needed.

## 2015-12-09 ENCOUNTER — Encounter (HOSPITAL_COMMUNITY): Payer: Self-pay | Admitting: General Surgery

## 2015-12-09 LAB — CBC
HCT: 29 % — ABNORMAL LOW (ref 39.0–52.0)
HEMOGLOBIN: 9.3 g/dL — AB (ref 13.0–17.0)
MCH: 29.4 pg (ref 26.0–34.0)
MCHC: 32.1 g/dL (ref 30.0–36.0)
MCV: 91.8 fL (ref 78.0–100.0)
Platelets: 231 10*3/uL (ref 150–400)
RBC: 3.16 MIL/uL — ABNORMAL LOW (ref 4.22–5.81)
RDW: 14.2 % (ref 11.5–15.5)
WBC: 10.5 10*3/uL (ref 4.0–10.5)

## 2015-12-09 LAB — BASIC METABOLIC PANEL
Anion gap: 10 (ref 5–15)
BUN: 58 mg/dL — ABNORMAL HIGH (ref 6–20)
CALCIUM: 9.5 mg/dL (ref 8.9–10.3)
CO2: 24 mmol/L (ref 22–32)
CREATININE: 2.26 mg/dL — AB (ref 0.61–1.24)
Chloride: 116 mmol/L — ABNORMAL HIGH (ref 101–111)
GFR calc non Af Amer: 26 mL/min — ABNORMAL LOW (ref >60)
GFR, EST AFRICAN AMERICAN: 30 mL/min — AB (ref >60)
GLUCOSE: 174 mg/dL — AB (ref 65–99)
Potassium: 4.9 mmol/L (ref 3.5–5.1)
SODIUM: 150 mmol/L — AB (ref 135–145)

## 2015-12-09 MED ORDER — PANTOPRAZOLE SODIUM 40 MG IV SOLR
40.0000 mg | Freq: Two times a day (BID) | INTRAVENOUS | Status: DC
  Administered 2015-12-09 – 2015-12-17 (×17): 40 mg via INTRAVENOUS
  Filled 2015-12-09 (×16): qty 40

## 2015-12-09 MED ORDER — SODIUM CHLORIDE 0.45 % IV SOLN
INTRAVENOUS | Status: AC
  Administered 2015-12-09 – 2015-12-11 (×4): via INTRAVENOUS

## 2015-12-09 MED ORDER — ENOXAPARIN SODIUM 60 MG/0.6ML ~~LOC~~ SOLN
60.0000 mg | SUBCUTANEOUS | Status: DC
  Administered 2015-12-10 – 2015-12-11 (×2): 60 mg via SUBCUTANEOUS
  Filled 2015-12-09 (×2): qty 0.6

## 2015-12-09 NOTE — Plan of Care (Signed)
REVIEWED OP NOTE. Pt presented WITH PERFORATED DUODENAL ULCER. PMHx: LARGE GASTRIC ULCER. NEEDS PROTONIX IV BID. DISCUSSED WITH LORI IN PHARMACY.

## 2015-12-09 NOTE — Care Management Important Message (Signed)
Important Message  Patient Details  Name: [name] MRN: [id number] Date of Birth: 1933-11-28   Medicare Important Message Given:  Yes    Fuller PlanWelborn, Tniya Bowditch M, RN 12/09/2015, 12:01 PM

## 2015-12-09 NOTE — Progress Notes (Signed)
5 Days Post-Op  Subjective: Patient easily arousable.  Objective: Vital signs in last 24 hours: Temp:  [98.5 F (36.9 C)-99.2 F (37.3 C)] 99 F (37.2 C) (07/17 0834) Pulse Rate:  [83-101] 93 (07/17 0700) Resp:  [10-32] 15 (07/17 0700) BP: (126-178)/(62-105) 167/79 mmHg (07/17 0700) SpO2:  [96 %-98 %] 97 % (07/17 0700) Weight:  [119 kg (262 lb 5.6 oz)] 119 kg (262 lb 5.6 oz) (07/17 0500) Last BM Date: 12/09/15  Intake/Output from previous day: 07/16 0701 - 07/17 0700 In: 2696.3 [I.V.:2171.3; NG/GT:300; IV Piggyback:175] Out: 4210 [Urine:2775; Emesis/NG output:1250; Drains:185] Intake/Output this shift:    General appearance: appears stated age and no distress Resp: clear to auscultation bilaterally Cardio: regular rate and rhythm, S1, S2 normal, no murmur, click, rub or gallop GI: Soft, decreased drainage noted in JVP, slightly bilious, but more clear than yesterday. Dressing dry and intact. Nontender.  Lab Results:   Recent Labs  12/08/15 0126 12/09/15 0444  WBC 9.6 10.5  HGB 8.5* 9.3*  HCT 26.0* 29.0*  PLT 217 231   BMET  Recent Labs  12/08/15 0126 12/09/15 0444  NA 142 150*  K 5.0 4.9  CL 112* 116*  CO2 23 24  GLUCOSE 169* 174*  BUN 73* 58*  CREATININE 2.92* 2.26*  CALCIUM 8.9 9.5   PT/INR No results for input(s): LABPROT, INR in the last 72 hours.  Studies/Results: No results found.  Anti-infectives: Anti-infectives    Start     Dose/Rate Route Frequency Ordered Stop   12/08/15 1400  piperacillin-tazobactam (ZOSYN) IVPB 3.375 g     3.375 g 12.5 mL/hr over 240 Minutes Intravenous Every 8 hours 12/08/15 0901     12/05/15 1400  piperacillin-tazobactam (ZOSYN) 2.25 g in dextrose 5 % 50 mL IVPB  Status:  Discontinued     2.25 g 100 mL/hr over 30 Minutes Intravenous Every 8 hours 12/05/15 0810 12/08/15 0900   12/05/15 1000  piperacillin-tazobactam (ZOSYN) IVPB 2.25 g  Status:  Discontinued     2.25 g 100 mL/hr over 30 Minutes Intravenous Every 8  hours 12/05/15 0808 12/05/15 0810   12/17/2015 1800  piperacillin-tazobactam (ZOSYN) IVPB 3.375 g  Status:  Discontinued     3.375 g 12.5 mL/hr over 240 Minutes Intravenous Every 8 hours 11/24/2015 1707 12/05/15 0808   12/17/2015 1245  Ampicillin-Sulbactam (UNASYN) 3 g in sodium chloride 0.9 % 100 mL IVPB     3 g 100 mL/hr over 60 Minutes Intravenous  Once 12/10/2015 1236 11/27/2015 1329      Assessment/Plan: s/p Procedure(s): EXPLORATORY LAPAROTOMY GASTRORRHAPHY Impression: Continuing to slowly improve. NG tube now working better in JP output has significantly decreased. White blood cell count is within normal limits. Appreciate nephrology input. Hopefully may DC NG tube in next 24-48 hours.  LOS: 5 days    Kreed Kauffman A 12/09/2015

## 2015-12-09 NOTE — Progress Notes (Addendum)
Subjective: Interval History: Patient is awake. His wife and son were with him. He doesn't offer any complaints.  Objective: Vital signs in last 24 hours: Temp:  [98.5 F (36.9 C)-99.2 F (37.3 C)] 99 F (37.2 C) (07/17 0834) Pulse Rate:  [83-101] 93 (07/17 0700) Resp:  [10-32] 15 (07/17 0700) BP: (126-178)/(62-105) 167/79 mmHg (07/17 0700) SpO2:  [96 %-98 %] 97 % (07/17 0700) Weight:  [119 kg (262 lb 5.6 oz)] 119 kg (262 lb 5.6 oz) (07/17 0500) Weight change: -3.5 kg (-7 lb 11.5 oz)  Intake/Output from previous day: 07/16 0701 - 07/17 0700 In: 2696.3 [I.V.:2171.3; NG/GT:300; IV Piggyback:175] Out: 4210 [Urine:2775; Emesis/NG output:1250; Drains:185] Intake/Output this shift:    General the patient is alert in no apparent distress Chest is clear to auscultation Heart exam revealed regular rate and rhythm Abdomen hypoactive Extremities no edema  Lab Results:  Recent Labs  12/08/15 0126 12/09/15 0444  WBC 9.6 10.5  HGB 8.5* 9.3*  HCT 26.0* 29.0*  PLT 217 231   BMET:  Recent Labs  12/08/15 0126 12/09/15 0444  NA 142 150*  K 5.0 4.9  CL 112* 116*  CO2 23 24  GLUCOSE 169* 174*  BUN 73* 58*  CREATININE 2.92* 2.26*  CALCIUM 8.9 9.5   No results for input(s): PTH in the last 72 hours. Iron Studies: No results for input(s): IRON, TIBC, TRANSFERRIN, FERRITIN in the last 72 hours.  Studies/Results: No results found.  I have reviewed the patient's current medications.  Assessment/Plan: Problem #1 acute kidney injury: Possibly prerenal versus ATN. His renal function is improving and his urine output is reasonable. Problem #2 hyperkalemia: Potassium has corrected Problem #3 hypernatremia: Most likely from lack of freewater. Presently is on normal saline Problem #4 history of anemia: His hemoglobin is slightly better today Problem #5 history of diabetes Problem #6 history of perforated duodenal ulcer status post surgery Problem #7 hypertension: His blood pressure  is reasonably controlled Plan: We'll change IV fluid to half normal saline at 100 mL per hour 2] we'll check his renal panel in the morning 3] we'll use normal saline   LOS: 5 days   Marvin Grabill S 12/09/2015,8:44 AM

## 2015-12-10 LAB — RENAL FUNCTION PANEL
ANION GAP: 7 (ref 5–15)
Albumin: 2.8 g/dL — ABNORMAL LOW (ref 3.5–5.0)
BUN: 55 mg/dL — ABNORMAL HIGH (ref 6–20)
CHLORIDE: 117 mmol/L — AB (ref 101–111)
CO2: 24 mmol/L (ref 22–32)
Calcium: 9.3 mg/dL (ref 8.9–10.3)
Creatinine, Ser: 2.05 mg/dL — ABNORMAL HIGH (ref 0.61–1.24)
GFR calc non Af Amer: 29 mL/min — ABNORMAL LOW (ref >60)
GFR, EST AFRICAN AMERICAN: 34 mL/min — AB (ref >60)
GLUCOSE: 182 mg/dL — AB (ref 65–99)
Phosphorus: 2.6 mg/dL (ref 2.5–4.6)
Potassium: 4.9 mmol/L (ref 3.5–5.1)
SODIUM: 148 mmol/L — AB (ref 135–145)

## 2015-12-10 NOTE — Progress Notes (Signed)
6 Days Post-Op  Subjective: No complaints.  Objective: Vital signs in last 24 hours: Temp:  [98 F (36.7 C)-99.9 F (37.7 C)] 98.1 F (36.7 C) (07/18 0750) Pulse Rate:  [81-112] 87 (07/18 0800) Resp:  [13-28] 17 (07/18 0800) BP: (137-185)/(72-109) 145/86 mmHg (07/18 0800) SpO2:  [88 %-100 %] 97 % (07/18 0800) Weight:  [119.2 kg (262 lb 12.6 oz)] 119.2 kg (262 lb 12.6 oz) (07/18 0500) Last BM Date: 12/09/15  Intake/Output from previous day: 07/17 0701 - 07/18 0700 In: 1726.7 [I.V.:1566.7; IV Piggyback:150] Out: 2100 [Urine:1700; Emesis/NG output:400] Intake/Output this shift:    General appearance: no distress Resp: clear to auscultation bilaterally Cardio: regular rate and rhythm, S1, S2 normal, no murmur, click, rub or gallop GI: soft, jp drainage mildly bilious, decreased.  NGT in place.  Lab Results:   Recent Labs  12/08/15 0126 12/09/15 0444  WBC 9.6 10.5  HGB 8.5* 9.3*  HCT 26.0* 29.0*  PLT 217 231   BMET  Recent Labs  12/09/15 0444 12/10/15 0432  NA 150* 148*  K 4.9 4.9  CL 116* 117*  CO2 24 24  GLUCOSE 174* 182*  BUN 58* 55*  CREATININE 2.26* 2.05*  CALCIUM 9.5 9.3   PT/INR No results for input(s): LABPROT, INR in the last 72 hours.  Studies/Results: No results found.  Anti-infectives: Anti-infectives    Start     Dose/Rate Route Frequency Ordered Stop   12/08/15 1400  piperacillin-tazobactam (ZOSYN) IVPB 3.375 g     3.375 g 12.5 mL/hr over 240 Minutes Intravenous Every 8 hours 12/08/15 0901     12/05/15 1400  piperacillin-tazobactam (ZOSYN) 2.25 g in dextrose 5 % 50 mL IVPB  Status:  Discontinued     2.25 g 100 mL/hr over 30 Minutes Intravenous Every 8 hours 12/05/15 0810 12/08/15 0900   12/05/15 1000  piperacillin-tazobactam (ZOSYN) IVPB 2.25 g  Status:  Discontinued     2.25 g 100 mL/hr over 30 Minutes Intravenous Every 8 hours 12/05/15 0808 12/05/15 0810   11/28/2015 1800  piperacillin-tazobactam (ZOSYN) IVPB 3.375 g  Status:   Discontinued     3.375 g 12.5 mL/hr over 240 Minutes Intravenous Every 8 hours 12/17/2015 1707 12/05/15 0808   12/03/2015 1245  Ampicillin-Sulbactam (UNASYN) 3 g in sodium chloride 0.9 % 100 mL IVPB     3 g 100 mL/hr over 60 Minutes Intravenous  Once 12/11/2015 1236 12/07/2015 1329      Assessment/Plan: s/p Procedure(s): EXPLORATORY LAPAROTOMY GASTRORRHAPHY Impression: Stable. No leukocytosis. Decreased JP drainage noted.  Plan: Continue current therapy. May repeat KUB or CT scan with IV contrast tomorrow to assess repair of duodenal perforation. Further management pending those results.  LOS: 6 days    Cleon Thoma A 12/10/2015

## 2015-12-10 NOTE — Progress Notes (Signed)
Subjective: Interval History: Patient is somnolent but arousable. Patient has this moment doesn't answer questions.  Objective: Vital signs in last 24 hours: Temp:  [98 F (36.7 C)-99.9 F (37.7 C)] 98 F (36.7 C) (07/18 0400) Pulse Rate:  [81-112] 85 (07/18 0700) Resp:  [13-28] 14 (07/18 0700) BP: (137-185)/(72-109) 148/76 mmHg (07/18 0700) SpO2:  [88 %-100 %] 96 % (07/18 0700) Weight:  [119.2 kg (262 lb 12.6 oz)] 119.2 kg (262 lb 12.6 oz) (07/18 0500) Weight change: 0.2 kg (7.1 oz)  Intake/Output from previous day: 07/17 0701 - 07/18 0700 In: 1726.7 [I.V.:1566.7; IV Piggyback:150] Out: 2100 [Urine:1700; Emesis/NG output:400] Intake/Output this shift:    Generally as stated above patient is very sleepy, arousable. Patient doesn't seem to verbalize today. Chest is clear to auscultation Heart exam revealed regular rate and rhythm Abdomen hypoactive Extremities no edema  Lab Results:  Recent Labs  12/08/15 0126 12/09/15 0444  WBC 9.6 10.5  HGB 8.5* 9.3*  HCT 26.0* 29.0*  PLT 217 231   BMET:   Recent Labs  12/09/15 0444 12/10/15 0432  NA 150* 148*  K 4.9 4.9  CL 116* 117*  CO2 24 24  GLUCOSE 174* 182*  BUN 58* 55*  CREATININE 2.26* 2.05*  CALCIUM 9.5 9.3   No results for input(s): PTH in the last 72 hours. Iron Studies: No results for input(s): IRON, TIBC, TRANSFERRIN, FERRITIN in the last 72 hours.  Studies/Results: No results found.  I have reviewed the patient's current medications.  Assessment/Plan: Problem #1 acute kidney injury: Possibly prerenal versus ATN. His renal function Continued to improve. Still his creatinine is above his baseline. He had 2100 mL of urine output. Problem #2 hyperkalemia: Potassium is normal. Problem #3 hypernatremia: Most likely from lack of freewater. His sodium is 148 and improving. Problem #4 history of anemia: His hemoglobin is slightly better today Problem #5 history of diabetes: His blood sugar is reasonably  controlled. Problem #6 history of perforated duodenal ulcer status post surgery Problem #7 hypertension: His blood pressure is reasonably controlled Plan: We'll continue with present management. 2] we'll check his renal panel in the morning   LOS: 6 days   Sharline Lehane S 12/10/2015,7:19 AM

## 2015-12-10 NOTE — Progress Notes (Signed)
Contact numbers for family:   Kenn FileMary Norman [phone] (daughter),  [phone] Jimmy(son),  [phone] Coralee RudWalter, Jr. (son)

## 2015-12-10 NOTE — Progress Notes (Signed)
Inpatient Diabetes Program Recommendations  AACE/ADA: New Consensus Statement on Inpatient Glycemic Control (2015)  Target Ranges:  Prepandial:   less than 140 mg/dL      Peak postprandial:   less than 180 mg/dL (1-2 hours)      Critically ill patients:  140 - 180 mg/dL  Results for [name], [name] (MRN [id number]) as of 12/10/2015 09:02  Ref. Range 12/08/2015 01:26 12/09/2015 04:44 12/10/2015 04:32  Glucose Latest Ref Range: 65-99 mg/dL 782169 (H) 956174 (H) 213182 (H)    Review of Glycemic Control  Diabetes history: DM2 Outpatient Diabetes medications: Actos 45 mg daily, Levemir 25 units daily Current orders for Inpatient glycemic control: None  Inpatient Diabetes Program Recommendations: Correction (SSI): Please consider ordering CBGs with Novolog correction scale Q4H. HgbA1C: Please consider ordering an A1C to evaluate glycemic control over the past 2-3 months.  Thanks, Orlando PennerMarie Kaeya Schiffer, RN, MSN, CDE Diabetes Coordinator Inpatient Diabetes Program [phone] (Team Pager from 8am to 5pm) [phone] ([place] office) [phone] [facility] office) [phone] [facility] office)

## 2015-12-11 LAB — CBC
HCT: 27 % — ABNORMAL LOW (ref 39.0–52.0)
Hemoglobin: 8.6 g/dL — ABNORMAL LOW (ref 13.0–17.0)
MCH: 29.7 pg (ref 26.0–34.0)
MCHC: 31.9 g/dL (ref 30.0–36.0)
MCV: 93.1 fL (ref 78.0–100.0)
Platelets: 225 10*3/uL (ref 150–400)
RBC: 2.9 MIL/uL — ABNORMAL LOW (ref 4.22–5.81)
RDW: 14.3 % (ref 11.5–15.5)
WBC: 11.4 10*3/uL — ABNORMAL HIGH (ref 4.0–10.5)

## 2015-12-11 LAB — GLUCOSE, CAPILLARY
Glucose-Capillary: 242 mg/dL — ABNORMAL HIGH (ref 65–99)
Glucose-Capillary: 225 mg/dL — ABNORMAL HIGH (ref 65–99)

## 2015-12-11 LAB — RENAL FUNCTION PANEL
ALBUMIN: 2.4 g/dL — AB (ref 3.5–5.0)
Anion gap: 11 (ref 5–15)
BUN: 66 mg/dL — AB (ref 6–20)
CALCIUM: 9.1 mg/dL (ref 8.9–10.3)
CO2: 24 mmol/L (ref 22–32)
CREATININE: 2.52 mg/dL — AB (ref 0.61–1.24)
Chloride: 116 mmol/L — ABNORMAL HIGH (ref 101–111)
GFR calc Af Amer: 26 mL/min — ABNORMAL LOW (ref >60)
GFR, EST NON AFRICAN AMERICAN: 23 mL/min — AB (ref >60)
GLUCOSE: 221 mg/dL — AB (ref 65–99)
PHOSPHORUS: 2.6 mg/dL (ref 2.5–4.6)
POTASSIUM: 4.6 mmol/L (ref 3.5–5.1)
SODIUM: 151 mmol/L — AB (ref 135–145)

## 2015-12-11 MED ORDER — INSULIN ASPART 100 UNIT/ML ~~LOC~~ SOLN
0.0000 [IU] | Freq: Three times a day (TID) | SUBCUTANEOUS | Status: DC
  Administered 2015-12-11: 5 [IU] via SUBCUTANEOUS
  Administered 2015-12-12: 8 [IU] via SUBCUTANEOUS

## 2015-12-11 NOTE — Progress Notes (Signed)
Inpatient Diabetes Program Recommendations  AACE/ADA: New Consensus Statement on Inpatient Glycemic Control (2015)  Target Ranges:  Prepandial:   less than 140 mg/dL      Peak postprandial:   less than 180 mg/dL (1-2 hours)      Critically ill patients:  140 - 180 mg/dL  Results for Patsey BertholdSOUTHERN, Aarik B (MRN [id number]) as of 12/11/2015 12:44  Ref. Range 12/07/2015 08:35 12/08/2015 01:26 12/09/2015 04:44 12/10/2015 04:32 12/11/2015 05:00  Glucose Latest Ref Range: 65-99 mg/dL 409140 (H) 811169 (H) 914174 (H) 182 (H) 221 (H)    Review of Glycemic Control  Diabetes history: DM2 Outpatient Diabetes medications: Actos 45 mg daily, Levemir 25 units daily Current orders for Inpatient glycemic control: None  Inpatient Diabetes Program Recommendations: Correction (SSI): Please consider ordering CBGs with Novolog correction scale Q4H. HgbA1C: Please consider ordering an A1C to evaluate glycemic control over the past 2-3 months.  Thanks, Orlando PennerMarie Jakia Kennebrew, RN, MSN, CDE Diabetes Coordinator Inpatient Diabetes Program [phone] (Team Pager from 8am to 5pm) [phone] ([place] office) [phone] [facility] office) [phone] [facility] office)

## 2015-12-11 NOTE — Progress Notes (Signed)
[name]  MRN: [id number]  DOB/AGE: 1934-02-04 83 y.o.  Primary Care Physician:Orr, Corey Senior, MD  Admit date: 12/21/2015  Chief Complaint:  Chief Complaint  Patient presents with  . Abdominal Pain    S-Pt presented on  11/25/2015 with  Chief Complaint  Patient presents with  . Abdominal Pain  .    Pt remains confused.Pt does not offer any complaints.   Meds . antiseptic oral rinse  7 mL Mouth Rinse q12n4p  . bisacodyl  10 mg Rectal BID  . chlorhexidine  15 mL Mouth Rinse BID  . enoxaparin (LOVENOX) injection  60 mg Subcutaneous Q24H  . pantoprazole (PROTONIX) IV  40 mg Intravenous Q12H  . piperacillin-tazobactam (ZOSYN)  IV  3.375 g Intravenous Q8H     Physical Exam: Vital signs in last 24 hours: Temp:  [97 F (36.1 C)-98.7 F (37.1 C)] 97 F (36.1 C) (07/19 0800) Pulse Rate:  [91-115] 99 (07/19 0800) Resp:  [10-27] 19 (07/19 0800) BP: (111-167)/(56-112) 150/78 mmHg (07/19 0800) SpO2:  [94 %-100 %] 98 % (07/19 0800) Weight:  [263 lb 14.3 oz (119.7 kg)] 263 lb 14.3 oz (119.7 kg) (07/19 0500) Weight change: 1 lb 1.6 oz (0.5 kg) Last BM Date: 12/09/15  Intake/Output from previous day: 07/18 0701 - 07/19 0700 In: 2422.5 [I.V.:2300; IV Piggyback:112.5] Out: 1500 [Urine:1100; Emesis/NG output:400] Total I/O In: 400 [I.V.:400] Out: -    Physical Exam: General- pt is awake,but confused Resp- No acute REsp distress, decreased BS at bases CVS- S1S2 regular in rate and rhythm GIT- BS+ but slow, distended, JP drain in situ EXT- NoLE Edema,No Cyanosis   Lab Results: CBC  Recent Labs  12/09/15 0444 12/11/15 0500  WBC 10.5 11.4*  HGB 9.3* 8.6*  HCT 29.0* 27.0*  PLT 231 225    BMET  Recent Labs  12/10/15 0432 12/11/15 0500  NA 148* 151*  K 4.9 4.6  CL 117* 116*  CO2 24 24  GLUCOSE 182* 221*  BUN 55* 66*  CREATININE 2.05* 2.52*  CALCIUM 9.3 9.1    Trend Creat  2017 4.0--4.1=>3.51=>2.0=>2.5  ( Baseline 2.7--3.1) 2015  2.9--3.2 2014 2.2 2011 1.78 2010 1.6--2.58    MICRO Recent Results (from the past 240 hour(s))  MRSA PCR Screening     Status: None   Collection Time: 12/05/15 11:55 AM  Result Value Ref Range Status   MRSA by PCR NEGATIVE NEGATIVE Final    Comment:        The GeneXpert MRSA Assay (FDA approved for NASAL specimens only), is one component of a comprehensive MRSA colonization surveillance program. It is not intended to diagnose MRSA infection nor to guide or monitor treatment for MRSA infections.       Lab Results  Component Value Date   PTH 144.3* 07/26/2013   CALCIUM 9.1 12/11/2015   PHOS 2.6 12/11/2015          Impression: 1)Renal AKI secondary to Multiple factors  Prerenal and ATN from Hypotension  Meds- pt had NSAIDS on board as outpt  AKI on CKD  CKD stage 4 .  CKD since 2010  CKD secondary to age asociated decline/HTN/ DM/ Morbid obesty/obstruction ( pt has hx of nephrolithiasis)                 AKI worsening now              2)CVS stable  Pt was hypotensive earlier  3)Anemia HGb trending down Sec to GI perforation Sx following for  the need of transfusion   4)CKD Mineral-Bone Disorder PTH high Secondary Hyperparathyroidism present Phosphorus not at goal Calcium is at goal.  5)GI- admitted with perforation now s/p gastrorrhaphy. Surgery following for need of NG tube/diet.  6)Electrolytes  Hyperkalemic  Sec to PO kcl as outpt + AKI and CKD as baseline    Now better, back with normal limits  Hypernatremic   Sec to poor po intake        7)Acid base Co2 now at goal  Plan:  Pt is being started on Liquid diet, if sodium still high Will increase IVF rate . Will follow bmet to see creat and Na trend   Addendum Pt did  eat & drink during the day Will increase IVF rate  In am if Na trends higher  [name]  S 12/11/2015, 9:43 AM

## 2015-12-11 NOTE — Care Management Important Message (Signed)
Important Message  Patient Details  Name: [name] MRN: [id number] Date of Birth: [birth year]   Medicare Important Message Given:  Yes    Adonis HugueninBerkhead, Yaacov Koziol L, RN 12/11/2015, 9:03 AM

## 2015-12-11 NOTE — Progress Notes (Signed)
7 Days Post-Op  Subjective: Patient appears more alert this morning. Denies any abdominal pain.  Objective: Vital signs in last 24 hours: Temp:  [97 F (36.1 C)-98.7 F (37.1 C)] 97 F (36.1 C) (07/19 0800) Pulse Rate:  [91-115] 99 (07/19 0800) Resp:  [10-27] 19 (07/19 0800) BP: (111-167)/(56-112) 150/78 mmHg (07/19 0800) SpO2:  [94 %-100 %] 98 % (07/19 0800) Weight:  [119.7 kg (263 lb 14.3 oz)] 119.7 kg (263 lb 14.3 oz) (07/19 0500) Last BM Date: 12/09/15  Intake/Output from previous day: 07/18 0701 - 07/19 0700 In: 2422.5 [I.V.:2300; IV Piggyback:112.5] Out: 1500 [Urine:1100; Emesis/NG output:400] Intake/Output this shift: Total I/O In: 400 [I.V.:400] Out: -   General appearance: alert, cooperative and no distress Resp: Bilateral upper respiratory crackles noted. No wheezing noted. Cardio: regular rate and rhythm, S1, S2 normal, no murmur, click, rub or gallop GI: soft, non-tender; bowel sounds normal; no masses,  no organomegaly and Incision healing well. JP drainage without bilious drainage.  Lab Results:   Recent Labs  12/09/15 0444 12/11/15 0500  WBC 10.5 11.4*  HGB 9.3* 8.6*  HCT 29.0* 27.0*  PLT 231 225   BMET  Recent Labs  12/10/15 0432 12/11/15 0500  NA 148* 151*  K 4.9 4.6  CL 117* 116*  CO2 24 24  GLUCOSE 182* 221*  BUN 55* 66*  CREATININE 2.05* 2.52*  CALCIUM 9.3 9.1   PT/INR No results for input(s): LABPROT, INR in the last 72 hours.  Studies/Results: No results found.  Anti-infectives: Anti-infectives    Start     Dose/Rate Route Frequency Ordered Stop   12/08/15 1400  piperacillin-tazobactam (ZOSYN) IVPB 3.375 g     3.375 g 12.5 mL/hr over 240 Minutes Intravenous Every 8 hours 12/08/15 0901     12/05/15 1400  piperacillin-tazobactam (ZOSYN) 2.25 g in dextrose 5 % 50 mL IVPB  Status:  Discontinued     2.25 g 100 mL/hr over 30 Minutes Intravenous Every 8 hours 12/05/15 0810 12/08/15 0900   12/05/15 1000  piperacillin-tazobactam  (ZOSYN) IVPB 2.25 g  Status:  Discontinued     2.25 g 100 mL/hr over 30 Minutes Intravenous Every 8 hours 12/05/15 0808 12/05/15 0810   07-15-15 1800  piperacillin-tazobactam (ZOSYN) IVPB 3.375 g  Status:  Discontinued     3.375 g 12.5 mL/hr over 240 Minutes Intravenous Every 8 hours 07-15-15 1707 12/05/15 0808   07-15-15 1245  Ampicillin-Sulbactam (UNASYN) 3 g in sodium chloride 0.9 % 100 mL IVPB     3 g 100 mL/hr over 60 Minutes Intravenous  Once 07-15-15 1236 07-15-15 1329      Assessment/Plan: s/p Procedure(s): EXPLORATORY LAPAROTOMY GASTRORRHAPHY Impression: Status post gastrorrhaphy. No bilious drainage noted. Will DC NG tube. Advance to full liquid diet. Will monitor in ICU today. Anticipate transfer to regular floor over next 24-48 hours. Hypernatremia being addressed by nephrology. Chronic renal failure stable.  LOS: 7 days    Ashey Tramontana A 12/11/2015

## 2015-12-12 ENCOUNTER — Inpatient Hospital Stay (HOSPITAL_COMMUNITY): Payer: Medicare Other

## 2015-12-12 LAB — GLUCOSE, CAPILLARY
GLUCOSE-CAPILLARY: 290 mg/dL — AB (ref 65–99)
GLUCOSE-CAPILLARY: 238 mg/dL — AB (ref 65–99)
GLUCOSE-CAPILLARY: 278 mg/dL — AB (ref 65–99)
Glucose-Capillary: 218 mg/dL — ABNORMAL HIGH (ref 65–99)

## 2015-12-12 LAB — CBC
HEMATOCRIT: 22 % — AB (ref 39.0–52.0)
HEMATOCRIT: 25.7 % — AB (ref 39.0–52.0)
HEMOGLOBIN: 7.1 g/dL — AB (ref 13.0–17.0)
HEMOGLOBIN: 8.5 g/dL — AB (ref 13.0–17.0)
MCH: 29.8 pg (ref 26.0–34.0)
MCH: 29.6 pg (ref 26.0–34.0)
MCHC: 32.3 g/dL (ref 30.0–36.0)
MCHC: 33.1 g/dL (ref 30.0–36.0)
MCV: 92.4 fL (ref 78.0–100.0)
MCV: 89.5 fL (ref 78.0–100.0)
Platelets: 249 10*3/uL (ref 150–400)
Platelets: 189 10*3/uL (ref 150–400)
RBC: 2.38 MIL/uL — ABNORMAL LOW (ref 4.22–5.81)
RBC: 2.87 MIL/uL — AB (ref 4.22–5.81)
RDW: 14.4 % (ref 11.5–15.5)
RDW: 14.6 % (ref 11.5–15.5)
WBC: 19.3 10*3/uL — AB (ref 4.0–10.5)
WBC: 22.1 10*3/uL — ABNORMAL HIGH (ref 4.0–10.5)

## 2015-12-12 LAB — BASIC METABOLIC PANEL
ANION GAP: 7 (ref 5–15)
BUN: 65 mg/dL — ABNORMAL HIGH (ref 6–20)
CO2: 24 mmol/L (ref 22–32)
Calcium: 8.8 mg/dL — ABNORMAL LOW (ref 8.9–10.3)
Chloride: 120 mmol/L — ABNORMAL HIGH (ref 101–111)
Creatinine, Ser: 2.72 mg/dL — ABNORMAL HIGH (ref 0.61–1.24)
GFR calc Af Amer: 24 mL/min — ABNORMAL LOW (ref >60)
GFR calc non Af Amer: 21 mL/min — ABNORMAL LOW (ref >60)
GLUCOSE: 252 mg/dL — AB (ref 65–99)
POTASSIUM: 4 mmol/L (ref 3.5–5.1)
Sodium: 151 mmol/L — ABNORMAL HIGH (ref 135–145)

## 2015-12-12 LAB — PREPARE RBC (CROSSMATCH)

## 2015-12-12 LAB — TROPONIN I: TROPONIN I: 0.17 ng/mL — AB (ref <0.03)

## 2015-12-12 MED ORDER — SODIUM CHLORIDE 0.9 % IV BOLUS (SEPSIS)
500.0000 mL | Freq: Once | INTRAVENOUS | Status: AC
  Administered 2015-12-12: 500 mL via INTRAVENOUS

## 2015-12-12 MED ORDER — INSULIN ASPART 100 UNIT/ML ~~LOC~~ SOLN
0.0000 [IU] | Freq: Three times a day (TID) | SUBCUTANEOUS | Status: DC
  Administered 2015-12-12: 3 [IU] via SUBCUTANEOUS
  Administered 2015-12-12: 5 [IU] via SUBCUTANEOUS

## 2015-12-12 MED ORDER — SODIUM CHLORIDE 0.9 % IV SOLN
Freq: Once | INTRAVENOUS | Status: AC
  Administered 2015-12-12: 08:00:00 via INTRAVENOUS

## 2015-12-12 MED ORDER — SODIUM CHLORIDE 0.9 % IV SOLN: Freq: Once | INTRAVENOUS | Status: DC

## 2015-12-12 NOTE — Progress Notes (Signed)
Dr Lovell SheehanJenkins notified of Pt large bloody BM's x 2 so far. Pt RBC infusion in progress.

## 2015-12-12 NOTE — Progress Notes (Signed)
Inpatient Diabetes Program Recommendations  AACE/ADA: New Consensus Statement on Inpatient Glycemic Control (2015)  Target Ranges:  Prepandial:   less than 140 mg/dL      Peak postprandial:   less than 180 mg/dL (1-2 hours)      Critically ill patients:  140 - 180 mg/dL   Results for Corey BertholdSOUTHERN, [name] (MRN [id number]) as of 12/12/2015 09:10  Ref. Range 12/11/2015 16:21 12/11/2015 21:30 12/12/2015 07:36  Glucose-Capillary Latest Ref Range: 65-99 mg/dL 469242 (H) 629225 (H) 528290 (H)   Review of Glycemic Control  Diabetes history: DM2 Outpatient Diabetes medications: Actos 45 mg daily, Levemir 25 units daily Current orders for Inpatient glycemic control: Novolog 0-15 units TID with meals  Inpatient Diabetes Program Recommendations: Insulin - Basal: Please consider ordering Levemir 10 units daily (starting now). Correction (SSI): Please consider ordering Novolog bedtime correction scale. HgbA1C: A1C in process.  Thanks, Orlando PennerMarie Shanice Poznanski, RN, MSN, CDE Diabetes Coordinator Inpatient Diabetes Program [phone] (Team Pager from 8am to 5pm) [phone] ([facility] office) [phone] [facility] office) [phone] [facility] office)\

## 2015-12-12 NOTE — Progress Notes (Addendum)
Subjective: Interval History: Patient is lethargic but arousable. He should his head otherwise he doesn't communicate.  Objective: Vital signs in last 24 hours: Temp:  [97 F (36.1 C)-99.8 F (37.7 C)] 99.1 F (37.3 C) (07/20 0400) Pulse Rate:  [87-156] 117 (07/20 0700) Resp:  [16-38] 23 (07/20 0700) BP: (67-150)/(35-96) 79/60 mmHg (07/20 0700) SpO2:  [93 %-100 %] 100 % (07/20 0700) Weight:  [118.7 kg (261 lb 11 oz)] 118.7 kg (261 lb 11 oz) (07/20 0500) Weight change: -1 kg (-2 lb 3.3 oz)  Intake/Output from previous day: 07/19 0701 - 07/20 0700 In: 2155.8 [P.O.:270; I.V.:1635.8; NG/GT:100; IV Piggyback:150] Out: 1161 [Urine:1150; Drains:10; Stool:1] Intake/Output this shift:    Generally as stated above patient is lethargic but doesn't seem to be in any apparent distress. Chest is clear to auscultation Heart exam revealed regular rate and rhythm Abdomen hypoactive Extremities no edema  Lab Results:  Recent Labs  12/11/15 0500 12/12/15 0659  WBC 11.4* 19.3*  HGB 8.6* 7.1*  HCT 27.0* 22.0*  PLT 225 249   BMET:   Recent Labs  12/11/15 0500 12/12/15 0355  NA 151* 151*  K 4.6 4.0  CL 116* 120*  CO2 24 24  GLUCOSE 221* 252*  BUN 66* 65*  CREATININE 2.52* 2.72*  CALCIUM 9.1 8.8*   No results for input(s): PTH in the last 72 hours. Iron Studies: No results for input(s): IRON, TIBC, TRANSFERRIN, FERRITIN in the last 72 hours.  Studies/Results: Dg Chest Port 1 View  12/12/2015  CLINICAL DATA:  Tachypnea EXAM: PORTABLE CHEST 1 VIEW COMPARISON:  07/28/2015 FINDINGS: Low volume chest with minimal scar or atelectasis at the right base. There is no edema, consolidation, effusion, or pneumothorax. Chronic cardiopericardial enlargement and aortic tortuosity. Status post CABG. IMPRESSION: 1. Low volume chest without acute finding. 2. Chronic cardiomegaly. Electronically Signed   By: Marnee SpringJonathon  Watts M.D.   On: 12/12/2015 05:19    I have reviewed the patient's current  medications.  Assessment/Plan: Problem #1 acute kidney injury: Possibly prerenal versus ATN. His renal function Start increasing possibly from another insult of ATN. Patient presently hypotensive. He remains non-oliguric. Problem #2 hyperkalemia: Potassium is normal. Problem #3 hypernatremia: Most likely from lack of freewater. His sodium is 151 and has increased. Most likely from normal saline. Problem #4 history of anemia: His hemoglobin declined further possibly from GI bleeding. Problem #5 history of diabetes: His blood sugar is high this morning Problem #6 history of perforated duodenal ulcer status post surgery Problem #7 hypotension. Presently his blood pressure is 79/60 and heart rate of 117. Etiology for hypotension not clear. Patient is afebrile but his white blood cell count is increasing. Plan: We'll give him normal saline 500 mL bolus. After that we'll change it into half normal saline at 125 mL per hour 2] we'll check his renal panel in the morning  3] patient possibly require blood transfusion.   LOS: 8 days   Tymesha Ditmore S 12/12/2015,7:46 AM

## 2015-12-12 NOTE — Progress Notes (Signed)
Pharmacy Antibiotic Note  [name] is a 82 y.o. male admitted on 12/01/2015 with intr-abdominal infection.  Pharmacy has been consulted for zosyn  dosing. CT scan the abdomen revealed perforated viscus, consistent with perforated duodenal ulcer. Pt had exploratory laparotomy.   SCr slightly worse today, had been improving.  Normalized clcr ~ 2925ml/min. WBC elevated. Afebrile. Day#9 of zosyn  Plan: Zosyn to 3.375gm IV q8h EID F/U plan and cultures Monitor labs and renal function and length of treatment  Height: 6\' 2"  (188 cm) Weight: 261 lb 11 oz (118.7 kg) IBW/kg (Calculated) : 82.2  Temp (24hrs), Avg:99 F (37.2 C), Min:97.4 F (36.3 C), Max:100.1 F (37.8 C)   Recent Labs Lab 12/07/15 0904 12/08/15 0126 12/09/15 0444 12/10/15 0432 12/11/15 0500 12/12/15 0355 12/12/15 0659  WBC 12.7* 9.6 10.5  --  11.4*  --  19.3*  CREATININE  --  2.92* 2.26* 2.05* 2.52* 2.72*  --     Estimated Creatinine Clearance: 29.7 mL/min (by C-G formula based on Cr of 2.72).    No Known Allergies  Antimicrobials this admission: Zosyn 7/12 >>  Unasyn x 1 dose 7/12  Recent Results (from the past 240 hour(s))  MRSA PCR Screening     Status: None   Collection Time: 12/05/15 11:55 AM  Result Value Ref Range Status   MRSA by PCR NEGATIVE NEGATIVE Final    Comment:        The GeneXpert MRSA Assay (FDA approved for NASAL specimens only), is one component of a comprehensive MRSA colonization surveillance program. It is not intended to diagnose MRSA infection nor to guide or monitor treatment for MRSA infections.    Thank you for allowing pharmacy to be a part of this patient's care.  Elder CyphersLorie Avaneesh Pepitone, BS Pharm D, [place] Clinical Pharmacist Pager [phone] 12/12/2015 11:23 AM

## 2015-12-12 NOTE — Progress Notes (Signed)
8 Days Post-Op  Subjective: Patient developed tachycardia and relative hypotension. Soon after, he had a bloody bowel movement. Patient has been alert and arousable. Patient denies any pain.  Objective: Vital signs in last 24 hours: Temp:  [97.4 F (36.3 C)-99.8 F (37.7 C)] 99.1 F (37.3 C) (07/20 0400) Pulse Rate:  [87-156] 117 (07/20 0700) Resp:  [16-38] 23 (07/20 0700) BP: (67-149)/(35-96) 79/60 mmHg (07/20 0700) SpO2:  [93 %-100 %] 100 % (07/20 0700) Weight:  [118.7 kg (261 lb 11 oz)] 118.7 kg (261 lb 11 oz) (07/20 0500) Last BM Date: 12/11/15  Intake/Output from previous day: 07/19 0701 - 07/20 0700 In: 2155.8 [P.O.:270; I.V.:1635.8; NG/GT:100; IV Piggyback:150] Out: 1161 [Urine:1150; Drains:10; Stool:1] Intake/Output this shift:    General appearance: cooperative and no distress Resp: clear to auscultation bilaterally Cardio: Sinus tachycardia. No murmurs appreciated. GI: Soft, nondistended, nontender. Dressing dry and intact. JP drainage serosanguineous and minimal. No bile was noted.  Lab Results:   Recent Labs  12/11/15 0500 12/12/15 0659  WBC 11.4* 19.3*  HGB 8.6* 7.1*  HCT 27.0* 22.0*  PLT 225 249   BMET  Recent Labs  12/11/15 0500 12/12/15 0355  NA 151* 151*  K 4.6 4.0  CL 116* 120*  CO2 24 24  GLUCOSE 221* 252*  BUN 66* 65*  CREATININE 2.52* 2.72*  CALCIUM 9.1 8.8*   PT/INR No results for input(s): LABPROT, INR in the last 72 hours.  Studies/Results: Dg Chest Port 1 View  12/12/2015  CLINICAL DATA:  Tachypnea EXAM: PORTABLE CHEST 1 VIEW COMPARISON:  07/28/2015 FINDINGS: Low volume chest with minimal scar or atelectasis at the right base. There is no edema, consolidation, effusion, or pneumothorax. Chronic cardiopericardial enlargement and aortic tortuosity. Status post CABG. IMPRESSION: 1. Low volume chest without acute finding. 2. Chronic cardiomegaly. Electronically Signed   By: Marnee SpringJonathon  Watts M.D.   On: 12/12/2015 05:19     Anti-infectives: Anti-infectives    Start     Dose/Rate Route Frequency Ordered Stop   12/08/15 1400  piperacillin-tazobactam (ZOSYN) IVPB 3.375 g     3.375 g 12.5 mL/hr over 240 Minutes Intravenous Every 8 hours 12/08/15 0901     12/05/15 1400  piperacillin-tazobactam (ZOSYN) 2.25 g in dextrose 5 % 50 mL IVPB  Status:  Discontinued     2.25 g 100 mL/hr over 30 Minutes Intravenous Every 8 hours 12/05/15 0810 12/08/15 0900   12/05/15 1000  piperacillin-tazobactam (ZOSYN) IVPB 2.25 g  Status:  Discontinued     2.25 g 100 mL/hr over 30 Minutes Intravenous Every 8 hours 12/05/15 0808 12/05/15 0810   12/17/2015 1800  piperacillin-tazobactam (ZOSYN) IVPB 3.375 g  Status:  Discontinued     3.375 g 12.5 mL/hr over 240 Minutes Intravenous Every 8 hours 12/13/2015 1707 12/05/15 0808   11/23/2015 1245  Ampicillin-Sulbactam (UNASYN) 3 g in sodium chloride 0.9 % 100 mL IVPB     3 g 100 mL/hr over 60 Minutes Intravenous  Once 11/25/2015 1236 11/27/2015 1329      Assessment/Plan: s/p Procedure(s): EXPLORATORY LAPAROTOMY GASTRORRHAPHY Impression: Patient has had a drop in his hemoglobin, most likely secondary to peptic ulcer disease. Patient has been on Protonix. Will stop Lovenox. Will give 2 units of packed red blood cells due to his tachycardia and hypotension. Patient still with hypernatremia which is being addressed by nephrology. Renal insufficiency stable. Hyperglycemia being addressed.  LOS: 8 days    Marylouise Mallet A 12/12/2015

## 2015-12-12 NOTE — Progress Notes (Signed)
Dr Lovell Sheehanjenkins updated on Pt condition with more bloody BM's CBC Pending

## 2015-12-12 NOTE — Progress Notes (Signed)
Patient tachypneic to the 40's and tachycardic on assessment with HR up to the 150's- ST. SBP dropped to 60's and patient became less responsive. Patient with copious respiratory secretions and little to no cough effort. NTS attempted and moderate amount of secretions obtained. Dr. Lovell SheehanJenkins notified. Orders given for 500 cc bolus and STAT CXR. Patient's HR currently 120, RR 25 and most recent BP 91/57, MAP 68. Will continue to monitor.

## 2015-12-13 LAB — COMPREHENSIVE METABOLIC PANEL
ALK PHOS: 67 U/L (ref 38–126)
ALT: 25 U/L (ref 17–63)
AST: 59 U/L — AB (ref 15–41)
Albumin: 2 g/dL — ABNORMAL LOW (ref 3.5–5.0)
Anion gap: 10 (ref 5–15)
BUN: 83 mg/dL — AB (ref 6–20)
CALCIUM: 8.3 mg/dL — AB (ref 8.9–10.3)
CHLORIDE: 121 mmol/L — AB (ref 101–111)
CO2: 19 mmol/L — AB (ref 22–32)
CREATININE: 3.9 mg/dL — AB (ref 0.61–1.24)
GFR, EST AFRICAN AMERICAN: 15 mL/min — AB (ref >60)
GFR, EST NON AFRICAN AMERICAN: 13 mL/min — AB (ref >60)
Glucose, Bld: 281 mg/dL — ABNORMAL HIGH (ref 65–99)
Potassium: 4 mmol/L (ref 3.5–5.1)
SODIUM: 150 mmol/L — AB (ref 135–145)
Total Bilirubin: 0.6 mg/dL (ref 0.3–1.2)
Total Protein: 5.8 g/dL — ABNORMAL LOW (ref 6.5–8.1)

## 2015-12-13 LAB — GLUCOSE, CAPILLARY
GLUCOSE-CAPILLARY: 256 mg/dL — AB (ref 65–99)
Glucose-Capillary: 232 mg/dL — ABNORMAL HIGH (ref 65–99)
Glucose-Capillary: 244 mg/dL — ABNORMAL HIGH (ref 65–99)
Glucose-Capillary: 288 mg/dL — ABNORMAL HIGH (ref 65–99)
Glucose-Capillary: 289 mg/dL — ABNORMAL HIGH (ref 65–99)

## 2015-12-13 LAB — CBC
HCT: 21.7 % — ABNORMAL LOW (ref 39.0–52.0)
Hemoglobin: 7.2 g/dL — ABNORMAL LOW (ref 13.0–17.0)
MCH: 29.5 pg (ref 26.0–34.0)
MCHC: 33.2 g/dL (ref 30.0–36.0)
MCV: 88.9 fL (ref 78.0–100.0)
PLATELETS: 170 10*3/uL (ref 150–400)
RBC: 2.44 MIL/uL — ABNORMAL LOW (ref 4.22–5.81)
RDW: 14.9 % (ref 11.5–15.5)
WBC: 19.1 10*3/uL — ABNORMAL HIGH (ref 4.0–10.5)

## 2015-12-13 LAB — HEMOGLOBIN A1C
HEMOGLOBIN A1C: 8.1 % — AB (ref 4.8–5.6)
HEMOGLOBIN A1C: 8 % — AB (ref 4.8–5.6)
Mean Plasma Glucose: 186 mg/dL
Mean Plasma Glucose: 183 mg/dL

## 2015-12-13 LAB — PREPARE RBC (CROSSMATCH)

## 2015-12-13 LAB — PHOSPHORUS: Phosphorus: 3.2 mg/dL (ref 2.5–4.6)

## 2015-12-13 LAB — MAGNESIUM: MAGNESIUM: 1.9 mg/dL (ref 1.7–2.4)

## 2015-12-13 MED ORDER — FUROSEMIDE 10 MG/ML IJ SOLN
60.0000 mg | Freq: Two times a day (BID) | INTRAMUSCULAR | Status: DC
  Administered 2015-12-13 – 2015-12-16 (×7): 60 mg via INTRAVENOUS
  Filled 2015-12-13 (×7): qty 6

## 2015-12-13 MED ORDER — INSULIN DETEMIR 100 UNIT/ML ~~LOC~~ SOLN
10.0000 [IU] | Freq: Every day | SUBCUTANEOUS | Status: DC
  Administered 2015-12-13: 10 [IU] via SUBCUTANEOUS
  Filled 2015-12-13 (×4): qty 0.1

## 2015-12-13 MED ORDER — SODIUM CHLORIDE 0.9 % IV SOLN
Freq: Once | INTRAVENOUS | Status: AC
  Administered 2015-12-13: 09:00:00 via INTRAVENOUS

## 2015-12-13 MED ORDER — SUCRALFATE 1 GM/10ML PO SUSP
1.0000 g | Freq: Three times a day (TID) | ORAL | Status: DC
  Administered 2015-12-13 – 2015-12-16 (×11): 1 g via ORAL
  Filled 2015-12-13 (×13): qty 10

## 2015-12-13 MED ORDER — TRACE MINERALS CR-CU-MN-SE-ZN 10-1000-500-60 MCG/ML IV SOLN
INTRAVENOUS | Status: AC
  Administered 2015-12-13: 18:00:00 via INTRAVENOUS
  Filled 2015-12-13: qty 840

## 2015-12-13 MED ORDER — PRO-STAT SUGAR FREE PO LIQD
30.0000 mL | Freq: Two times a day (BID) | ORAL | Status: DC
  Administered 2015-12-13 – 2015-12-14 (×2): 30 mL via ORAL
  Administered 2015-12-15: 23:00:00 via ORAL
  Filled 2015-12-13 (×5): qty 30

## 2015-12-13 MED ORDER — ALUM & MAG HYDROXIDE-SIMETH 200-200-20 MG/5ML PO SUSP: 30.0000 mL | Freq: Three times a day (TID) | ORAL | Status: DC

## 2015-12-13 MED ORDER — INSULIN ASPART 100 UNIT/ML ~~LOC~~ SOLN
0.0000 [IU] | SUBCUTANEOUS | Status: DC
  Administered 2015-12-13: 7 [IU] via SUBCUTANEOUS
  Administered 2015-12-13 (×2): 11 [IU] via SUBCUTANEOUS
  Administered 2015-12-14: 20 [IU] via SUBCUTANEOUS
  Administered 2015-12-14 (×4): 7 [IU] via SUBCUTANEOUS
  Administered 2015-12-14: 15 [IU] via SUBCUTANEOUS

## 2015-12-13 MED ORDER — INSULIN ASPART 100 UNIT/ML ~~LOC~~ SOLN
0.0000 [IU] | Freq: Three times a day (TID) | SUBCUTANEOUS | Status: DC
  Administered 2015-12-13: 11 [IU] via SUBCUTANEOUS

## 2015-12-13 MED ORDER — SODIUM CHLORIDE 0.45 % IV SOLN
INTRAVENOUS | Status: DC
  Administered 2015-12-13 – 2015-12-14 (×2): via INTRAVENOUS

## 2015-12-13 NOTE — Progress Notes (Signed)
Patient able to consume sips of apple juice with very little coughing. Attempt to give patient pro-stat mixed in juiced coughing noted. Patient able to consumed small amount of ice chips.

## 2015-12-13 NOTE — Progress Notes (Signed)
PARENTERAL NUTRITION CONSULT NOTE - INITIAL  Pharmacy Consult for TPN Indication: intolerance to enteral feeding  No Known Allergies  Patient Measurements: Height: 6\' 2"  (188 cm) Weight: 268 lb 8.3 oz (121.8 kg) IBW/kg (Calculated) : 82.2   Vital Signs: Temp: 99.6 F (37.6 C) (07/21 0944) Temp Source: Axillary (07/21 0944) BP: 158/81 mmHg (07/21 1100) Pulse Rate: 90 (07/21 1100) Intake/Output from previous day: 07/20 0701 - 07/21 0700 In: 3221.7 [P.O.:240; I.V.:2281.7; Blood:650; IV Piggyback:50] Out: 800 [Urine:800] Intake/Output from this shift: Total I/O In: 625 [I.V.:625] Out: -   Labs:  Recent Labs  12/12/15 0659 12/12/15 1656 12/13/15 0003  WBC 19.3* 22.1* 19.1*  HGB 7.1* 8.5* 7.2*  HCT 22.0* 25.7* 21.7*  PLT 249 189 170     Recent Labs  12/11/15 0500 12/12/15 0355 12/13/15 0003 12/13/15 0411  NA 151* 151*  --  150*  K 4.6 4.0  --  4.0  CL 116* 120*  --  121*  CO2 24 24  --  19*  GLUCOSE 221* 252*  --  281*  BUN 66* 65*  --  83*  CREATININE 2.52* 2.72*  --  3.90*  CALCIUM 9.1 8.8*  --  8.3*  MG  --   --  1.9  --   PHOS 2.6  --   --  3.2  PROT  --   --   --  5.8*  ALBUMIN 2.4*  --   --  2.0*  AST  --   --   --  59*  ALT  --   --   --  25  ALKPHOS  --   --   --  67  BILITOT  --   --   --  0.6   Estimated Creatinine Clearance: 20.9 mL/min (by C-G formula based on Cr of 3.9).    Recent Labs  12/12/15 2207 12/13/15 0815 12/13/15 1121  GLUCAP 278* 289* 288*    Medical History: Past Medical History  Diagnosis Date  . Hypertension   . Hyperlipidemia   . Diabetes mellitus, type II, insulin dependent (HCC)   . Coronary atherosclerosis of native coronary artery 2003    Multivessel s/p CABG  . Morbid obesity (HCC)   . GERD (gastroesophageal reflux disease)   . History of tobacco abuse   . Back pain   . Hard of hearing   . Aortic root dilatation (HCC)   . CKD (chronic kidney disease) stage 4, GFR 15-29 ml/min (HCC)     Medications:   Prescriptions prior to admission  Medication Sig Dispense Refill Last Dose  . aspirin EC 81 MG tablet Take 81 mg by mouth daily.   12/03/2015 at Unknown time  . atorvastatin (LIPITOR) 20 MG tablet TAKE 1 TABLET BY MOUTH AT BEDTIME FOR CHOLESTEROL. 30 tablet 0 12/03/2015 at Unknown time  . furosemide (LASIX) 20 MG tablet Take 2 tablets (40 mg total) by mouth daily. Restart on 3/14 30 tablet  12/03/2015 at Unknown time  . gabapentin (NEURONTIN) 100 MG capsule Take 100 mg by mouth 3 (three) times daily.   12/03/2015 at Unknown time  . insulin detemir (LEVEMIR) 100 UNIT/ML injection Inject 0.25 mLs (25 Units total) into the skin daily.   12/03/2015 at Unknown time  . naproxen (NAPROSYN) 500 MG tablet Take 500 mg by mouth 2 (two) times daily with a meal.    Past Week at Unknown time  . pantoprazole (PROTONIX) 40 MG tablet Take 1 tablet (40 mg total) by mouth 2 (  two) times daily before a meal. 180 tablet 3 12/03/2015 at Unknown time  . pioglitazone (ACTOS) 45 MG tablet Take 45 mg by mouth daily.    12/03/2015 at Unknown time  . potassium chloride (MICRO-K) 10 MEQ CR capsule Take 1 capsule (10 mEq total) by mouth daily. Restart on 3/14   12/03/2015 at Unknown time  . SPIRIVA RESPIMAT 2.5 MCG/ACT AERS Inhale 1 puff into the lungs daily.    12/03/2015 at Unknown time  . [DISCONTINUED] HYDROcodone-acetaminophen (NORCO) 7.5-325 MG tablet Take 1 tablet by mouth every 4 (four) hours as needed for moderate pain (Must last 30 days.  Do not drive or operate machinery while taking this medicine.). 120 tablet 0 12/03/2015 at Unknown time  . albuterol (PROVENTIL HFA;VENTOLIN HFA) 108 (90 BASE) MCG/ACT inhaler Inhale 2 puffs into the lungs every 4 (four) hours as needed for wheezing or shortness of breath.   unknown  . albuterol (PROVENTIL) (2.5 MG/3ML) 0.083% nebulizer solution Take 3 mLs (2.5 mg total) by nebulization every 6 (six) hours as needed for wheezing or shortness of breath. 75 mL 12 unknown  . citalopram (CELEXA) 40  MG tablet Take 0.5 tablets (20 mg total) by mouth daily. (Patient not taking: Reported on 12/07/2015) 30 tablet 1 Taking  . metoprolol tartrate (LOPRESSOR) 25 MG tablet Take 0.5 tablets (12.5 mg total) by mouth 2 (two) times daily. (Patient not taking: Reported on 11/29/2015) 30 tablet 1 Taking    Insulin Requirements in the past 24 hours:  27 units ssi  Current Nutrition:  Full liquid diet  Assessment: 82 yo man s/p repair of duodenal perforation with active GI bleed and symptomatic anemia to start TPN.  He has hypernatremia, elevated BUN, elevated SrCr and elevated glucoses.   Nutritional Goals:  1300-1500 kCal,  147-172 grams of protein per day per RD  Plan:  Will add levemir 10 units sq daily and change ssi to q4 hours Will also add regular insulin 10 units to TPN Start prostat 30 ml bid Start Clinimix E 5/15 at 35 ml/hr with trace elements and MVI Will hold off on starting lipids today F/u am labs and cbgs  Delmas Faucett Poteet 12/13/2015,11:45 AM

## 2015-12-13 NOTE — Progress Notes (Signed)
9 Days Post-Op  Subjective: Patient more alert this morning. Denies any abdominal pain.  Objective: Vital signs in last 24 hours: Temp:  [96.8 F (36 C)-100.1 F (37.8 C)] 99.2 F (37.3 C) (07/21 0400) Pulse Rate:  [48-132] 109 (07/21 0700) Resp:  [16-36] 20 (07/21 0600) BP: (69-155)/(37-104) 129/69 mmHg (07/21 0700) SpO2:  [95 %-100 %] 98 % (07/21 0700) Weight:  [121.8 kg (268 lb 8.3 oz)] 121.8 kg (268 lb 8.3 oz) (07/21 0500) Last BM Date: 12/11/15  Intake/Output from previous day: 07/20 0701 - 07/21 0700 In: 3221.7 [P.O.:240; I.V.:2281.7; Blood:650; IV Piggyback:50] Out: 800 [Urine:800] Intake/Output this shift:    General appearance: alert and no distress Resp: clear to auscultation bilaterally Cardio: regular rate and rhythm, S1, S2 normal, no murmur, click, rub or gallop GI: Soft. Dressing dry and intact. JP drainage serosanguineous, low. No rigidity noted. Less distended.  Lab Results:   Recent Labs  12/12/15 1656 12/13/15 0003  WBC 22.1* 19.1*  HGB 8.5* 7.2*  HCT 25.7* 21.7*  PLT 189 170   BMET  Recent Labs  12/12/15 0355 12/13/15 0411  NA 151* 150*  K 4.0 4.0  CL 120* 121*  CO2 24 19*  GLUCOSE 252* 281*  BUN 65* 83*  CREATININE 2.72* 3.90*  CALCIUM 8.8* 8.3*   PT/INR No results for input(s): LABPROT, INR in the last 72 hours.  Studies/Results: Dg Chest Port 1 View  12/12/2015  CLINICAL DATA:  Tachypnea EXAM: PORTABLE CHEST 1 VIEW COMPARISON:  07/28/2015 FINDINGS: Low volume chest with minimal scar or atelectasis at the right base. There is no edema, consolidation, effusion, or pneumothorax. Chronic cardiopericardial enlargement and aortic tortuosity. Status post CABG. IMPRESSION: 1. Low volume chest without acute finding. 2. Chronic cardiomegaly. Electronically Signed   By: Marnee SpringJonathon  Watts M.D.   On: 12/12/2015 05:19    Anti-infectives: Anti-infectives    Start     Dose/Rate Route Frequency Ordered Stop   12/08/15 1400  piperacillin-tazobactam  (ZOSYN) IVPB 3.375 g     3.375 g 12.5 mL/hr over 240 Minutes Intravenous Every 8 hours 12/08/15 0901     12/05/15 1400  piperacillin-tazobactam (ZOSYN) 2.25 g in dextrose 5 % 50 mL IVPB  Status:  Discontinued     2.25 g 100 mL/hr over 30 Minutes Intravenous Every 8 hours 12/05/15 0810 12/08/15 0900   12/05/15 1000  piperacillin-tazobactam (ZOSYN) IVPB 2.25 g  Status:  Discontinued     2.25 g 100 mL/hr over 30 Minutes Intravenous Every 8 hours 12/05/15 0808 12/05/15 0810   12/02/2015 1800  piperacillin-tazobactam (ZOSYN) IVPB 3.375 g  Status:  Discontinued     3.375 g 12.5 mL/hr over 240 Minutes Intravenous Every 8 hours 12/22/2015 1707 12/05/15 0808   12/12/2015 1245  Ampicillin-Sulbactam (UNASYN) 3 g in sodium chloride 0.9 % 100 mL IVPB     3 g 100 mL/hr over 60 Minutes Intravenous  Once 11/26/2015 1236 12/14/2015 1329      Assessment/Plan: s/p Procedure(s): EXPLORATORY LAPAROTOMY GASTRORRHAPHY Impression: Active GI bleed with symptomatic anemia. Renal insufficiency worsening, though back to preoperative state. Unfortunately, cannot perform EGD due to [facility]. Lovenox has been stopped and hopefully this bleeding will stop. Will start TPN. Carafate to help heal the ulcer. Leukocytosis slowly resolving. Patient is hemodynamically stable. Family has been updated.  LOS: 9 days    Shirla Hodgkiss A 12/13/2015

## 2015-12-13 NOTE — Care Management Important Message (Signed)
Important Message  Patient Details  Name: [name] MRN: [id number] Date of Birth: 02-09-1934   Medicare Important Message Given:  Yes    Adonis HugueninBerkhead, Etheleen Valtierra L, RN 12/13/2015, 12:22 PM

## 2015-12-13 NOTE — Progress Notes (Signed)
Inpatient Diabetes Program Recommendations  AACE/ADA: New Consensus Statement on Inpatient Glycemic Control (2015)  Target Ranges:  Prepandial:   less than 140 mg/dL      Peak postprandial:   less than 180 mg/dL (1-2 hours)      Critically ill patients:  140 - 180 mg/dL  Results for Patsey BertholdSOUTHERN, Cadel B (MRN [id number]) as of 12/13/2015 09:27  Ref. Range 12/12/2015 07:36 12/12/2015 12:00 12/12/2015 17:16 12/12/2015 22:07 12/13/2015 08:15  Glucose-Capillary Latest Ref Range: 65-99 mg/dL 952290 (H) 841238 (H) 324218 (H) 278 (H) 289 (H)    Review of Glycemic Control Diabetes history: DM2 Outpatient Diabetes medications: Actos 45 mg daily, Levemir 25 units daily Current orders for Inpatient glycemic control: Novolog 0-20 units TID with meals  Inpatient Diabetes Program Recommendations: Insulin - Basal: Glucose has ranged from 218-290 mg/dl over the past 24 hours. Please consider ordering Levemir 10 units Q24H starting now. Correction (SSI): Patient has poor PO intake documented. Please consider changing frequency of CBGs and Novolog correction to Q4H. HgbA1C: A1C 8.0% on 12/12/15.  Thanks, Orlando PennerMarie Josefita Weissmann, RN, MSN, CDE Diabetes Coordinator Inpatient Diabetes Program [phone] (Team Pager from 8am to 5pm) [phone] ([facility] office) [phone] [facility] office) [phone] [facility] office)

## 2015-12-13 NOTE — Care Management Note (Signed)
Case Management Note  Patient Details  Name: [name] MRN: [id number] Date of Birth: 09-30-1933  Subjective/Objective:         Patient from home with spouse prior to admission, Doing poorly here. Unable to communicate today. I have spoken to the spouse and son previously this week. Anticipate discharge to SNF when condition improves.       Action/Plan: Anticipated discharge is to SNF when medically ready for discharge. CSW following. Continue to follow.   Expected Discharge Date:  12/11/15               Expected Discharge Plan:  Skilled Nursing Facility  In-House Referral:     Discharge planning Services  CM Consult  Post Acute Care Choice:    Choice offered to:     DME Arranged:    DME Agency:     HH Arranged:    HH Agency:     Status of Service:  In process, will continue to follow  If discussed at Long Length of Stay Meetings, dates discussed:    Additional Comments:  Adonis HugueninBerkhead, Jalonda Antigua L, RN 12/13/2015, 12:19 PM

## 2015-12-13 NOTE — Progress Notes (Signed)
Subjective: Interval History: . Presently he complains of feeling thirsty and asking for water. He denies any difficulty breathing.  Objective: Vital signs in last 24 hours: Temp:  [96.8 F (36 C)-100.1 F (37.8 C)] 99.2 F (37.3 C) (07/21 0400) Pulse Rate:  [48-132] 109 (07/21 0700) Resp:  [16-31] 20 (07/21 0600) BP: (69-155)/(37-104) 129/69 mmHg (07/21 0700) SpO2:  [95 %-100 %] 98 % (07/21 0700) Weight:  [121.8 kg (268 lb 8.3 oz)] 121.8 kg (268 lb 8.3 oz) (07/21 0500) Weight change: 3.1 kg (6 lb 13.3 oz)  Intake/Output from previous day: 07/20 0701 - 07/21 0700 In: 3221.7 [P.O.:240; I.V.:2281.7; Blood:650; IV Piggyback:50] Out: 800 [Urine:800] Intake/Output this shift:    Generally patient is more alert today. He doesn't seem to be in any apparent distress. Chest is clear to auscultation Heart exam revealed regular rate and rhythm Abdomen hypoactive Extremities no edema  Lab Results:  Recent Labs  12/12/15 1656 12/13/15 0003  WBC 22.1* 19.1*  HGB 8.5* 7.2*  HCT 25.7* 21.7*  PLT 189 170   BMET:   Recent Labs  12/12/15 0355 12/13/15 0411  NA 151* 150*  K 4.0 4.0  CL 120* 121*  CO2 24 19*  GLUCOSE 252* 281*  BUN 65* 83*  CREATININE 2.72* 3.90*  CALCIUM 8.8* 8.3*   No results for input(s): PTH in the last 72 hours. Iron Studies: No results for input(s): IRON, TIBC, TRANSFERRIN, FERRITIN in the last 72 hours.  Studies/Results: Dg Chest Port 1 View  12/12/2015  CLINICAL DATA:  Tachypnea EXAM: PORTABLE CHEST 1 VIEW COMPARISON:  07/28/2015 FINDINGS: Low volume chest with minimal scar or atelectasis at the right base. There is no edema, consolidation, effusion, or pneumothorax. Chronic cardiopericardial enlargement and aortic tortuosity. Status post CABG. IMPRESSION: 1. Low volume chest without acute finding. 2. Chronic cardiomegaly. Electronically Signed   By: Marnee SpringJonathon  Watts M.D.   On: 12/12/2015 05:19    I have reviewed the patient's current  medications.  Assessment/Plan: Problem #1 acute kidney injury: Possibly prerenal versus ATN.  Proble his renal function continue to decline. Patient has 800 mL of urine out put Problem #2 hyperkalemia: Potassium is normal. Problem #3 hypernatremia: Most likely from lack of freewater. His sodium is 150 improving. Presently he is on half normal saline.  Problem #4 history of anemia: His hemoglobin declined further possibly from GI bleeding. Problem #5 history of diabetes: His blood sugar is high this morning Problem #6 history of perforated duodenal ulcer status post surgery Problem #7 hypotension. his blood pressure has improved presently is in normal range.  Plan: 1] We'll continue with hydration.  2] we'll check his renal panel in the morning  3] patient  require blood transfusion. 4] we'll start patient on Lasix 60 mg IV twice a day.   LOS: 9 days   Nashae Maudlin S 12/13/2015,7:58 AM

## 2015-12-13 NOTE — Progress Notes (Signed)
Initial Nutrition Assessment  DOCUMENTATION CODES:  Obesity unspecified, Severe malnutrition in context of acute illness/injury   Pt meets criteria for SEVERE MALNUTRITION in the context of ACUTE ILLNESS as evidenced by loss of >2% bw in 1 week and an estimated energy intake that met < or equal to 50% of needs for > or equal to 5 days  INTERVENTION:  TPN per pharmacy- see kcal/Pro needs below -Rec GIR <4 g/kg/min and keep total carb admin <150 g x 1st 24 hours -Rec IV MVI -Rec lipid infusion rate <1g/kg/day  Pt has CL diet order. If pharmacy unable to meet extremely high pro requirements w/ TPN, can add 30 mL Prostat BID, each supplement provides 100 kcal and 15 grams of protein.   NUTRITION DIAGNOSIS:  Altered GI function related to Duodenal Perforation as evidenced by GIB and indication for TPN.  GOAL:  Provide needs based on ASPEN/SCCM guidelines: Permissive underfeeding in obese:  MONITOR:  Diet advancement, Labs, Supplement acceptance, PO intake, I & O's  REASON FOR ASSESSMENT:  Consult New TPN/TNA  ASSESSMENT:  82 y/o male PMHx CKD 4, HOH, CAD, DM2, GERD, HTN, hld. Presents with 48 hr history of worsening abdominal and poor appetite. CT eval shows perforate viscus s/p ex lap and gastrorrhaphy.    Brief summary of hospitalization. Pt is POD 9. His post op course has been complicated by his many comorbidities, mainly AKI on CKD.  In last 2 days, Pt had an NG tube, was pulled at that time with advancement to full liquid diet. Developed tachycardia and hypotension, multiple BMs and renal function again worsened.  RD consulted for initiation of TPN in setting of GIB  Patient is acknowledged RD presence, but did not communicate.   Abdomen, mild distension.   Physical assessment: Subjectively appears to have mil temp orbital and temporal wasting, but may be how patient always presents. No wasting noted elsewhere.   Infusions: Hypotonic saline, PRBC, IV abx.   RN reports that  pt is on a CL diet but needs to be evaluated by ST due to "gurgling".   His first weight that was measured was 285 lbs. Unclear how much wt loss was due to fluid vs insufficient intake.   Labs reviewed: Worsening renal function. Albumin: 2.0, WBC: 19.1 (improved), anemia worse/hemodilution, Very hyperglymic w/ CBGs 220-300  Recent Labs Lab 12/08/15 0126  12/10/15 0432 12/11/15 0500 12/12/15 0355 12/13/15 0003 12/13/15 0411  NA 142  < > 148* 151* 151*  --  150*  K 5.0  < > 4.9 4.6 4.0  --  4.0  CL 112*  < > 117* 116* 120*  --  121*  CO2 23  < > 24 24 24   --  19*  BUN 73*  < > 55* 66* 65*  --  83*  CREATININE 2.92*  < > 2.05* 2.52* 2.72*  --  3.90*  CALCIUM 8.9  < > 9.3 9.1 8.8*  --  8.3*  MG 1.9  --   --   --   --  1.9  --   PHOS  --   --  2.6 2.6  --   --  3.2  GLUCOSE 169*  < > 182* 221* 252*  --  281*  < > = values in this interval not displayed.  Diet Order:  Diet clear liquid Room service appropriate?: Yes; Fluid consistency:: Thin  Skin:  Surgical incision to abdomen, PALLOR  Last BM:  7/9-bloody, loose  Height:  Ht Readings from Last  1 Encounters:  11/28/2015 6' 2"  (1.88 m)   Weight:  Wt Readings from Last 1 Encounters:  12/13/15 268 lb 8.3 oz (121.8 kg)   Wt Readings from Last 10 Encounters:  12/13/15 268 lb 8.3 oz (121.8 kg)  11/27/15 280 lb (127.007 kg)  08/13/15 292 lb (132.45 kg)  08/12/15 292 lb 6.4 oz (132.632 kg)  07/28/15 282 lb (127.914 kg)  06/11/15 284 lb 8 oz (129.048 kg)  03/21/14 269 lb (122.018 kg)  07/23/13 269 lb 6.4 oz (122.199 kg)  07/22/12 242 lb (109.77 kg)  02/03/12 270 lb (122.471 kg)  Lowest Weight: 261 lbs (118.64 kg)   Ideal Body Weight:  86.36 kg  BMI:  Body mass index is 34.46 kg/(m^2).  Estimated Nutritional Needs:  Kcal:  1300-1500 kcals (.65-.75% MSJ eq)  Protein:  147-172 g Pro (1.7-2 g /kg IBW) Fluid:  PER MD   EDUCATION NEEDS:  No education needs identified at this time  Burtis Junes RD, LDN, CNSC Clinical  Nutrition Pager: [phone] 12/13/2015 11:26 AM

## 2015-12-14 DIAGNOSIS — E43 Unspecified severe protein-calorie malnutrition: Secondary | ICD-10-CM | POA: Insufficient documentation

## 2015-12-14 LAB — GLUCOSE, CAPILLARY
GLUCOSE-CAPILLARY: 202 mg/dL — AB (ref 65–99)
GLUCOSE-CAPILLARY: 232 mg/dL — AB (ref 65–99)
GLUCOSE-CAPILLARY: 312 mg/dL — AB (ref 65–99)
GLUCOSE-CAPILLARY: 318 mg/dL — AB (ref 65–99)
GLUCOSE-CAPILLARY: 321 mg/dL — AB (ref 65–99)
Glucose-Capillary: 304 mg/dL — ABNORMAL HIGH (ref 65–99)
Glucose-Capillary: 382 mg/dL — ABNORMAL HIGH (ref 65–99)
Glucose-Capillary: 343 mg/dL — ABNORMAL HIGH (ref 65–99)
Glucose-Capillary: 322 mg/dL — ABNORMAL HIGH (ref 65–99)

## 2015-12-14 LAB — CBC
HEMATOCRIT: 25.9 % — AB (ref 39.0–52.0)
Hemoglobin: 8.7 g/dL — ABNORMAL LOW (ref 13.0–17.0)
MCH: 30 pg (ref 26.0–34.0)
MCHC: 33.6 g/dL (ref 30.0–36.0)
MCV: 89.3 fL (ref 78.0–100.0)
Platelets: 162 10*3/uL (ref 150–400)
RBC: 2.9 MIL/uL — AB (ref 4.22–5.81)
RDW: 15 % (ref 11.5–15.5)
WBC: 23.8 10*3/uL — AB (ref 4.0–10.5)

## 2015-12-14 LAB — COMPREHENSIVE METABOLIC PANEL
ALT: 90 U/L — AB (ref 17–63)
AST: 205 U/L — AB (ref 15–41)
Albumin: 1.8 g/dL — ABNORMAL LOW (ref 3.5–5.0)
Alkaline Phosphatase: 69 U/L (ref 38–126)
Anion gap: 6 (ref 5–15)
BILIRUBIN TOTAL: 0.7 mg/dL (ref 0.3–1.2)
BUN: 108 mg/dL — AB (ref 6–20)
CALCIUM: 8.2 mg/dL — AB (ref 8.9–10.3)
CO2: 20 mmol/L — ABNORMAL LOW (ref 22–32)
CREATININE: 4.64 mg/dL — AB (ref 0.61–1.24)
Chloride: 125 mmol/L — ABNORMAL HIGH (ref 101–111)
GFR calc Af Amer: 12 mL/min — ABNORMAL LOW (ref >60)
GFR, EST NON AFRICAN AMERICAN: 11 mL/min — AB (ref >60)
Glucose, Bld: 227 mg/dL — ABNORMAL HIGH (ref 65–99)
Potassium: 4 mmol/L (ref 3.5–5.1)
Sodium: 151 mmol/L — ABNORMAL HIGH (ref 135–145)
TOTAL PROTEIN: 5.2 g/dL — AB (ref 6.5–8.1)

## 2015-12-14 LAB — MAGNESIUM: Magnesium: 2.2 mg/dL (ref 1.7–2.4)

## 2015-12-14 LAB — TYPE AND SCREEN
ABO/RH(D): O POS
ANTIBODY SCREEN: NEGATIVE
UNIT DIVISION: 0
UNIT DIVISION: 0
Unit division: 0
Unit division: 0
Unit division: 0

## 2015-12-14 LAB — TRIGLYCERIDES: TRIGLYCERIDES: 136 mg/dL (ref <150)

## 2015-12-14 LAB — PREALBUMIN: Prealbumin: 5.1 mg/dL — ABNORMAL LOW (ref 18–38)

## 2015-12-14 LAB — PROTIME-INR
INR: 1.54 — ABNORMAL HIGH (ref 0.00–1.49)
PROTHROMBIN TIME: 18.6 s — AB (ref 11.6–15.2)

## 2015-12-14 LAB — PHOSPHORUS: Phosphorus: 3.8 mg/dL (ref 2.5–4.6)

## 2015-12-14 MED ORDER — DEXTROSE-NACL 5-0.45 % IV SOLN
INTRAVENOUS | Status: DC
  Administered 2015-12-14: 1000 mL via INTRAVENOUS

## 2015-12-14 MED ORDER — INSULIN ASPART 100 UNIT/ML ~~LOC~~ SOLN: 0.0000 [IU] | SUBCUTANEOUS | Status: DC

## 2015-12-14 MED ORDER — INSULIN ASPART 100 UNIT/ML ~~LOC~~ SOLN
0.0000 [IU] | SUBCUTANEOUS | Status: AC
  Administered 2015-12-14 (×2): 15 [IU] via SUBCUTANEOUS

## 2015-12-14 MED ORDER — INSULIN ASPART 100 UNIT/ML ~~LOC~~ SOLN
0.0000 [IU] | SUBCUTANEOUS | Status: AC
  Administered 2015-12-15: 4 [IU] via SUBCUTANEOUS
  Administered 2015-12-15: 7 [IU] via SUBCUTANEOUS

## 2015-12-14 MED ORDER — INSULIN DETEMIR 100 UNIT/ML ~~LOC~~ SOLN
25.0000 [IU] | Freq: Every day | SUBCUTANEOUS | Status: DC
  Administered 2015-12-14 – 2015-12-15 (×2): 25 [IU] via SUBCUTANEOUS
  Filled 2015-12-14 (×3): qty 0.25

## 2015-12-14 MED ORDER — DEXTROSE 5 % IV SOLN
INTRAVENOUS | Status: DC
  Administered 2015-12-14: 11:00:00 via INTRAVENOUS

## 2015-12-14 MED ORDER — TRACE MINERALS CR-CU-MN-SE-ZN 10-1000-500-60 MCG/ML IV SOLN
INTRAVENOUS | Status: AC
  Administered 2015-12-14: 18:00:00 via INTRAVENOUS
  Filled 2015-12-14: qty 840

## 2015-12-14 NOTE — Progress Notes (Signed)
1 mg ativan for mbr trying to get out of bed, wanting to go home, Several attempts to discuss staying in bed for now, do not want him to fall,

## 2015-12-14 NOTE — Progress Notes (Signed)
10 Days Post-Op  Subjective: Patient is more lethargic this morning.  Objective: Vital signs in last 24 hours: Temp:  [96.8 F (36 C)-100.2 F (37.9 C)] 96.9 F (36.1 C) (07/22 0400) Pulse Rate:  [86-128] 99 (07/22 0800) Resp:  [20-32] 25 (07/22 0800) BP: (78-159)/(36-107) 146/62 mmHg (07/22 0800) SpO2:  [91 %-100 %] 98 % (07/22 0800) Weight:  [122.6 kg (270 lb 4.5 oz)] 122.6 kg (270 lb 4.5 oz) (07/22 0400) Last BM Date: 12/13/15  Intake/Output from previous day: 07/21 0701 - 07/22 0700 In: 4194.1 [P.O.:50; I.V.:1836.3; Blood:904; IV Piggyback:150; TPN:453.8] Out: 1605 [Urine:1600; Drains:5] Intake/Output this shift: Total I/O In: 130 [I.V.:95; TPN:35] Out: 500 [Urine:500]  General appearance: fatigued and no distress Resp: clear to auscultation bilaterally Cardio: regular rate and rhythm, S1, S2 normal, no murmur, click, rub or gallop GI: Soft. Dressing dry and intact. Minimal drainage from JP drain. No rigidity noted.  Lab Results:   Recent Labs  12/13/15 0003 12/14/15 0120  WBC 19.1* 23.8*  HGB 7.2* 8.7*  HCT 21.7* 25.9*  PLT 170 162   BMET  Recent Labs  12/13/15 0411 12/14/15 0450  NA 150* 151*  K 4.0 4.0  CL 121* 125*  CO2 19* 20*  GLUCOSE 281* 227*  BUN 83* 108*  CREATININE 3.90* 4.64*  CALCIUM 8.3* 8.2*   PT/INR No results for input(s): LABPROT, INR in the last 72 hours.  Studies/Results: No results found.  Anti-infectives: Anti-infectives    Start     Dose/Rate Route Frequency Ordered Stop   12/08/15 1400  piperacillin-tazobactam (ZOSYN) IVPB 3.375 g     3.375 g 12.5 mL/hr over 240 Minutes Intravenous Every 8 hours 12/08/15 0901     12/05/15 1400  piperacillin-tazobactam (ZOSYN) 2.25 g in dextrose 5 % 50 mL IVPB  Status:  Discontinued     2.25 g 100 mL/hr over 30 Minutes Intravenous Every 8 hours 12/05/15 0810 12/08/15 0900   12/05/15 1000  piperacillin-tazobactam (ZOSYN) IVPB 2.25 g  Status:  Discontinued     2.25 g 100 mL/hr over 30  Minutes Intravenous Every 8 hours 12/05/15 0808 12/05/15 0810   12/05/2015 1800  piperacillin-tazobactam (ZOSYN) IVPB 3.375 g  Status:  Discontinued     3.375 g 12.5 mL/hr over 240 Minutes Intravenous Every 8 hours 12/03/2015 1707 12/05/15 0808   11/28/2015 1245  Ampicillin-Sulbactam (UNASYN) 3 g in sodium chloride 0.9 % 100 mL IVPB     3 g 100 mL/hr over 60 Minutes Intravenous  Once 11/28/2015 1236 12/02/2015 1329      Assessment/Plan: s/p Procedure(s): EXPLORATORY LAPAROTOMY GASTRORRHAPHY Impression: Patient's condition seems to be deteriorating. His renal failure has worsened. He appears to be uremic. His hemoglobin is stable and he had only dark bowel movements yesterday evening. His liver tests are deteriorating.  I had a long talk with the patient's wife and daughter. They note that he has refused dialysis in the past. They also state that he is DO NOT RESUSCITATE and does not want to be put back on the ventilator. I told him that I thought his condition was worsening. They fully understand that he may expire during this hospitalization. We'll continue current care. His prognosis is poor. They also state that they do not want any further surgical intervention.  LOS: 10 days    Lehman Whiteley A 12/14/2015

## 2015-12-14 NOTE — Progress Notes (Addendum)
Subjective: Interval History: . Patient remains somnolent and at this moment doesn't offer any complaints.  Objective: Vital signs in last 24 hours: Temp:  [96.8 F (36 C)-100.2 F (37.9 C)] 96.9 F (36.1 C) (07/22 0400) Pulse Rate:  [86-128] 100 (07/22 1000) Resp:  [20-32] 23 (07/22 1000) BP: (78-159)/(36-107) 137/86 mmHg (07/22 1000) SpO2:  [91 %-100 %] 98 % (07/22 1000) Weight:  [122.6 kg (270 lb 4.5 oz)] 122.6 kg (270 lb 4.5 oz) (07/22 0400) Weight change: 0.8 kg (1 lb 12.2 oz)  Intake/Output from previous day: 07/21 0701 - 07/22 0700 In: 4194.1 [P.O.:50; I.V.:1836.3; Blood:904; IV Piggyback:150; TPN:453.8] Out: 1605 [Urine:1600; Drains:5] Intake/Output this shift: Total I/O In: 870 [P.O.:480; I.V.:285; TPN:105] Out: 500 [Urine:500]  Generally patient is sleepy but arousable. He doesn't seem to be in any apparent distress. Chest is clear to auscultation Heart exam revealed regular rate and rhythm Abdomen hypoactive Extremities no edema  Lab Results:  Recent Labs  12/13/15 0003 12/14/15 0120  WBC 19.1* 23.8*  HGB 7.2* 8.7*  HCT 21.7* 25.9*  PLT 170 162   BMET:   Recent Labs  12/13/15 0411 12/14/15 0450  NA 150* 151*  K 4.0 4.0  CL 121* 125*  CO2 19* 20*  GLUCOSE 281* 227*  BUN 83* 108*  CREATININE 3.90* 4.64*  CALCIUM 8.3* 8.2*   No results for input(s): PTH in the last 72 hours. Iron Studies: No results for input(s): IRON, TIBC, TRANSFERRIN, FERRITIN in the last 72 hours.  Studies/Results: No results found.  I have reviewed the patient's current medications.  Assessment/Plan: Problem #1 acute kidney injury: Possibly prerenal versus ATN. His renal function continued to decline. He had about 1600 mL of urine output. Problem #2 hyperkalemia: Potassium remains normal. Problem #3 hypernatremia: Most likely from lack of freewater. His serum sodium continued to increase. Problem #4 history of anemia: His hemoglobin is stable. Patient is status post  blood transfusion. Problem #5 history of diabetes: His blood sugar is high this morning Problem #6 history of perforated duodenal ulcer status post surgery Problem #7 hypotension. his blood pressure has improved presently is in normal range.  Plan: 1] have discussed in detail with his wife and other family members who were with him. Presently his renal function is worsening and patient also with multiple medical problems. According to his wife patient has told her he does not want dialysis. Hence at this moment will try to manage him conservatively. 2] we'll continue his Lasix 3] we'll change his IV fluid to D5 water at 100 mL per hour 4] we'll check his renal panel in the morning.   LOS: 10 days   Kieryn Burtis S 12/14/2015,10:20 AM

## 2015-12-14 NOTE — Progress Notes (Signed)
PARENTERAL NUTRITION CONSULT NOTE  Pharmacy Consult for TPN Indication: intolerance to enteral feeding  No Known Allergies  Patient Measurements: Height: 6\' 2"  (188 cm) Weight: 270 lb 4.5 oz (122.6 kg) IBW/kg (Calculated) : 82.2   Vital Signs: Temp: 96.9 F (36.1 C) (07/22 0400) Temp Source: Axillary (07/22 0400) BP: 146/62 mmHg (07/22 0800) Pulse Rate: 99 (07/22 0800) Intake/Output from previous day: 07/21 0701 - 07/22 0700 In: 4194.1 [P.O.:50; I.V.:1836.3; Blood:904; IV Piggyback:150; TPN:453.8] Out: 1605 [Urine:1600; Drains:5] Intake/Output from this shift: Total I/O In: 130 [I.V.:95; TPN:35] Out: 500 [Urine:500]  Labs:  Recent Labs  12/12/15 1656 12/13/15 0003 12/14/15 0120  WBC 22.1* 19.1* 23.8*  HGB 8.5* 7.2* 8.7*  HCT 25.7* 21.7* 25.9*  PLT 189 170 162     Recent Labs  12/12/15 0355 12/13/15 0003 12/13/15 0411 12/14/15 0450  NA 151*  --  150* 151*  K 4.0  --  4.0 4.0  CL 120*  --  121* 125*  CO2 24  --  19* 20*  GLUCOSE 252*  --  281* 227*  BUN 65*  --  83* 108*  CREATININE 2.72*  --  3.90* 4.64*  CALCIUM 8.8*  --  8.3* 8.2*  MG  --  1.9  --  2.2  PHOS  --   --  3.2 3.8  PROT  --   --  5.8* 5.2*  ALBUMIN  --   --  2.0* 1.8*  AST  --   --  59* 205*  ALT  --   --  25 90*  ALKPHOS  --   --  67 69  BILITOT  --   --  0.6 0.7  TRIG  --   --   --  136   Estimated Creatinine Clearance: 17.7 mL/min (by C-G formula based on Cr of 4.64).    Recent Labs  12/13/15 2348 12/14/15 0429 12/14/15 0740  GLUCAP 244* 202* 232*    Medical History: Past Medical History  Diagnosis Date  . Hypertension   . Hyperlipidemia   . Diabetes mellitus, type II, insulin dependent (HCC)   . Coronary atherosclerosis of native coronary artery 2003    Multivessel s/p CABG  . Morbid obesity (HCC)   . GERD (gastroesophageal reflux disease)   . History of tobacco abuse   . Back pain   . Hard of hearing   . Aortic root dilatation (HCC)   . CKD (chronic kidney  disease) stage 4, GFR 15-29 ml/min (HCC)     Medications:  Prescriptions prior to admission  Medication Sig Dispense Refill Last Dose  . aspirin EC 81 MG tablet Take 81 mg by mouth daily.   12/03/2015 at Unknown time  . atorvastatin (LIPITOR) 20 MG tablet TAKE 1 TABLET BY MOUTH AT BEDTIME FOR CHOLESTEROL. 30 tablet 0 12/03/2015 at Unknown time  . furosemide (LASIX) 20 MG tablet Take 2 tablets (40 mg total) by mouth daily. Restart on 3/14 30 tablet  12/03/2015 at Unknown time  . gabapentin (NEURONTIN) 100 MG capsule Take 100 mg by mouth 3 (three) times daily.   12/03/2015 at Unknown time  . insulin detemir (LEVEMIR) 100 UNIT/ML injection Inject 0.25 mLs (25 Units total) into the skin daily.   12/03/2015 at Unknown time  . naproxen (NAPROSYN) 500 MG tablet Take 500 mg by mouth 2 (two) times daily with a meal.    Past Week at Unknown time  . pantoprazole (PROTONIX) 40 MG tablet Take 1 tablet (40 mg total) by mouth  2 (two) times daily before a meal. 180 tablet 3 12/03/2015 at Unknown time  . pioglitazone (ACTOS) 45 MG tablet Take 45 mg by mouth daily.    12/03/2015 at Unknown time  . potassium chloride (MICRO-K) 10 MEQ CR capsule Take 1 capsule (10 mEq total) by mouth daily. Restart on 3/14   12/03/2015 at Unknown time  . SPIRIVA RESPIMAT 2.5 MCG/ACT AERS Inhale 1 puff into the lungs daily.    12/03/2015 at Unknown time  . [DISCONTINUED] HYDROcodone-acetaminophen (NORCO) 7.5-325 MG tablet Take 1 tablet by mouth every 4 (four) hours as needed for moderate pain (Must last 30 days.  Do not drive or operate machinery while taking this medicine.). 120 tablet 0 12/03/2015 at Unknown time  . albuterol (PROVENTIL HFA;VENTOLIN HFA) 108 (90 BASE) MCG/ACT inhaler Inhale 2 puffs into the lungs every 4 (four) hours as needed for wheezing or shortness of breath.   unknown  . albuterol (PROVENTIL) (2.5 MG/3ML) 0.083% nebulizer solution Take 3 mLs (2.5 mg total) by nebulization every 6 (six) hours as needed for wheezing or  shortness of breath. 75 mL 12 unknown  . citalopram (CELEXA) 40 MG tablet Take 0.5 tablets (20 mg total) by mouth daily. (Patient not taking: Reported on 12/23/2015) 30 tablet 1 Taking  . metoprolol tartrate (LOPRESSOR) 25 MG tablet Take 0.5 tablets (12.5 mg total) by mouth 2 (two) times daily. (Patient not taking: Reported on 12/07/2015) 30 tablet 1 Taking    Insulin Requirements in the past 24 hours:  Levemir 10 units, 39 units ssi since TPN started, 10 units insulin in TPN  Current Nutrition:  Full liquid diet, Prostat 30 ml bid, Clinimix 5/15 at 35 ml/hr  Assessment: 82 yo man s/p repair of duodenal perforation with active GI bleed and symptomatic anemia to start TPN.  He has hypernatremia, elevated BUN, elevated SrCr and elevated glucoses.  CBGs remain elevated.  Hypernatremia still noted.  Unable to adjust Na in premixed TPN formula TGs 136, AST and ALT elevated   Nutritional Goals:  1300-1500 kCal,  147-172 grams of protein per day per RD  Plan:  Increase levemir 25 units sq daily Increase regular insulin in TPN to 15 units Cont  prostat 30 ml bid Will not advance TPN rate until cbgs under better control Cont to hold lipids F/u am labs and cbgs  Redith Drach Poteet 12/14/2015,8:38 AM

## 2015-12-15 LAB — HEPATIC FUNCTION PANEL
ALBUMIN: 1.7 g/dL — AB (ref 3.5–5.0)
ALK PHOS: 69 U/L (ref 38–126)
ALT: 99 U/L — AB (ref 17–63)
AST: 119 U/L — AB (ref 15–41)
Bilirubin, Direct: 0.2 mg/dL (ref 0.1–0.5)
Indirect Bilirubin: 0.4 mg/dL (ref 0.3–0.9)
TOTAL PROTEIN: 5.3 g/dL — AB (ref 6.5–8.1)
Total Bilirubin: 0.6 mg/dL (ref 0.3–1.2)

## 2015-12-15 LAB — BASIC METABOLIC PANEL
ANION GAP: 6 (ref 5–15)
BUN: 115 mg/dL — ABNORMAL HIGH (ref 6–20)
CHLORIDE: 116 mmol/L — AB (ref 101–111)
CO2: 19 mmol/L — AB (ref 22–32)
Calcium: 7.8 mg/dL — ABNORMAL LOW (ref 8.9–10.3)
Creatinine, Ser: 4.94 mg/dL — ABNORMAL HIGH (ref 0.61–1.24)
GFR calc non Af Amer: 10 mL/min — ABNORMAL LOW (ref >60)
GFR, EST AFRICAN AMERICAN: 12 mL/min — AB (ref >60)
GLUCOSE: 285 mg/dL — AB (ref 65–99)
Potassium: 3.4 mmol/L — ABNORMAL LOW (ref 3.5–5.1)
Sodium: 141 mmol/L (ref 135–145)

## 2015-12-15 LAB — GLUCOSE, CAPILLARY
GLUCOSE-CAPILLARY: 298 mg/dL — AB (ref 65–99)
Glucose-Capillary: 182 mg/dL — ABNORMAL HIGH (ref 65–99)
Glucose-Capillary: 231 mg/dL — ABNORMAL HIGH (ref 65–99)
Glucose-Capillary: 241 mg/dL — ABNORMAL HIGH (ref 65–99)
Glucose-Capillary: 271 mg/dL — ABNORMAL HIGH (ref 65–99)

## 2015-12-15 LAB — CBC
HEMATOCRIT: 24.1 % — AB (ref 39.0–52.0)
HEMOGLOBIN: 8 g/dL — AB (ref 13.0–17.0)
MCH: 30.3 pg (ref 26.0–34.0)
MCHC: 33.2 g/dL (ref 30.0–36.0)
MCV: 91.3 fL (ref 78.0–100.0)
Platelets: 180 10*3/uL (ref 150–400)
RBC: 2.64 MIL/uL — ABNORMAL LOW (ref 4.22–5.81)
RDW: 14.4 % (ref 11.5–15.5)
WBC: 22.5 10*3/uL — AB (ref 4.0–10.5)

## 2015-12-15 MED ORDER — SODIUM CHLORIDE 0.45 % IV SOLN
INTRAVENOUS | Status: DC
  Administered 2015-12-15 – 2015-12-16 (×2): via INTRAVENOUS
  Filled 2015-12-15 (×8): qty 1000

## 2015-12-15 MED ORDER — INSULIN DETEMIR 100 UNIT/ML ~~LOC~~ SOLN
35.0000 [IU] | Freq: Every day | SUBCUTANEOUS | Status: DC
  Administered 2015-12-16: 35 [IU] via SUBCUTANEOUS
  Filled 2015-12-15 (×2): qty 0.35

## 2015-12-15 MED ORDER — TRACE MINERALS CR-CU-MN-SE-ZN 10-1000-500-60 MCG/ML IV SOLN
INTRAVENOUS | Status: AC
  Administered 2015-12-15: 17:00:00 via INTRAVENOUS
  Filled 2015-12-15: qty 840

## 2015-12-15 MED ORDER — INSULIN ASPART 100 UNIT/ML ~~LOC~~ SOLN
0.0000 [IU] | SUBCUTANEOUS | Status: DC
  Administered 2015-12-15 – 2015-12-16 (×4): 11 [IU] via SUBCUTANEOUS
  Administered 2015-12-16: 15 [IU] via SUBCUTANEOUS
  Administered 2015-12-16: 11 [IU] via SUBCUTANEOUS
  Administered 2015-12-16 (×2): 7 [IU] via SUBCUTANEOUS
  Administered 2015-12-17: 11 [IU] via SUBCUTANEOUS
  Administered 2015-12-17 (×2): 15 [IU] via SUBCUTANEOUS
  Administered 2015-12-17: 7 [IU] via SUBCUTANEOUS

## 2015-12-15 NOTE — Progress Notes (Signed)
Subjective: Interval History: . Patient barely talks and complaints of cough. Denies any difficulty breathing.  Objective: Vital signs in last 24 hours: Temp:  [97 F (36.1 C)-98.9 F (37.2 C)] 97 F (36.1 C) (07/23 0400) Pulse Rate:  [58-109] 58 (07/23 0700) Resp:  [20-30] 20 (07/23 0700) BP: (82-142)/(46-98) 116/62 (07/23 0700) SpO2:  [98 %-100 %] 98 % (07/23 0700) Weight:  [121.6 kg (268 lb 1.3 oz)] 121.6 kg (268 lb 1.3 oz) (07/23 0500) Weight change: -1 kg (-2 lb 3.3 oz)  Intake/Output from previous day: 07/22 0701 - 07/23 0700 In: 3544.3 [P.O.:1480; I.V.:1529.3; IV Piggyback:150; TPN:385] Out: 2395 [Urine:2350; Drains:45] Intake/Output this shift: No intake/output data recorded.  Generally patient is sleepy but arousable. He doesn't seem to be in any apparent distress. Chest: He has bilateral expiratory wheezing. Heart exam revealed regular rate and rhythm Abdomen hypoactive Extremities no edema  Lab Results:  Recent Labs  12/14/15 0120 12/15/15 0030  WBC 23.8* 22.5*  HGB 8.7* 8.0*  HCT 25.9* 24.1*  PLT 162 180   BMET:   Recent Labs  12/14/15 0450 12/15/15 0030  NA 151* 141  K 4.0 3.4*  CL 125* 116*  CO2 20* 19*  GLUCOSE 227* 285*  BUN 108* 115*  CREATININE 4.64* 4.94*  CALCIUM 8.2* 7.8*   No results for input(s): PTH in the last 72 hours. Iron Studies: No results for input(s): IRON, TIBC, TRANSFERRIN, FERRITIN in the last 72 hours.  Studies/Results: No results found.  I have reviewed the patient's current medications.  Assessment/Plan: Problem #1 acute kidney injury: Possibly prerenal versus ATN. His renal function continued to decline. His urine output however has improved. He had 2300 mL of urine output the last 24 hours. He is on Lasix. Problem #2 hyperkalemia: Potassium has declined. Problem #3 hypernatremia: Hypernatremia has improved. Problem #4 history of anemia: His hemoglobin is stable. Patient is status post blood  transfusion. Problem #5 history of diabetes: His blood sugar is high this morning Problem #6 history of perforated duodenal ulcer status post surgery Problem #7 hypotension. his blood pressure has improved presently is in normal range.  Plan: 1] we'll change IV fluid to half normal saline with 10 mEq of KCl at 125 mL per hour 2] continue his Lasix 3] we'll check his renal panel in the morning.   LOS: 11 days   Gaynor Genco S 12/15/2015,8:44 AM   Visit in the

## 2015-12-15 NOTE — Progress Notes (Signed)
11 Days Post-Op  Subjective: Patient resting comfortably. Is arousable.  Objective: Vital signs in last 24 hours: Temp:  [97 F (36.1 C)-98.9 F (37.2 C)] 97 F (36.1 C) (07/23 0400) Pulse Rate:  [58-109] 94 (07/23 1000) Resp:  [20-30] 25 (07/23 1000) BP: (82-148)/(46-98) 134/61 (07/23 1000) SpO2:  [97 %-100 %] 100 % (07/23 1000) Weight:  [121.6 kg (268 lb 1.3 oz)] 121.6 kg (268 lb 1.3 oz) (07/23 0500) Last BM Date: 12/15/15  Intake/Output from previous day: 07/22 0701 - 07/23 0700 In: 3544.3 [P.O.:1480; I.V.:1529.3; IV Piggyback:150; TPN:385] Out: 2395 [Urine:2350; Drains:45] Intake/Output this shift: Total I/O In: 0  Out: 15 [Drains:15]  General appearance: fatigued and no distress Resp: Poor inspiratory effort, though no frank rales were appreciated to my exam. Cardio: regular rate and rhythm, S1, S2 normal, no murmur, click, rub or gallop GI: Soft, nontender, nondistended. Incision healing well. JP drainage minimal. No bile present.  Lab Results:   Recent Labs  12/14/15 0120 12/15/15 0030  WBC 23.8* 22.5*  HGB 8.7* 8.0*  HCT 25.9* 24.1*  PLT 162 180   BMET  Recent Labs  12/14/15 0450 12/15/15 0030  NA 151* 141  K 4.0 3.4*  CL 125* 116*  CO2 20* 19*  GLUCOSE 227* 285*  BUN 108* 115*  CREATININE 4.64* 4.94*  CALCIUM 8.2* 7.8*   PT/INR  Recent Labs  12/14/15 0923  LABPROT 18.6*  INR 1.54*    Studies/Results: No results found.  Anti-infectives: Anti-infectives    Start     Dose/Rate Route Frequency Ordered Stop   12/08/15 1400  piperacillin-tazobactam (ZOSYN) IVPB 3.375 g     3.375 g 12.5 mL/hr over 240 Minutes Intravenous Every 8 hours 12/08/15 0901     12/05/15 1400  piperacillin-tazobactam (ZOSYN) 2.25 g in dextrose 5 % 50 mL IVPB  Status:  Discontinued     2.25 g 100 mL/hr over 30 Minutes Intravenous Every 8 hours 12/05/15 0810 12/08/15 0900   12/05/15 1000  piperacillin-tazobactam (ZOSYN) IVPB 2.25 g  Status:  Discontinued     2.25  g 100 mL/hr over 30 Minutes Intravenous Every 8 hours 12/05/15 0808 12/05/15 0810   12-05-15 1800  piperacillin-tazobactam (ZOSYN) IVPB 3.375 g  Status:  Discontinued     3.375 g 12.5 mL/hr over 240 Minutes Intravenous Every 8 hours 2015/12/05 1707 12/05/15 0808   12/05/15 1245  Ampicillin-Sulbactam (UNASYN) 3 g in sodium chloride 0.9 % 100 mL IVPB     3 g 100 mL/hr over 60 Minutes Intravenous  Once 05-Dec-2015 1236 Dec 05, 2015 1329      Assessment/Plan: s/p Procedure(s): EXPLORATORY LAPAROTOMY GASTRORRHAPHY Impression: Evidence of multi-organ failure declining. Blood glucoses much better this morning. Renal function continues to worsen, though it is nonoliguric. Appreciate nephrology input. Discussed with family. Prognosis is still poor.  LOS: 11 days    Shalimar Mcclain A 12/15/2015

## 2015-12-15 NOTE — Progress Notes (Addendum)
PARENTERAL NUTRITION CONSULT NOTE  Pharmacy Consult for TPN Indication: intolerance to enteral feeding  No Known Allergies  Patient Measurements: Height: 6\' 2"  (188 cm) Weight: 268 lb 1.3 oz (121.6 kg) IBW/kg (Calculated) : 82.2   Vital Signs: Temp: 97 F (36.1 C) (07/23 0400) Temp Source: Axillary (07/23 0400) BP: 116/62 (07/23 0700) Pulse Rate: 58 (07/23 0700) Intake/Output from previous day: 07/22 0701 - 07/23 0700 In: 3544.3 [P.O.:1480; I.V.:1529.3; IV Piggyback:150; TPN:385] Out: 2395 [Urine:2350; Drains:45] Intake/Output from this shift: No intake/output data recorded.  Labs:  Recent Labs  12/13/15 0003 12/14/15 0120 12/14/15 0923 12/15/15 0030  WBC 19.1* 23.8*  --  22.5*  HGB 7.2* 8.7*  --  8.0*  HCT 21.7* 25.9*  --  24.1*  PLT 170 162  --  180  INR  --   --  1.54*  --      Recent Labs  12/13/15 0003 12/13/15 0411 12/14/15 0450 12/15/15 0030  NA  --  150* 151* 141  K  --  4.0 4.0 3.4*  CL  --  121* 125* 116*  CO2  --  19* 20* 19*  GLUCOSE  --  281* 227* 285*  BUN  --  83* 108* 115*  CREATININE  --  3.90* 4.64* 4.94*  CALCIUM  --  8.3* 8.2* 7.8*  MG 1.9  --  2.2  --   PHOS  --  3.2 3.8  --   PROT  --  5.8* 5.2* 5.3*  ALBUMIN  --  2.0* 1.8* 1.7*  AST  --  59* 205* 119*  ALT  --  25 90* 99*  ALKPHOS  --  67 69 69  BILITOT  --  0.6 0.7 0.6  BILIDIR  --   --   --  0.2  IBILI  --   --   --  0.4  PREALBUMIN  --   --  5.1*  --   TRIG  --   --  136  --    Estimated Creatinine Clearance: 16.5 mL/min (by C-G formula based on SCr of 4.94 mg/dL).    Recent Labs  12/14/15 2347 12/15/15 0419 12/15/15 0757  GLUCAP 312* 182* 231*    Medical History: Past Medical History:  Diagnosis Date  . Aortic root dilatation (HCC)   . Back pain   . CKD (chronic kidney disease) stage 4, GFR 15-29 ml/min (HCC)   . Coronary atherosclerosis of native coronary artery 2003   Multivessel s/p CABG  . Diabetes mellitus, type II, insulin dependent (HCC)   . GERD  (gastroesophageal reflux disease)   . Hard of hearing   . History of tobacco abuse   . Hyperlipidemia   . Hypertension   . Morbid obesity (HCC)     Medications:  Prescriptions Prior to Admission  Medication Sig Dispense Refill Last Dose  . aspirin EC 81 MG tablet Take 81 mg by mouth daily.   12/03/2015 at Unknown time  . atorvastatin (LIPITOR) 20 MG tablet TAKE 1 TABLET BY MOUTH AT BEDTIME FOR CHOLESTEROL. 30 tablet 0 12/03/2015 at Unknown time  . furosemide (LASIX) 20 MG tablet Take 2 tablets (40 mg total) by mouth daily. Restart on 3/14 30 tablet  12/03/2015 at Unknown time  . gabapentin (NEURONTIN) 100 MG capsule Take 100 mg by mouth 3 (three) times daily.   12/03/2015 at Unknown time  . insulin detemir (LEVEMIR) 100 UNIT/ML injection Inject 0.25 mLs (25 Units total) into the skin daily.   12/03/2015 at Unknown  time  . naproxen (NAPROSYN) 500 MG tablet Take 500 mg by mouth 2 (two) times daily with a meal.    Past Week at Unknown time  . pantoprazole (PROTONIX) 40 MG tablet Take 1 tablet (40 mg total) by mouth 2 (two) times daily before a meal. 180 tablet 3 12/03/2015 at Unknown time  . pioglitazone (ACTOS) 45 MG tablet Take 45 mg by mouth daily.    12/03/2015 at Unknown time  . potassium chloride (MICRO-K) 10 MEQ CR capsule Take 1 capsule (10 mEq total) by mouth daily. Restart on 3/14   12/03/2015 at Unknown time  . SPIRIVA RESPIMAT 2.5 MCG/ACT AERS Inhale 1 puff into the lungs daily.    12/03/2015 at Unknown time  . [DISCONTINUED] HYDROcodone-acetaminophen (NORCO) 7.5-325 MG tablet Take 1 tablet by mouth every 4 (four) hours as needed for moderate pain (Must last 30 days.  Do not drive or operate machinery while taking this medicine.). 120 tablet 0 12/03/2015 at Unknown time  . albuterol (PROVENTIL HFA;VENTOLIN HFA) 108 (90 BASE) MCG/ACT inhaler Inhale 2 puffs into the lungs every 4 (four) hours as needed for wheezing or shortness of breath.   unknown  . albuterol (PROVENTIL) (2.5 MG/3ML) 0.083%  nebulizer solution Take 3 mLs (2.5 mg total) by nebulization every 6 (six) hours as needed for wheezing or shortness of breath. 75 mL 12 unknown  . citalopram (CELEXA) 40 MG tablet Take 0.5 tablets (20 mg total) by mouth daily. (Patient not taking: Reported on 11/25/2015) 30 tablet 1 Taking  . metoprolol tartrate (LOPRESSOR) 25 MG tablet Take 0.5 tablets (12.5 mg total) by mouth 2 (two) times daily. (Patient not taking: Reported on 12/21/2015) 30 tablet 1 Taking    Insulin Requirements in the past 24 hours:  Levemir 25 units, 68 units ssi , 15 units insulin in TPN  Current Nutrition:  Full liquid diet, Prostat 30 ml bid ordered but not receiving due to swallowing issues, Clinimix 5/15 at 35 ml/hr  Assessment: 82 yo man s/p repair of duodenal perforation with active GI bleed and symptomatic anemia to start TPN. Na is now normal.  K slightly low at 3.4.  CBGs remain elevated despite increases in levemir and insulin in TPN. SrCr and BUN continue to rise.  Prealb is 5.1    Nutritional Goals:  1300-1500 kCal,  147-172 grams of protein per day per RD  Plan:  Increase levemir 35 units sq daily Increase regular insulin in TPN to 30 units Cont  prostat 30 ml bid when able to swallow Will not advance TPN rate until cbgs under better control Cont to hold lipids F/u am labs and cbgs  Ayaan Shutes Poteet 12/15/2015,8:15 AM

## 2015-12-16 LAB — DIFFERENTIAL
BASOS ABS: 0 10*3/uL (ref 0.0–0.1)
BASOS PCT: 0 %
EOS PCT: 4 %
Eosinophils Absolute: 0.7 10*3/uL (ref 0.0–0.7)
LYMPHS PCT: 8 %
Lymphs Abs: 1.3 10*3/uL (ref 0.7–4.0)
MONOS PCT: 9 %
Monocytes Absolute: 1.5 10*3/uL — ABNORMAL HIGH (ref 0.1–1.0)
Neutro Abs: 12.8 10*3/uL — ABNORMAL HIGH (ref 1.7–7.7)
Neutrophils Relative %: 79 %

## 2015-12-16 LAB — TRIGLYCERIDES: Triglycerides: 134 mg/dL (ref <150)

## 2015-12-16 LAB — RENAL FUNCTION PANEL
ALBUMIN: 1.7 g/dL — AB (ref 3.5–5.0)
ANION GAP: 8 (ref 5–15)
BUN: 112 mg/dL — AB (ref 6–20)
CALCIUM: 7.8 mg/dL — AB (ref 8.9–10.3)
CO2: 19 mmol/L — AB (ref 22–32)
Chloride: 111 mmol/L (ref 101–111)
Creatinine, Ser: 5.01 mg/dL — ABNORMAL HIGH (ref 0.61–1.24)
GFR calc Af Amer: 11 mL/min — ABNORMAL LOW (ref >60)
GFR calc non Af Amer: 10 mL/min — ABNORMAL LOW (ref >60)
GLUCOSE: 220 mg/dL — AB (ref 65–99)
PHOSPHORUS: 3.5 mg/dL (ref 2.5–4.6)
Potassium: 3.5 mmol/L (ref 3.5–5.1)
SODIUM: 138 mmol/L (ref 135–145)

## 2015-12-16 LAB — CBC
HEMATOCRIT: 21.6 % — AB (ref 39.0–52.0)
HEMOGLOBIN: 7.2 g/dL — AB (ref 13.0–17.0)
MCH: 30.5 pg (ref 26.0–34.0)
MCHC: 33.3 g/dL (ref 30.0–36.0)
MCV: 91.5 fL (ref 78.0–100.0)
Platelets: 203 10*3/uL (ref 150–400)
RBC: 2.36 MIL/uL — AB (ref 4.22–5.81)
RDW: 14 % (ref 11.5–15.5)
WBC: 16.3 10*3/uL — AB (ref 4.0–10.5)

## 2015-12-16 LAB — GLUCOSE, CAPILLARY
GLUCOSE-CAPILLARY: 228 mg/dL — AB (ref 65–99)
GLUCOSE-CAPILLARY: 263 mg/dL — AB (ref 65–99)
GLUCOSE-CAPILLARY: 273 mg/dL — AB (ref 65–99)
Glucose-Capillary: 221 mg/dL — ABNORMAL HIGH (ref 65–99)
Glucose-Capillary: 251 mg/dL — ABNORMAL HIGH (ref 65–99)
Glucose-Capillary: 307 mg/dL — ABNORMAL HIGH (ref 65–99)

## 2015-12-16 LAB — COMPREHENSIVE METABOLIC PANEL
ALBUMIN: 1.6 g/dL — AB (ref 3.5–5.0)
ALT: 92 U/L — ABNORMAL HIGH (ref 17–63)
ANION GAP: 5 (ref 5–15)
AST: 107 U/L — ABNORMAL HIGH (ref 15–41)
Alkaline Phosphatase: 74 U/L (ref 38–126)
BILIRUBIN TOTAL: 0.5 mg/dL (ref 0.3–1.2)
BUN: 116 mg/dL — ABNORMAL HIGH (ref 6–20)
CO2: 21 mmol/L — ABNORMAL LOW (ref 22–32)
Calcium: 7.6 mg/dL — ABNORMAL LOW (ref 8.9–10.3)
Chloride: 111 mmol/L (ref 101–111)
Creatinine, Ser: 4.97 mg/dL — ABNORMAL HIGH (ref 0.61–1.24)
GFR calc Af Amer: 11 mL/min — ABNORMAL LOW (ref >60)
GFR, EST NON AFRICAN AMERICAN: 10 mL/min — AB (ref >60)
GLUCOSE: 243 mg/dL — AB (ref 65–99)
POTASSIUM: 3.5 mmol/L (ref 3.5–5.1)
Sodium: 137 mmol/L (ref 135–145)
Total Protein: 5.3 g/dL — ABNORMAL LOW (ref 6.5–8.1)

## 2015-12-16 LAB — PHOSPHORUS: PHOSPHORUS: 3.9 mg/dL (ref 2.5–4.6)

## 2015-12-16 LAB — C DIFFICILE QUICK SCREEN W PCR REFLEX
C DIFFICILE (CDIFF) INTERP: NOT DETECTED
C DIFFICILE (CDIFF) TOXIN: NEGATIVE
C DIFFICLE (CDIFF) ANTIGEN: NEGATIVE

## 2015-12-16 LAB — PREALBUMIN: PREALBUMIN: 4.8 mg/dL — AB (ref 18–38)

## 2015-12-16 LAB — MAGNESIUM: Magnesium: 1.8 mg/dL (ref 1.7–2.4)

## 2015-12-16 MED ORDER — PIPERACILLIN SOD-TAZOBACTAM SO 2.25 (2-0.25) G IV SOLR
2.2500 g | Freq: Four times a day (QID) | INTRAVENOUS | Status: DC
  Administered 2015-12-16 – 2015-12-17 (×4): 2.25 g via INTRAVENOUS
  Filled 2015-12-16 (×9): qty 2.25

## 2015-12-16 MED ORDER — TRACE MINERALS CR-CU-MN-SE-ZN 10-1000-500-60 MCG/ML IV SOLN
INTRAVENOUS | Status: DC
  Administered 2015-12-16: 18:00:00 via INTRAVENOUS
  Filled 2015-12-16: qty 1200

## 2015-12-16 MED ORDER — PIPERACILLIN-TAZOBACTAM IN DEX 2-0.25 GM/50ML IV SOLN
2.2500 g | Freq: Three times a day (TID) | INTRAVENOUS | Status: DC
  Filled 2015-12-16 (×7): qty 50

## 2015-12-16 MED ORDER — FUROSEMIDE 10 MG/ML IJ SOLN
20.0000 mg | Freq: Two times a day (BID) | INTRAMUSCULAR | Status: DC
  Administered 2015-12-16 – 2015-12-17 (×2): 20 mg via INTRAVENOUS
  Filled 2015-12-16 (×2): qty 2

## 2015-12-16 NOTE — Progress Notes (Signed)
Pharmacy Antibiotic Note  [name] is a 82 y.o. male admitted on 01-03-16 with intr-abdominal infection.  Pharmacy has been consulted for zosyn  dosing. CT scan the abdomen revealed perforated viscus, consistent with perforated duodenal ulcer. Pt had exploratory laparotomy.   SCr worse.  Normalized clcr ~ 28ml/min. WBC elevated but improving.  Afebrile.   Day #12 ZOSYN  Plan: CHANGE Zosyn to 2.25gm IV q8h due to worsening renal fxn (clcr < 20) F/U cultures and duration of therapy per MD plans Consider d/c Zosyn and observe (has been treated > 10 days) Monitor labs and renal function  Height: 6\' 2"  (188 cm) Weight: 273 lb 5.9 oz (124 kg) IBW/kg (Calculated) : 82.2  Temp (24hrs), Avg:99 F (37.2 C), Min:98.5 F (36.9 C), Max:99.8 F (37.7 C)   Recent Labs Lab 12/12/15 1656 12/13/15 0003 12/13/15 0411 12/14/15 0120 12/14/15 0450 12/15/15 0030 12/16/15 0431 12/16/15 0947  WBC 22.1* 19.1*  --  23.8*  --  22.5* 16.3*  --   CREATININE  --   --  3.90*  --  4.64* 4.94* 4.97* 5.01*    Estimated Creatinine Clearance: 16.5 mL/min (by C-G formula based on SCr of 5.01 mg/dL).    No Known Allergies  Antimicrobials this admission: Zosyn 7/12 >>  Unasyn x 1 dose 7/12  Recent Results (from the past 240 hour(s))  C difficile quick scan w PCR reflex     Status: None   Collection Time: 12/16/15  8:38 AM  Result Value Ref Range Status   C Diff antigen NEGATIVE NEGATIVE Final   C Diff toxin NEGATIVE NEGATIVE Final   C Diff interpretation No C. difficile detected.  Final   Thank you for allowing pharmacy to be a part of this patient's care.  Valrie Hart, PharmD Clinical Pharmacist Pager:  [phone] 12/16/2015 11:37 AM

## 2015-12-16 NOTE — Progress Notes (Signed)
12 Days Post-Op  Subjective: Patient arousable, but resting comfortably.  Objective: Vital signs in last 24 hours: Temp:  [98.5 F (36.9 C)-99.8 F (37.7 C)] 99.8 F (37.7 C) (07/24 0730) Pulse Rate:  [47-109] 98 (07/24 1200) Resp:  [20-37] 26 (07/24 1200) BP: (88-144)/(53-79) 125/68 (07/24 1200) SpO2:  [98 %-100 %] 100 % (07/24 1200) Weight:  [124 kg (273 lb 5.9 oz)] 124 kg (273 lb 5.9 oz) (07/24 0500) Last BM Date: 12/16/15  Intake/Output from previous day: 07/23 0701 - 07/24 0700 In: 3877.5 [P.O.:980; I.V.:2835; IV Piggyback:62.5] Out: 3618 [Urine:3600; Drains:15; Stool:3] Intake/Output this shift: Total I/O In: 375 [I.V.:375] Out: 1285 [Urine:1075; Drains:210]  General appearance: no distress Resp: clear to auscultation bilaterally Cardio: regular rate and rhythm, S1, S2 normal, no murmur, click, rub or gallop GI: Soft, incision healing well. JP drainage minimal. No rigidity noted. No significant distention noted. Active bowel sounds appreciated.  Lab Results:   Recent Labs  12/15/15 0030 12/16/15 0431  WBC 22.5* 16.3*  HGB 8.0* 7.2*  HCT 24.1* 21.6*  PLT 180 203   BMET  Recent Labs  12/16/15 0431 12/16/15 0947  NA 137 138  K 3.5 3.5  CL 111 111  CO2 21* 19*  GLUCOSE 243* 220*  BUN 116* 112*  CREATININE 4.97* 5.01*  CALCIUM 7.6* 7.8*   PT/INR  Recent Labs  12/14/15 0923  LABPROT 18.6*  INR 1.54*    Studies/Results: No results found.  Anti-infectives: Anti-infectives    Start     Dose/Rate Route Frequency Ordered Stop   12/16/15 1400  piperacillin-tazobactam (ZOSYN) IVPB 2.25 g     2.25 g 100 mL/hr over 30 Minutes Intravenous Every 8 hours 12/16/15 1136     12/16/15 1400  piperacillin-tazobactam (ZOSYN) 2.25 g in dextrose 5 % 50 mL IVPB     2.25 g 100 mL/hr over 30 Minutes Intravenous Every 6 hours 12/16/15 1142     12/08/15 1400  piperacillin-tazobactam (ZOSYN) IVPB 3.375 g  Status:  Discontinued     3.375 g 12.5 mL/hr over 240  Minutes Intravenous Every 8 hours 12/08/15 0901 12/16/15 1135   12/05/15 1400  piperacillin-tazobactam (ZOSYN) 2.25 g in dextrose 5 % 50 mL IVPB  Status:  Discontinued     2.25 g 100 mL/hr over 30 Minutes Intravenous Every 8 hours 12/05/15 0810 12/08/15 0900   12/05/15 1000  piperacillin-tazobactam (ZOSYN) IVPB 2.25 g  Status:  Discontinued     2.25 g 100 mL/hr over 30 Minutes Intravenous Every 8 hours 12/05/15 0808 12/05/15 0810   Dec 30, 2015 1800  piperacillin-tazobactam (ZOSYN) IVPB 3.375 g  Status:  Discontinued     3.375 g 12.5 mL/hr over 240 Minutes Intravenous Every 8 hours Dec 30, 2015 1707 12/05/15 0808   2015/12/30 1245  Ampicillin-Sulbactam (UNASYN) 3 g in sodium chloride 0.9 % 100 mL IVPB     3 g 100 mL/hr over 60 Minutes Intravenous  Once December 30, 2015 1236 2015-12-30 1329      Assessment/Plan: s/p Procedure(s): EXPLORATORY LAPAROTOMY GASTRORRHAPHY Impression: No significant change over the past 24 hours. He continues to be anemic, but will not require Orr blood transfusion today. His leukocytosis is mildly improving. Renal function is about the same as yesterday. Nonoliguric.  Plan: Continue current management. Prognosis continues to be poor and guarded.  LOS: 12 days    [name] 12/16/2015

## 2015-12-16 NOTE — Progress Notes (Signed)
Patient heart rate sustaining in 150's. Respirations 40-50 min. Patient is agitated and restless. PRN IV ativan administered. Dr. Lovell Sheehan paged to make aware.

## 2015-12-16 NOTE — Progress Notes (Signed)
PARENTERAL NUTRITION CONSULT NOTE  Pharmacy Consult for TPN Indication: intolerance to enteral feeding   No Known Allergies  Patient Measurements: Height:  (188 cm) Weight: 273 lb 5.9 oz (124 kg) IBW/kg (Calculated) : 82.2  Vital Signs: Temp: 99.8 F (37.7 C) (07/24 0730) Temp Source: Axillary (07/24 0730) BP: 144/79 (07/24 1000) Pulse Rate: 109 (07/24 1000)  Labs:  Recent Labs  12/14/15 0120 12/14/15 0923 12/15/15 0030 12/16/15 0431  WBC 23.8*  --  22.5* 16.3*  HGB 8.7*  --  8.0* 7.2*  HCT 25.9*  --  24.1* 21.6*  PLT 162  --  180 203  INR  --  1.54*  --   --     Recent Labs  12/14/15 0450 12/15/15 0030 12/16/15 0431 12/16/15 0947  NA 151* 141 137 138  K 4.0 3.4* 3.5 3.5  CL 125* 116* 111 111  CO2 20* 19* 21* 19*  GLUCOSE 227* 285* 243* 220*  BUN 108* 115* 116* 112*  CREATININE 4.64* 4.94* 4.97* 5.01*  CALCIUM 8.2* 7.8* 7.6* 7.8*  MG 2.2  --  1.8  --   PHOS 3.8  --  3.9 3.5  PROT 5.2* 5.3* 5.3*  --   ALBUMIN 1.8* 1.7* 1.6* 1.7*  AST 205* 119* 107*  --   ALT 90* 99* 92*  --   ALKPHOS 69 69 74  --   BILITOT 0.7 0.6 0.5  --   BILIDIR  --  0.2  --   --   IBILI  --  0.4  --   --   PREALBUMIN 5.1*  --   --   --   TRIG 136  --  134  --    Estimated Creatinine Clearance: 16.5 mL/min (by C-G formula based on SCr of 5.01 mg/dL).    Recent Labs  12/15/15 2358 12/16/15 0432 12/16/15 0757  GLUCAP 273* 263* 228*   Medical History: Past Medical History:  Diagnosis Date  . Aortic root dilatation (HCC)   . Back pain   . CKD (chronic kidney disease) stage 4, GFR 15-29 ml/min (HCC)   . Coronary atherosclerosis of native coronary artery 2003   Multivessel s/p CABG  . Diabetes mellitus, type II, insulin dependent (HCC)   . GERD (gastroesophageal reflux disease)   . Hard of hearing   . History of tobacco abuse   . Hyperlipidemia   . Hypertension   . Morbid obesity (HCC)    Medications:  Prescriptions Prior to Admission  Medication Sig Dispense  Refill Last Dose  . aspirin EC 81 MG tablet Take 81 mg by mouth daily.   12/03/2015 at Unknown time  . atorvastatin (LIPITOR) 20 MG tablet TAKE 1 TABLET BY MOUTH AT BEDTIME FOR CHOLESTEROL. 30 tablet 0 12/03/2015 at Unknown time  . furosemide (LASIX) 20 MG tablet Take 2 tablets (40 mg total) by mouth daily. Restart on 3/14 30 tablet  12/03/2015 at Unknown time  . gabapentin (NEURONTIN) 100 MG capsule Take 100 mg by mouth 3 (three) times daily.   12/03/2015 at Unknown time  . insulin detemir (LEVEMIR) 100 UNIT/ML injection Inject 0.25 mLs (25 Units total) into the skin daily.   12/03/2015 at Unknown time  . naproxen (NAPROSYN) 500 MG tablet Take 500 mg by mouth 2 (two) times daily with a meal.    Past Week at Unknown time  . pantoprazole (PROTONIX) 40 MG tablet Take 1 tablet (40 mg total) by mouth 2 (two) times daily before a meal. 180 tablet 3  12/03/2015 at Unknown time  . pioglitazone (ACTOS) 45 MG tablet Take 45 mg by mouth daily.    12/03/2015 at Unknown time  . potassium chloride (MICRO-K) 10 MEQ CR capsule Take 1 capsule (10 mEq total) by mouth daily. Restart on 3/14   12/03/2015 at Unknown time  . SPIRIVA RESPIMAT 2.5 MCG/ACT AERS Inhale 1 puff into the lungs daily.    12/03/2015 at Unknown time  . [DISCONTINUED] HYDROcodone-acetaminophen (NORCO) 7.5-325 MG tablet Take 1 tablet by mouth every 4 (four) hours as needed for moderate pain (Must last 30 days.  Do not drive or operate machinery while taking this medicine.). 120 tablet 0 12/03/2015 at Unknown time  . albuterol (PROVENTIL HFA;VENTOLIN HFA) 108 (90 BASE) MCG/ACT inhaler Inhale 2 puffs into the lungs every 4 (four) hours as needed for wheezing or shortness of breath.   unknown  . albuterol (PROVENTIL) (2.5 MG/3ML) 0.083% nebulizer solution Take 3 mLs (2.5 mg total) by nebulization every 6 (six) hours as needed for wheezing or shortness of breath. 75 mL 12 unknown  . citalopram (CELEXA) 40 MG tablet Take 0.5 tablets (20 mg total) by mouth daily.  (Patient not taking: Reported on 2015-12-14) 30 tablet 1 Taking  . metoprolol tartrate (LOPRESSOR) 25 MG tablet Take 0.5 tablets (12.5 mg total) by mouth 2 (two) times daily. (Patient not taking: Reported on 14-Dec-2015) 30 tablet 1 Taking   Insulin Requirements in the past 24 hours:  Levemir 25 units on 7/23, Levemir 35 units on 7/24 at 1000 58 units of insulin per SSI scale,  30 units of insulin in TPN  Current Nutrition:  Full liquid diet, Prostat 30 ml bid ordered but not receiving due to swallowing issues, Clinimix 5/15 at 50 ml/hr (TPN rate increased 7/24 per nephrologist - Dr Kristian Covey)  Assessment: 82 yo man s/p repair of duodenal perforation with active GI bleed and symptomatic anemia to start TPN. Na and K+ now normal.  CBGs remain elevated despite increases in levemir and insulin in TPN. SrCr and BUN continue to rise.  Albumin and Prealbumin low.  Calcium corrects to normal due to low albumin.  Will d/c Levemir insulin and increase insulin in TPN to avoid possibility of hypoglycemia if TPN were stopped inadvertently due to pump error or some other reason (per previous discussions with diabetes educators).  Continue SSI with CBG checks q4hrs.   I/O + 259 past 24hrs   Nutritional Goals:  1300-1500 kCal,  147-172 grams of protein per day per RD  Plan:  D/C levemir insulin and increase insulin in TPN.   Increase regular insulin in TPN to 50 units per 24hrs & continue SSI, q4h checks Cont  prostat 30 ml bid when able to swallow TPN rate increased by MD (nephrology) to 46ml/hr on 7/24  Establish glucose control before any additional rate increases Cont to hold lipids Monitor lytes, renal fxn, fluid status, glucose tolerance  Valrie Hart, PharmD Clinical Pharmacist Pager:  [phone]

## 2015-12-16 NOTE — Progress Notes (Signed)
Subjective: Interval History: . Patient offers no complaints. He denies any difficulty breathing.  Objective: Vital signs in last 24 hours: Temp:  [98.5 F (36.9 C)-99 F (37.2 C)] 98.7 F (37.1 C) (07/24 0400) Pulse Rate:  [47-112] 47 (07/24 0800) Resp:  [20-37] 24 (07/24 0800) BP: (88-143)/(53-76) 114/54 (07/24 0800) SpO2:  [95 %-100 %] 100 % (07/24 0800) Weight:  [124 kg (273 lb 5.9 oz)] 124 kg (273 lb 5.9 oz) (07/24 0500) Weight change: 2.4 kg (5 lb 4.7 oz)  Intake/Output from previous day: 07/23 0701 - 07/24 0700 In: 3877.5 [P.O.:980; I.V.:2835; IV Piggyback:62.5] Out: 3618 [Urine:3600; Drains:15; Stool:3] Intake/Output this shift: Total I/O In: -  Out: 570 [Urine:450; Drains:120]  Generally patient is more alert today. He is surrounded by his families. According to them he wanted to go home. Chest is clear to auscultation Heart exam revealed regular rate and rhythm Abdomen hypoactive Extremities trace edema  Lab Results:  Recent Labs  12/15/15 0030 12/16/15 0431  WBC 22.5* 16.3*  HGB 8.0* 7.2*  HCT 24.1* 21.6*  PLT 180 203   BMET:   Recent Labs  12/15/15 0030 12/16/15 0431  NA 141 137  K 3.4* 3.5  CL 116* 111  CO2 19* 21*  GLUCOSE 285* 243*  BUN 115* 116*  CREATININE 4.94* 4.97*  CALCIUM 7.8* 7.6*   No results for input(s): PTH in the last 72 hours. Iron Studies: No results for input(s): IRON, TIBC, TRANSFERRIN, FERRITIN in the last 72 hours.  Studies/Results: No results found.  I have reviewed the patient's current medications.  Assessment/Plan: Problem #1 acute kidney injury: Possibly prerenal versus ATN. No significant change with his renal function. His urine output seems to be getting better. He had 3600 mL of urine output the last 24 hours. Problem #2 hypokalemia: Potassium remains normal. Problem #3 hypernatremia: His sodium has normalized. Problem #4 history of anemia: His hemoglobin has declined today. Problem #5 history of  diabetes: His blood sugar is high this morning Problem #6 history of perforated duodenal ulcer status post surgery Problem #7 hypotension. his blood pressure is normal. Plan: 1] have discussed in detail with his wife and other family members who were with him. Presently his renal function is worsening and patient also with multiple medical problems. According to his wife patient has told her he does not want dialysis. Hence at this moment will try to manage him conservatively. 2] we'll change his Lasix to 20 mg IV twice a day 3] we'll change his IV fluid to D5 water at 75 mL per hour 4] we'll check his renal panel and CBC in the morning. 5] we'll increase TPN to 50 mL per hour   LOS: 12 days   Roxas Clymer S 12/16/2015,9:51 AM

## 2015-12-17 ENCOUNTER — Encounter (HOSPITAL_COMMUNITY): Payer: Self-pay | Admitting: Primary Care

## 2015-12-17 DIAGNOSIS — Z7189 Other specified counseling: Secondary | ICD-10-CM

## 2015-12-17 DIAGNOSIS — Z515 Encounter for palliative care: Secondary | ICD-10-CM

## 2015-12-17 DIAGNOSIS — K631 Perforation of intestine (nontraumatic): Secondary | ICD-10-CM

## 2015-12-17 LAB — CBC
HCT: 27.5 % — ABNORMAL LOW (ref 39.0–52.0)
HEMATOCRIT: 18.7 % — AB (ref 39.0–52.0)
HEMOGLOBIN: 6.3 g/dL — AB (ref 13.0–17.0)
Hemoglobin: 8.9 g/dL — ABNORMAL LOW (ref 13.0–17.0)
MCH: 29.2 pg (ref 26.0–34.0)
MCH: 30.4 pg (ref 26.0–34.0)
MCHC: 32.4 g/dL (ref 30.0–36.0)
MCHC: 33.7 g/dL (ref 30.0–36.0)
MCV: 90.2 fL (ref 78.0–100.0)
MCV: 90.3 fL (ref 78.0–100.0)
PLATELETS: 201 10*3/uL (ref 150–400)
Platelets: 262 10*3/uL (ref 150–400)
RBC: 2.07 MIL/uL — ABNORMAL LOW (ref 4.22–5.81)
RBC: 3.05 MIL/uL — AB (ref 4.22–5.81)
RDW: 15.3 % (ref 11.5–15.5)
RDW: 14.4 % (ref 11.5–15.5)
WBC: 15.9 10*3/uL — ABNORMAL HIGH (ref 4.0–10.5)
WBC: 20.6 10*3/uL — ABNORMAL HIGH (ref 4.0–10.5)

## 2015-12-17 LAB — GLUCOSE, CAPILLARY
GLUCOSE-CAPILLARY: 260 mg/dL — AB (ref 65–99)
Glucose-Capillary: 199 mg/dL — ABNORMAL HIGH (ref 65–99)
Glucose-Capillary: 310 mg/dL — ABNORMAL HIGH (ref 65–99)
Glucose-Capillary: 324 mg/dL — ABNORMAL HIGH (ref 65–99)

## 2015-12-17 LAB — COMPREHENSIVE METABOLIC PANEL
ALK PHOS: 79 U/L (ref 38–126)
ALT: 82 U/L — AB (ref 17–63)
ANION GAP: 10 (ref 5–15)
AST: 110 U/L — ABNORMAL HIGH (ref 15–41)
Albumin: 1.6 g/dL — ABNORMAL LOW (ref 3.5–5.0)
BUN: 128 mg/dL — ABNORMAL HIGH (ref 6–20)
CALCIUM: 7.5 mg/dL — AB (ref 8.9–10.3)
CHLORIDE: 107 mmol/L (ref 101–111)
CO2: 19 mmol/L — AB (ref 22–32)
CREATININE: 5.92 mg/dL — AB (ref 0.61–1.24)
GFR, EST AFRICAN AMERICAN: 9 mL/min — AB (ref >60)
GFR, EST NON AFRICAN AMERICAN: 8 mL/min — AB (ref >60)
Glucose, Bld: 206 mg/dL — ABNORMAL HIGH (ref 65–99)
Potassium: 3.6 mmol/L (ref 3.5–5.1)
SODIUM: 136 mmol/L (ref 135–145)
Total Bilirubin: 0.5 mg/dL (ref 0.3–1.2)
Total Protein: 5.4 g/dL — ABNORMAL LOW (ref 6.5–8.1)

## 2015-12-17 LAB — PREPARE RBC (CROSSMATCH)

## 2015-12-17 LAB — PHOSPHORUS: Phosphorus: 4.6 mg/dL (ref 2.5–4.6)

## 2015-12-17 MED ORDER — SODIUM CHLORIDE 0.9 % IV SOLN: Freq: Once | INTRAVENOUS | Status: DC

## 2015-12-17 MED ORDER — TRACE MINERALS CR-CU-MN-SE-ZN 10-1000-500-60 MCG/ML IV SOLN
INTRAVENOUS | Status: DC
  Filled 2015-12-17: qty 1200

## 2015-12-17 MED ORDER — SODIUM CHLORIDE 0.9 % IV SOLN
Freq: Once | INTRAVENOUS | Status: AC
  Administered 2015-12-17: 08:00:00 via INTRAVENOUS

## 2015-12-17 MED ORDER — MORPHINE SULFATE (CONCENTRATE) 10 MG/0.5ML PO SOLN
5.0000 mg | ORAL | Status: DC | PRN
  Administered 2015-12-18: 5 mg via SUBLINGUAL
  Filled 2015-12-17: qty 0.5

## 2015-12-17 MED ORDER — MORPHINE SULFATE (CONCENTRATE) 10 MG/0.5ML PO SOLN
5.0000 mg | ORAL | Status: DC
  Administered 2015-12-17 – 2015-12-18 (×6): 5 mg via SUBLINGUAL
  Filled 2015-12-17 (×5): qty 0.5

## 2015-12-17 MED ORDER — PIPERACILLIN SOD-TAZOBACTAM SO 2.25 (2-0.25) G IV SOLR
2.2500 g | Freq: Three times a day (TID) | INTRAVENOUS | Status: DC
  Filled 2015-12-17 (×4): qty 2.25

## 2015-12-17 NOTE — Progress Notes (Signed)
Subjective: Interval History: . Patient offers no complaints. The patient remains somnolent but arousable.   Objective: Vital signs in last 24 hours: Temp:  [98 F (36.7 C)-99.9 F (37.7 C)] 98.3 F (36.8 C) (07/25 0615) Pulse Rate:  [47-151] 112 (07/25 0615) Resp:  [23-42] 37 (07/25 0615) BP: (88-148)/(53-87) 123/82 (07/25 0615) SpO2:  [95 %-100 %] 96 % (07/25 0615) Weight:  [124.8 kg (275 lb 2.2 oz)] 124.8 kg (275 lb 2.2 oz) (07/25 0500) Weight change: 0.8 kg (1 lb 12.2 oz)  Intake/Output from previous day: 07/24 0701 - 07/25 0700 In: 3155 [P.O.:720; I.V.:1900; Blood:335; IV Piggyback:200] Out: 2095 [Urine:1725; Drains:360; Stool:10] Intake/Output this shift: No intake/output data recorded.  Generally somnolent but arousable. Patient seems to vomit coffee-ground material. Chest : Bilateral expiratory wheezing Heart exam revealed regular rate and rhythm Abdomen : Distended, nontender Extremities trace edema  Lab Results:  Recent Labs  12/16/15 0431 12/16/15 2358  WBC 16.3* 20.6*  HGB 7.2* 6.3*  HCT 21.6* 18.7*  PLT 203 262   BMET:   Recent Labs  12/16/15 0431 12/16/15 0947  NA 137 138  K 3.5 3.5  CL 111 111  CO2 21* 19*  GLUCOSE 243* 220*  BUN 116* 112*  CREATININE 4.97* 5.01*  CALCIUM 7.6* 7.8*   No results for input(s): PTH in the last 72 hours. Iron Studies: No results for input(s): IRON, TIBC, TRANSFERRIN, FERRITIN in the last 72 hours.  Studies/Results: No results found.  I have reviewed the patient's current medications.  Assessment/Plan: Problem #1 acute kidney injury: Possibly prerenal versus ATN. Patient remains nonoliguric however his renal function continued to decline. Problem #2 hypokalemia: Potassium remains normal. Problem #3 hypernatremia: His sodium has normalized. Problem #4 history of anemia: His hemoglobin continued to decline. Presently patient seems to have possible upper GI bleeding. Problem #5 history of diabetes: His blood  sugar is high this morning Problem #6 history of perforated duodenal ulcer status post surgery Problem #7 hypotension. his blood pressure is normal. Plan: 1] patient overall seems to be with poor prognosis. At this moment patient may benefit from hospice. Family is don't consider dialysis which seems to be a reasonable decision. 2] we'll continue his present management. 3] we'll check renal panel in the morning   LOS: 13 days   Merick Kelleher S 12/17/2015,7:15 AM

## 2015-12-17 NOTE — Progress Notes (Signed)
PARENTERAL NUTRITION CONSULT NOTE  Pharmacy Consult for TPN Indication: intolerance to enteral feeding   No Known Allergies  Patient Measurements: Height:  (188 cm) Weight: 275 lb 2.2 oz (124.8 kg) IBW/kg (Calculated) : 82.2  Vital Signs: Temp: 98.2 F (36.8 C) (07/25 1130) Temp Source: Axillary (07/25 1130) BP: 98/66 (07/25 1300) Pulse Rate: 99 (07/25 1300)  Labs:  Recent Labs  12/16/15 0431 12/16/15 2358 12/17/15 1027  WBC 16.3* 20.6* 15.9*  HGB 7.2* 6.3* 8.9*  HCT 21.6* 18.7* 27.5*  PLT 203 262 201    Recent Labs  12/15/15 0030 12/16/15 0431 12/16/15 0947 12/17/15 1027  NA 141 137 138 136  K 3.4* 3.5 3.5 3.6  CL 116* 111 111 107  CO2 19* 21* 19* 19*  GLUCOSE 285* 243* 220* 206*  BUN 115* 116* 112* 128*  CREATININE 4.94* 4.97* 5.01* 5.92*  CALCIUM 7.8* 7.6* 7.8* 7.5*  MG  --  1.8  --   --   PHOS  --  3.9 3.5 4.6  PROT 5.3* 5.3*  --  5.4*  ALBUMIN 1.7* 1.6* 1.7* 1.6*  AST 119* 107*  --  110*  ALT 99* 92*  --  82*  ALKPHOS 69 74  --  79  BILITOT 0.6 0.5  --  0.5  BILIDIR 0.2  --   --   --   IBILI 0.4  --   --   --   PREALBUMIN  --  4.8*  --   --   TRIG  --  134  --   --    Estimated Creatinine Clearance: 14 mL/min (by C-G formula based on SCr of 5.92 mg/dL).   Recent Labs  12/17/15 0430 12/17/15 0729 12/17/15 1145  GLUCAP 310* 260* 199*   Medical History: Past Medical History:  Diagnosis Date  . Aortic root dilatation (HCC)   . Back pain   . CKD (chronic kidney disease) stage 4, GFR 15-29 ml/min (HCC)   . Coronary atherosclerosis of native coronary artery 2003   Multivessel s/p CABG  . Diabetes mellitus, type II, insulin dependent (HCC)   . GERD (gastroesophageal reflux disease)   . Hard of hearing   . History of tobacco abuse   . Hyperlipidemia   . Hypertension   . Morbid obesity (HCC)    Medications:  Prescriptions Prior to Admission  Medication Sig Dispense Refill Last Dose  . aspirin EC 81 MG tablet Take 81 mg by mouth  daily.   12/03/2015 at Unknown time  . atorvastatin (LIPITOR) 20 MG tablet TAKE 1 TABLET BY MOUTH AT BEDTIME FOR CHOLESTEROL. 30 tablet 0 12/03/2015 at Unknown time  . furosemide (LASIX) 20 MG tablet Take 2 tablets (40 mg total) by mouth daily. Restart on 3/14 30 tablet  12/03/2015 at Unknown time  . gabapentin (NEURONTIN) 100 MG capsule Take 100 mg by mouth 3 (three) times daily.   12/03/2015 at Unknown time  . insulin detemir (LEVEMIR) 100 UNIT/ML injection Inject 0.25 mLs (25 Units total) into the skin daily.   12/03/2015 at Unknown time  . naproxen (NAPROSYN) 500 MG tablet Take 500 mg by mouth 2 (two) times daily with a meal.    Past Week at Unknown time  . pantoprazole (PROTONIX) 40 MG tablet Take 1 tablet (40 mg total) by mouth 2 (two) times daily before a meal. 180 tablet 3 12/03/2015 at Unknown time  . pioglitazone (ACTOS) 45 MG tablet Take 45 mg by mouth daily.    12/03/2015 at Unknown  time  . potassium chloride (MICRO-K) 10 MEQ CR capsule Take 1 capsule (10 mEq total) by mouth daily. Restart on 3/14   12/03/2015 at Unknown time  . SPIRIVA RESPIMAT 2.5 MCG/ACT AERS Inhale 1 puff into the lungs daily.    12/03/2015 at Unknown time  . [DISCONTINUED] HYDROcodone-acetaminophen (NORCO) 7.5-325 MG tablet Take 1 tablet by mouth every 4 (four) hours as needed for moderate pain (Must last 30 days.  Do not drive or operate machinery while taking this medicine.). 120 tablet 0 12/03/2015 at Unknown time  . albuterol (PROVENTIL HFA;VENTOLIN HFA) 108 (90 BASE) MCG/ACT inhaler Inhale 2 puffs into the lungs every 4 (four) hours as needed for wheezing or shortness of breath.   unknown  . albuterol (PROVENTIL) (2.5 MG/3ML) 0.083% nebulizer solution Take 3 mLs (2.5 mg total) by nebulization every 6 (six) hours as needed for wheezing or shortness of breath. 75 mL 12 unknown  . citalopram (CELEXA) 40 MG tablet Take 0.5 tablets (20 mg total) by mouth daily. (Patient not taking: Reported on 12/14/2015) 30 tablet 1 Taking  .  metoprolol tartrate (LOPRESSOR) 25 MG tablet Take 0.5 tablets (12.5 mg total) by mouth 2 (two) times daily. (Patient not taking: Reported on 12/06/2015) 30 tablet 1 Taking   Insulin Requirements in the past 24 hours:  Levemir 35 units on 7/24 at 1000 81 units of insulin per SSI scale,  50 units of insulin in TPN  Current Nutrition:  Full liquid diet, Prostat 30 ml bid ordered but not receiving due to swallowing issues, Clinimix 5/15 at 50 ml/hr (TPN rate increased 7/24 per nephrologist - Dr Kristian Covey)  Assessment: 82 yo man s/p repair of duodenal perforation with active GI bleed and symptomatic anemia to start TPN. Na and K+ now normal.  CBGs remain elevated despite increases in levemir and insulin in TPN. SrCr and BUN continue to rise.  Albumin and Prealbumin low.  Calcium corrects to normal due to low albumin.  Will d/c Levemir insulin and increase insulin in TPN to avoid possibility of hypoglycemia if TPN were stopped inadvertently due to pump error or some other reason (per previous discussions with diabetes educators).  Continue SSI with CBG checks q4hrs.   I/O + 1060 past 24hrs   Nutritional Goals:  1300-1500 kCal,  147-172 grams of protein per day per RD  Plan:  Increase regular insulin in TPN to 80 units per 24hrs & continue SSI, q4h checks Cont  prostat 30 ml bid when able to swallow TPN rate increased by MD (nephrology) to 80ml/hr on 7/24  Establish glucose control before any additional rate increases Cont to hold lipids Monitor lytes, renal fxn, fluid status, glucose tolerance  Valrie Hart, PharmD Clinical Pharmacist Pager:  [phone]

## 2015-12-17 NOTE — Progress Notes (Signed)
Inpatient Diabetes Program Recommendations  AACE/ADA: New Consensus Statement on Inpatient Glycemic Control (2015)  Target Ranges:  Prepandial:   less than 140 mg/dL      Peak postprandial:   less than 180 mg/dL (1-2 hours)      Critically ill patients:  140 - 180 mg/dL   Results for [name], [name] (MRN [id number]) as of 12/17/2015 10:14  Ref. Range 12/16/2015 04:32 12/16/2015 07:57 12/16/2015 11:23 12/16/2015 16:34 12/16/2015 19:59 12/17/2015 00:16 12/17/2015 04:30 12/17/2015 07:29  Glucose-Capillary Latest Ref Range: 65 - 99 mg/dL 956 (H) 387 (H) 564 (H) 251 (H) 307 (H) 324 (H) 310 (H) 260 (H)   Review of Glycemic Control   Current orders for Inpatient glycemic control: Novolog 0-20 units Q4H, TPN with 50 units of insulin added  Inpatient Diabetes Program Recommendations:  Insulin: Noted patient received Levemir 35 units on 12/16/15 and Levemir order was discontinued as insulin in TPN was increased yesterday from 30 units to 50 units. Since glucose has remained elevated (ranged from 260-324 mgd/dl over the past 8 hours) despite increase in insulin in TPN, may want to consider increasing insulin in TPN to 70 units. May also want to restart Levemir at 25 units daily (this was patient's outpatient home dose) if appropriate for goals of care.  Thanks, Orlando Penner, RN, MSN, CDE Diabetes Coordinator Inpatient Diabetes Program [phone] (Team Pager from 8am to 5pm) [phone] ([facility] office) [phone] [facility] office) [phone] [facility] office)

## 2015-12-17 NOTE — Consult Note (Signed)
Consultation Note Date: 12/17/2015   Patient Name: [name]  DOB: [birth year]  MRN: [id number]  Age / Sex: 82 y.o., male  PCP: Joycelyn Rua, MD Referring Physician: Franky Macho, MD  Reason for Consultation: Disposition, Establishing goals of care, Inpatient hospice referral, Pain control, Psychosocial/spiritual support and Withdrawal of life-sustaining treatment  HPI/Patient Profile: 82 y.o. male  with past medical history of HTN, GERD, DM, CAD, CKD, and GI Bleed admitted on 12/15/2015 with Perforated duodenal ulcer.    Clinical Assessment and Goals of Care:  Mr. Hoglund is resting quietly in bed, with family at bedside. Wife Mae, daughter Babette Relic, nephews Fayrene Fearing and [place].  We talk about his chronic health issues and his current acute illness. We talk about his low hemoglobin, and getting blood earlier today. Wife, Harvie Bridge states that he has a history of ulcers and she feels that maybe he has had another ulcer start to bleed. Wife states that if he were able to make it to January 6 they would've been married 60 years. She shares that they had 8 children and raised 9.    Mae shares that Mr. Holland has told the family that, "he won't be here much longer". She states that he told them that he had already talked to Jesus, that He appeared to Mr. Recktenwald.  Family states multiple times that they feel that he is suffering, and that the goal for him is comfort care. We talk about untethering, untying him from TPN, IV fluids, antibiotics, and blood products. Family is in agreement stating that they do not wish to prolonged Mr. Hanline's suffering. We talk about transferring him to another room here in the hospital. Wife states that he does not want to go to a facility. We talk about the possibility of transferred to the hospice home of [facility], but that I believe Mr. Gengler has hours to days, and will likely  not be stable enough for transfer. Anticipated Hospital death, family states they feel the same.  I talk with son Chanetta Marshall via telephone. We talk about the severity of his father's illness, and that all the interventions are not changing what is happening. Chanetta Marshall states that the siblings are in agreement, that their father has suffering day after day, and that they are ready to focus on comfort only.  Call to Dr. Lovell Sheehan for update on family wishes, he is in agreement with comfort measures only at this time.  Comfort measure orders implemented.  Health care Power of atty NEXT OF KIN - wife Mae   SUMMARY OF RECOMMENDATIONS   Family requests full comfort care, family states they believe that he will pass in the hospital.  We discuss going to [facility].   Code Status/Advance Care Planning:  DNR - allow a natural death.   Symptom Management:   Per surgery  Palliative Prophylaxis:   Aspiration, Frequent Pain Assessment, Palliative Wound Care and Turn Reposition  Additional Recommendations (Limitations, Scope, Preferences):  Full Comfort Care  Psycho-social/Spiritual:   Desire  for further Chaplaincy support:yes  Additional Recommendations: Caregiving  Support/Resources and Education on Hospice  Prognosis:   Hours - Days, based on family's desire for comfort measures only.    Discharge Planning: Anticipated Hospital Death      Primary Diagnoses: Present on Admission: . Perforated duodenal ulcer (HCC)   I have reviewed the medical record, interviewed the patient and family, and examined the patient. The following aspects are pertinent.  Past Medical History:  Diagnosis Date  . Aortic root dilatation (HCC)   . Back pain   . CKD (chronic kidney disease) stage 4, GFR 15-29 ml/min (HCC)   . Coronary atherosclerosis of native coronary artery 2003   Multivessel s/p CABG  . Diabetes mellitus, type II, insulin dependent (HCC)   . GERD (gastroesophageal  reflux disease)   . Hard of hearing   . History of tobacco abuse   . Hyperlipidemia   . Hypertension   . Morbid obesity (HCC)    Social History   Social History  . Marital status: Married    Spouse name: N/A  . Number of children: 5  . Years of education: 3   Occupational History  . Retired from [employer] works Retired   Social History Main Topics  . Smoking status: Former Smoker    Packs/day: 1.00    Years: 60.00    Types: Cigarettes    Quit date: 05/25/2000  . Smokeless tobacco: Never Used  . Alcohol use No  . Drug use: No  . Sexual activity: Not Asked   Other Topics Concern  . None   Social History Narrative   Lives with wife of 52 years   Enjoys watching his great-grandson   Family History  Problem Relation Age of Onset  . Heart disease Father   . Heart attack Child    Scheduled Meds: . sodium chloride   Intravenous Once  . sodium chloride   Intravenous Once  . antiseptic oral rinse  7 mL Mouth Rinse q12n4p  . chlorhexidine  15 mL Mouth Rinse BID  . feeding supplement (PRO-STAT SUGAR FREE 64)  30 mL Oral BID  . furosemide  20 mg Intravenous BID  . insulin aspart  0-20 Units Subcutaneous Q4H  . pantoprazole (PROTONIX) IV  40 mg Intravenous Q12H  . piperacillin-tazobactam (ZOSYN) IVPB  2.25 g Intravenous Q8H  . sucralfate  1 g Oral TID WC & HS   Continuous Infusions: . Marland KitchenTPN (CLINIMIX-E) Adult 50 mL/hr at 12/16/15 1800  . Marland KitchenTPN (CLINIMIX-E) Adult    . sodium chloride 0.45 % with kcl 75 mL/hr at 12/17/15 1300   PRN Meds:.acetaminophen **OR** acetaminophen, albuterol, HYDROmorphone (DILAUDID) injection, LORazepam, ondansetron **OR** ondansetron (ZOFRAN) IV Medications Prior to Admission:  Prior to Admission medications   Medication Sig Start Date End Date Taking? Authorizing Provider  aspirin EC 81 MG tablet Take 81 mg by mouth daily.   Yes Historical Provider, MD  atorvastatin (LIPITOR) 20 MG tablet TAKE 1 TABLET BY MOUTH AT BEDTIME FOR CHOLESTEROL. 02/15/14   Yes Rollene Rotunda, MD  furosemide (LASIX) 20 MG tablet Take 2 tablets (40 mg total) by mouth daily. Restart on 3/14 08/01/15  Yes Erick Blinks, MD  gabapentin (NEURONTIN) 100 MG capsule Take 100 mg by mouth 3 (three) times daily.   Yes Historical Provider, MD  insulin detemir (LEVEMIR) 100 UNIT/ML injection Inject 0.25 mLs (25 Units total) into the skin daily. 07/30/13  Yes Standley Brooking, MD  naproxen (NAPROSYN) 500 MG tablet Take 500 mg by mouth  2 (two) times daily with a meal.  07/16/15  Yes Historical Provider, MD  pantoprazole (PROTONIX) 40 MG tablet Take 1 tablet (40 mg total) by mouth 2 (two) times daily before a meal. 08/12/15  Yes Nira Retort, NP  pioglitazone (ACTOS) 45 MG tablet Take 45 mg by mouth daily.  05/22/15  Yes Historical Provider, MD  potassium chloride (MICRO-K) 10 MEQ CR capsule Take 1 capsule (10 mEq total) by mouth daily. Restart on 3/14 08/01/15  Yes Erick Blinks, MD  SPIRIVA RESPIMAT 2.5 MCG/ACT AERS Inhale 1 puff into the lungs daily.  03/07/14  Yes Historical Provider, MD  albuterol (PROVENTIL HFA;VENTOLIN HFA) 108 (90 BASE) MCG/ACT inhaler Inhale 2 puffs into the lungs every 4 (four) hours as needed for wheezing or shortness of breath.    Historical Provider, MD  albuterol (PROVENTIL) (2.5 MG/3ML) 0.083% nebulizer solution Take 3 mLs (2.5 mg total) by nebulization every 6 (six) hours as needed for wheezing or shortness of breath. 08/01/15   Erick Blinks, MD  citalopram (CELEXA) 40 MG tablet Take 0.5 tablets (20 mg total) by mouth daily. Patient not taking: Reported on Jan 02, 2016 06/10/15   Hollice Espy, MD  HYDROcodone-acetaminophen (NORCO) 7.5-325 MG tablet Take 1-2 tablets by mouth every 4 (four) hours as needed for moderate pain (Must last 30 days.  Do not drive or operate machinery while taking this medicine.). 12/07/15   Franky Macho, MD  metoprolol tartrate (LOPRESSOR) 25 MG tablet Take 0.5 tablets (12.5 mg total) by mouth 2 (two) times daily. Patient not taking:  Reported on 01/02/16 08/01/15   Erick Blinks, MD   No Known Allergies Review of Systems  Unable to perform ROS: Acuity of condition    Physical Exam  Constitutional: No distress.  Chronically ill appearing  HENT:  Head: Normocephalic and atraumatic.  Cardiovascular: Normal rate and regular rhythm.   Rate 100's at times.   Pulmonary/Chest: Effort normal. No respiratory distress.  Abdominal: Soft. He exhibits no distension.  Neurological:  Lethargic, able to communicate only basic needs.   Skin: Skin is warm and dry. There is pallor.    Vital Signs: BP 116/66   Pulse 99   Temp 98.2 F (36.8 C) (Axillary)   Resp (!) 27   Ht 6\' 2"  (1.88 m)   Wt 124.8 kg (275 lb 2.2 oz)   SpO2 98%   BMI 35.33 kg/m  Pain Assessment: CPOT POSS *See Group Information*: 2-Acceptable,Slightly drowsy, easily aroused Pain Score: Asleep   SpO2: SpO2: 98 % O2 Device:SpO2: 98 % O2 Flow Rate: .O2 Flow Rate (L/min): 4 L/min  IO: Intake/output summary:  Intake/Output Summary (Last 24 hours) at 12/17/15 1411 Last data filed at 12/17/15 1300  Gross per 24 hour  Intake          3398.33 ml  Output              810 ml  Net          2588.33 ml    LBM: Last BM Date: 12/17/15 Baseline Weight: Weight: 127 kg (280 lb) Most recent weight: Weight: 124.8 kg (275 lb 2.2 oz)     Palliative Assessment/Data:   Flowsheet Rows   Flowsheet Row Most Recent Value  Intake Tab  Referral Department  Surgery  Unit at Time of Referral  ICU  Palliative Care Primary Diagnosis  Other (Comment) [GI Bleed]  Date Notified  12/17/15  Palliative Care Type  New Palliative care  Reason for referral  Clarify Goals of  Care, End of Life Care Assistance, Psychosocial or Spiritual support  Date of Admission  12/24/2015  Date first seen by Palliative Care  12/17/15  # of days Palliative referral response time  0 Day(s)  # of days IP prior to Palliative referral  13  Clinical Assessment  Palliative Performance Scale Score  20%   Pain Max last 24 hours  Not able to report  Pain Min Last 24 hours  Not able to report  Dyspnea Max Last 24 Hours  Not able to report  Dyspnea Min Last 24 hours  Not able to report  Psychosocial & Spiritual Assessment  Palliative Care Outcomes  Patient/Family meeting held?  Yes  Who was at the meeting?  wife Harvie Bridge, dau Tammy, nephews Fayrene Fearing and [place]  Palliative Care Outcomes  Provided end of life care assistance, Provided advance care planning, Changed to focus on comfort, Provided psychosocial or spiritual support, Transitioned to hospice  Patient/Family wishes: Interventions discontinued/not started   Mechanical Ventilation, Hemodialysis  Palliative Care follow-up planned  -- [follow up while at [facility]]      Time In: 1310 Time Out: 1420 Time Total: 70 minutes Greater than 50%  of this time was spent counseling and coordinating care related to the above assessment and plan.  Signed by: Katheran Awe, NP   Please contact Palliative Medicine Team phone at [phone] for questions and concerns.  For individual provider: See Loretha Stapler

## 2015-12-17 NOTE — Progress Notes (Signed)
13 Days Post-Op  Subjective: Patient lethargic and difficult to arouse.  Objective: Vital signs in last 24 hours: Temp:  [98 F (36.7 C)-99.9 F (37.7 C)] 98.3 F (36.8 C) (07/25 0615) Pulse Rate:  [47-151] 107 (07/25 0700) Resp:  [23-42] 28 (07/25 0700) BP: (88-148)/(53-87) 126/72 (07/25 0700) SpO2:  [95 %-100 %] 97 % (07/25 0700) Weight:  [124.8 kg (275 lb 2.2 oz)] 124.8 kg (275 lb 2.2 oz) (07/25 0500) Last BM Date: 12/17/15  Intake/Output from previous day: 07/24 0701 - 07/25 0700 In: 3155 [P.O.:720; I.V.:1900; Blood:335; IV Piggyback:200] Out: 2095 [Urine:1725; Drains:360; Stool:10] Intake/Output this shift: No intake/output data recorded.  General appearance: no distress and slowed mentation Resp: Bilateral rales noted at end of expiration. Cardio: regular rate and rhythm, S1, S2 normal, no murmur, click, rub or gallop GI: Soft, distended. JP drainage sanguinous in nature. Incision healing well. No rigidity noted. Bowel sounds appreciated.  Lab Results:   Recent Labs  12/16/15 0431 12/16/15 2358  WBC 16.3* 20.6*  HGB 7.2* 6.3*  HCT 21.6* 18.7*  PLT 203 262   BMET  Recent Labs  12/16/15 0431 12/16/15 0947  NA 137 138  K 3.5 3.5  CL 111 111  CO2 21* 19*  GLUCOSE 243* 220*  BUN 116* 112*  CREATININE 4.97* 5.01*  CALCIUM 7.6* 7.8*   PT/INR  Recent Labs  12/14/15 0923  LABPROT 18.6*  INR 1.54*    Studies/Results: No results found.  Anti-infectives: Anti-infectives    Start     Dose/Rate Route Frequency Ordered Stop   12/16/15 1400  piperacillin-tazobactam (ZOSYN) IVPB 2.25 g     2.25 g 100 mL/hr over 30 Minutes Intravenous Every 8 hours 12/16/15 1136     12/16/15 1400  piperacillin-tazobactam (ZOSYN) 2.25 g in dextrose 5 % 50 mL IVPB     2.25 g 100 mL/hr over 30 Minutes Intravenous Every 6 hours 12/16/15 1142     12/08/15 1400  piperacillin-tazobactam (ZOSYN) IVPB 3.375 g  Status:  Discontinued     3.375 g 12.5 mL/hr over 240 Minutes  Intravenous Every 8 hours 12/08/15 0901 12/16/15 1135   12/05/15 1400  piperacillin-tazobactam (ZOSYN) 2.25 g in dextrose 5 % 50 mL IVPB  Status:  Discontinued     2.25 g 100 mL/hr over 30 Minutes Intravenous Every 8 hours 12/05/15 0810 12/08/15 0900   12/05/15 1000  piperacillin-tazobactam (ZOSYN) IVPB 2.25 g  Status:  Discontinued     2.25 g 100 mL/hr over 30 Minutes Intravenous Every 8 hours 12/05/15 0808 12/05/15 0810   12-09-15 1800  piperacillin-tazobactam (ZOSYN) IVPB 3.375 g  Status:  Discontinued     3.375 g 12.5 mL/hr over 240 Minutes Intravenous Every 8 hours 12/09/15 1707 12/05/15 0808   09-Dec-2015 1245  Ampicillin-Sulbactam (UNASYN) 3 g in sodium chloride 0.9 % 100 mL IVPB     3 g 100 mL/hr over 60 Minutes Intravenous  Once 09-Dec-2015 1236 12/09/2015 1329      Assessment/Plan: s/p Procedure(s): EXPLORATORY LAPAROTOMY GASTRORRHAPHY Impression: Patient's condition continues to worsen. Hemoglobin continues to slowly drop. Will give 2 units of packed red blood cells. Renal function serially being. Evidence of liver insufficiency noted. Patient with severe protein malnutrition. He is developing anasarca.  Plan: We'll give 2 units of packed red blood cells today. Patient's prognosis is poor. He had already refused dialysis in the past and is currently DO NOT RESUSCITATE. If we do not have significant improvement over the next 24-48 hours, will discuss with family about  comfort measures only.  LOS: 13 days    Dantre Yearwood A 12/17/2015

## 2015-12-21 LAB — TYPE AND SCREEN
ABO/RH(D): O POS
Antibody Screen: NEGATIVE
UNIT DIVISION: 0
UNIT DIVISION: 0
UNIT DIVISION: 0
Unit division: 0
Unit division: 0

## 2015-12-24 NOTE — Care Management Important Message (Signed)
Important Message  Patient Details  Name: [name] MRN: [id number] Date of Birth: 09/17/1933   Medicare Important Message Given:  Yes    Keaten Mashek, Chrystine Oiler, RN 11/29/2015, 9:45 AM

## 2015-12-24 NOTE — Progress Notes (Signed)
Patient has asked nursing staff to come to the patients room, due to patient not breathing. Upon arrival patient is apenic and has no heart rate upon auscultation. 2nd nurse Verification with C.McGibbony, RN. Dr. Lovell Sheehan paged to make aware.

## 2015-12-24 NOTE — Progress Notes (Signed)
[name]  MRN: [id number]  DOB/AGE: 1933/09/07 82 y.o.  Primary Care Physician:MEYERS, Jeannett Senior, MD  Admit date: 12-26-15  Chief Complaint:  Chief Complaint  Patient presents with  . Abdominal Pain    Orr-Pt presented on  2015/12/26 with  Chief Complaint  Patient presents with  . Abdominal Pain  .    Pt remains confused.Pt does not offer any complaints.    Pt is saying " I want to see the lord" . Pt had many family members in the room offering him support.   Meds . antiseptic oral rinse  7 mL Mouth Rinse q12n4p  . morphine CONCENTRATE  5 mg Sublingual Q4H     Physical Exam: Vital signs in last 24 hours: Temp:  [97.9 F (36.6 C)-99 F (37.2 C)] 98.8 F (37.1 C) (07/25 2045) Pulse Rate:  [47-151] 112 (07/25 2045) Resp:  [14-38] 30 (07/25 2045) BP: (98-155)/(53-97) 155/82 (07/25 2045) SpO2:  [94 %-100 %] 98 % (07/25 2045) Weight:  [275 lb 2.2 oz (124.8 kg)] 275 lb 2.2 oz (124.8 kg) (07/25 0500) Weight change:  Last BM Date: 12/17/15  Intake/Output from previous day: 07/25 0701 - 07/26 0700 In: 1398.3 [I.V.:525; Blood:543.3; IV Piggyback:50] Out: 230 [Urine:200; Drains:30] Total I/O In: -  Out: 230 [Urine:200; Drains:30]   Physical Exam: General- pt is awake,but confused Resp- No acute REsp distress, decreased BS at bases CVS- S1S2 regular in rate and rhythm GIT- BS+, distended, tender EXT- NoLE Edema,No Cyanosis   Lab Results: CBC  Recent Labs  12/16/15 2358 12/17/15 1027  WBC 20.6* 15.9*  HGB 6.3* 8.9*  HCT 18.7* 27.5*  PLT 262 201    BMET  Recent Labs  12/16/15 0947 12/17/15 1027  NA 138 136  K 3.5 3.6  CL 111 107  CO2 19* 19*  GLUCOSE 220* 206*  BUN 112* 128*  CREATININE 5.01* 5.92*  CALCIUM 7.8* 7.5*    Trend Creat  2017 4.0--4.1=>2.0=>5.92  ( Baseline 2.7--3.1) 2015 2.9--3.2 2014 2.2 2011 1.78 2010 1.6--2.58    MICRO Recent Results (from the past 240 hour(Orr))  C difficile quick scan w PCR reflex      Status: None   Collection Time: 12/16/15  8:38 AM  Result Value Ref Range Status   C Diff antigen NEGATIVE NEGATIVE Final   C Diff toxin NEGATIVE NEGATIVE Final   C Diff interpretation No C. difficile detected.  Final      Lab Results  Component Value Date   PTH 144.3 (H) 07/26/2013   CALCIUM 7.5 (L) 12/17/2015   PHOS 4.6 12/17/2015          Impression: 1)Renal AKI secondary to Multiple factors  Prerenal and ATN from Hypotension  Meds- pt had NSAIDS on board as outpt  AKI on CKD  CKD stage 4 .  CKD since 2010  CKD secondary to age asociated decline/HTN/ DM/ Morbid obesty/obstruction ( pt has hx of nephrolithiasis)                 AKI worsening now               Pt had expressed to family about his wishes to Not go for dialysis                             2)CVS stable   3)Anemia HGb was trending down Sec to GI perforation Orr/P transfusion Sx following for the need of transfusion  4)CKD Mineral-Bone Disorder PTH high Secondary Hyperparathyroidism present Phosphorus not at goal Calcium is at goal.  5)GI- admitted with perforation now Orr/p gastrorrhaphy. Surgery following for need of NG tube/diet.  6)Electrolytes  Hyperkalemic  Sec to PO kcl as outpt + AKI and CKD as baseline    Now better, back with normal limits  Hypernatremic   Sec to poor po intake    Now better  7)Acid base Co2 not at goal but stable   8) Social-Pt is now under comfort care  Plan:  Will continue current care. I discussed pt poor prognosis with family.  [name] Jan 02, 2016, 4:37 AM

## 2015-12-24 NOTE — Progress Notes (Signed)
Patient has been transitioned to palliative care, comfort care only. Family is comfortable with this. All questions answered.

## 2015-12-24 DEATH — deceased

## 2016-02-23 NOTE — Discharge Summary (Signed)
Physician Discharge Summary  Patient ID: Corey BertholdWalter B Sitzman MRN: [id number] DOB/AGE: [birth year] 82 y.o.  Admit date: 12/21/2015 Discharge date: 2016-01-29 Admission Diagnoses: Perforated duodenal ulcer, chronic kidney disease, morbid obesity, coronary artery disease, diabetes mellitus, hypertension, COPD  Discharge Diagnoses:  Active Problems:   Perforated ulcer of intestine (HCC)   Protein-calorie malnutrition, severe   Palliative care encounter   Goals of care, counseling/discussion   DNR (do not resuscitate) discussion   Hospital Course: Patient was an [age] year old white male multiple medical problems who presented to the emergency room with 48 hour history of worsening abdominal pain. CT scan of the abdomen revealed a perforated viscus, consistent with a perforated duodenal ulcer. He was noted earlier in 2017 on EGD to have a large antral ulcer with nodular gastritis with mild duodenitis. He was taken urgently to the operating room and underwent a gastrorrhaphy. A large duodenal ulcer was found. Postoperatively, he went to the intensive care unit. His postoperative course was remarkable for anemia as well as multiple electrolyte abnormalities. Nephrology was consulted. His previously diagnosed dementia seemed to be worse due to his postoperative critical state. On postoperative day 7, his NG tube was removed. A postoperative day 8, he developed tachycardia and mild hypotension. He was also having bloody bowel movements. He did receive multiple units of packed red blood cells. His hypernatremia and hyperglycemia were dressed. Over the next few days, he developed multiple organ failure. Palliative care was consulted and the patient was made comfort measures only on 12/17/2015. He subsequently expired on 2016-01-29.  Consults: nephrology and Palliative care  Treatments: surgery: Exploratory laparotomy, gastrorrhaphy on 11/26/2015  Discharge Exam: Expired  Disposition: 20-Expired      Medication List    TAKE these medications   albuterol 108 (90 Base) MCG/ACT inhaler Commonly known as:  PROVENTIL HFA;VENTOLIN HFA Inhale 2 puffs into the lungs every 4 (four) hours as needed for wheezing or shortness of breath.   albuterol (2.5 MG/3ML) 0.083% nebulizer solution Commonly known as:  PROVENTIL Take 3 mLs (2.5 mg total) by nebulization every 6 (six) hours as needed for wheezing or shortness of breath.   aspirin EC 81 MG tablet Take 81 mg by mouth daily.   atorvastatin 20 MG tablet Commonly known as:  LIPITOR TAKE 1 TABLET BY MOUTH AT BEDTIME FOR CHOLESTEROL.   citalopram 40 MG tablet Commonly known as:  CELEXA Take 0.5 tablets (20 mg total) by mouth daily.   furosemide 20 MG tablet Commonly known as:  LASIX Take 2 tablets (40 mg total) by mouth daily. Restart on 3/14   gabapentin 100 MG capsule Commonly known as:  NEURONTIN Take 100 mg by mouth 3 (three) times daily.   HYDROcodone-acetaminophen 7.5-325 MG tablet Commonly known as:  NORCO Take 1-2 tablets by mouth every 4 (four) hours as needed for moderate pain (Must last 30 days.  Do not drive or operate machinery while taking this medicine.). What changed:  how much to take   insulin detemir 100 UNIT/ML injection Commonly known as:  LEVEMIR Inject 0.25 mLs (25 Units total) into the skin daily.   metoprolol tartrate 25 MG tablet Commonly known as:  LOPRESSOR Take 0.5 tablets (12.5 mg total) by mouth 2 (two) times daily.   naproxen 500 MG tablet Commonly known as:  NAPROSYN Take 500 mg by mouth 2 (two) times daily with a meal.   pantoprazole 40 MG tablet Commonly known as:  PROTONIX Take 1 tablet (40 mg total) by mouth 2 (two) times daily before  a meal.   pioglitazone 45 MG tablet Commonly known as:  ACTOS Take 45 mg by mouth daily.   potassium chloride 10 MEQ CR capsule Commonly known as:  MICRO-K Take 1 capsule (10 mEq total) by mouth daily. Restart on 3/14   SPIRIVA RESPIMAT 2.5 MCG/ACT  Aers Generic drug:  Tiotropium Bromide Monohydrate Inhale 1 puff into the lungs daily.        SignedFranky Macho A 02/02/2016, 8:48 AM

## 2016-02-27 ENCOUNTER — Ambulatory Visit: Payer: Medicare Other | Admitting: Orthopaedic Surgery

## 2016-09-14 IMAGING — CT CT ABD-PELV W/O CM
2 of 4 series · 15 of 46 positions shown, 17 images · non-contrast
Comparison: 11/15/2013, 01/30/2013

CLINICAL DATA: Has not been feeling well, abdominal pain and
vomiting starting 2 days ago, history hypertension, hyperlipidemia,
type II diabetes mellitus, coronary artery disease, COPD, history
peptic ulcer disease, former smoker

EXAM:
CT ABDOMEN AND PELVIS WITHOUT CONTRAST
TECHNIQUE: Multidetector CT imaging of the abdomen and pelvis was performed
following the standard protocol without IV contrast. Sagittal and
coronal MPR images reconstructed from axial data set. Oral contrast
was not administered.

[Series 2: routine abd pel without · axial · non-contrast · 0.92mm/px · z∈[-476,+40]mm · 12 of 113 slices shown, 14 images]
[im 5/113  soft-tissue]
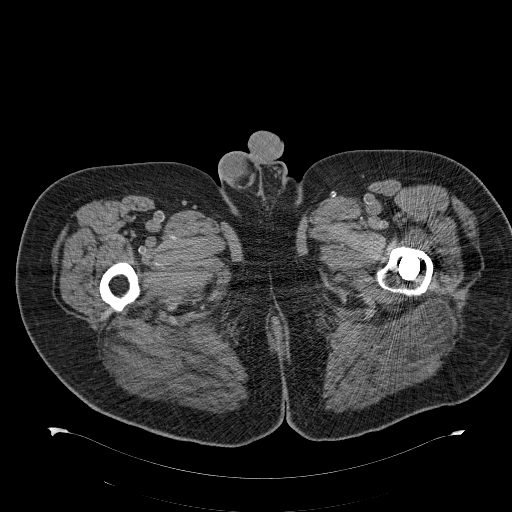
[im 5/113  bone]
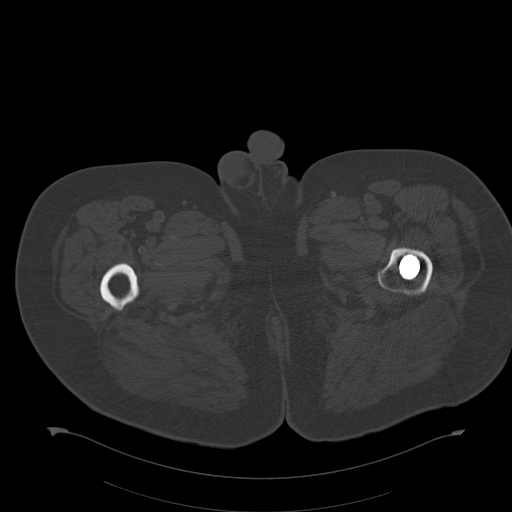
[im 15/113  soft-tissue]
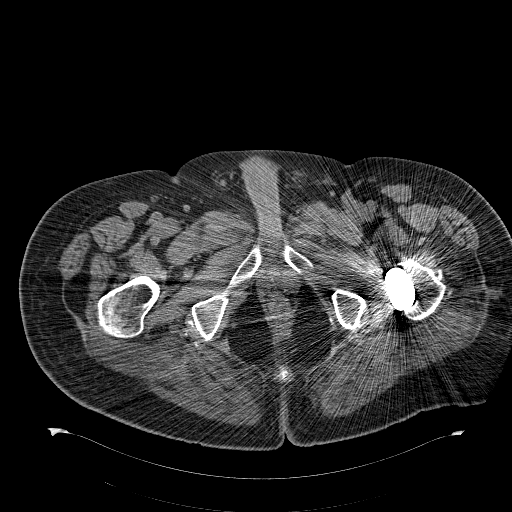
[im 24/113  soft-tissue]
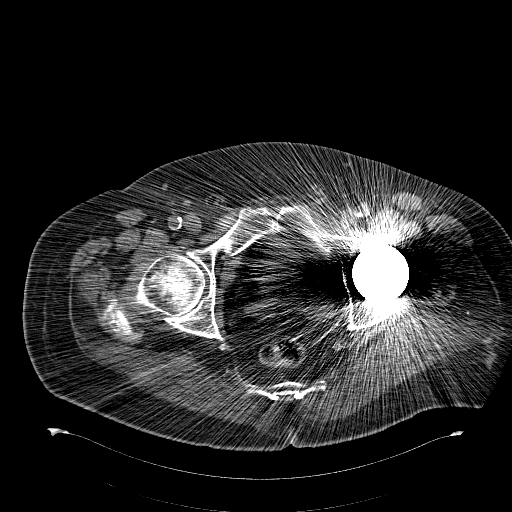
[im 33/113  soft-tissue]
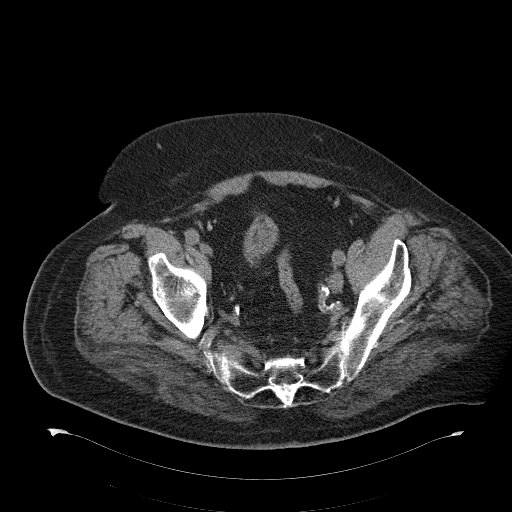
[im 43/113  soft-tissue]
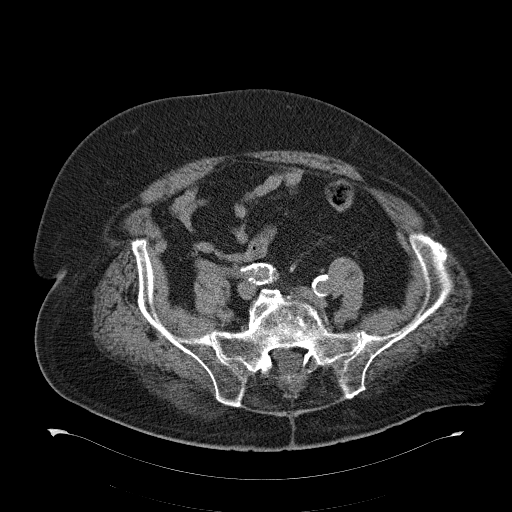
[im 52/113  soft-tissue]
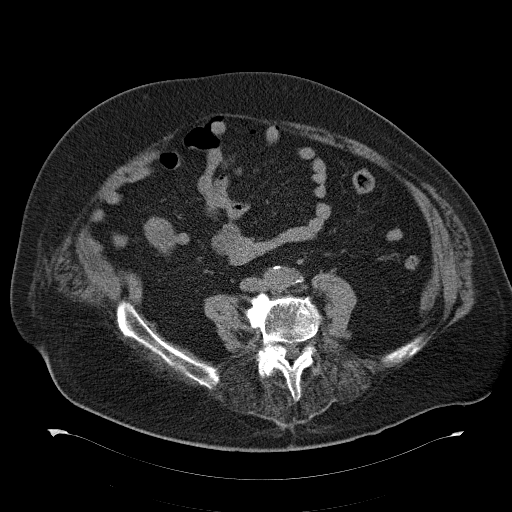
[im 61/113  soft-tissue]
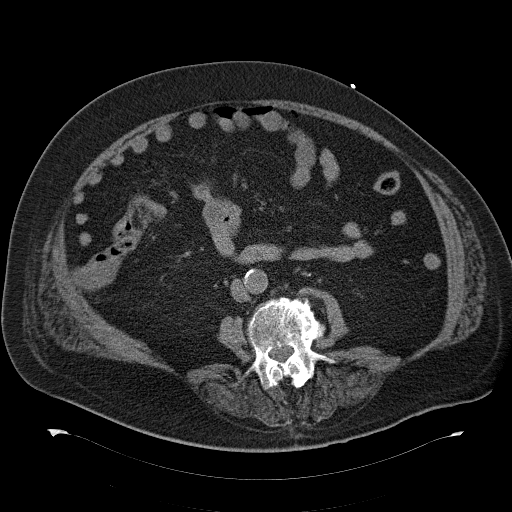
[im 71/113  soft-tissue]
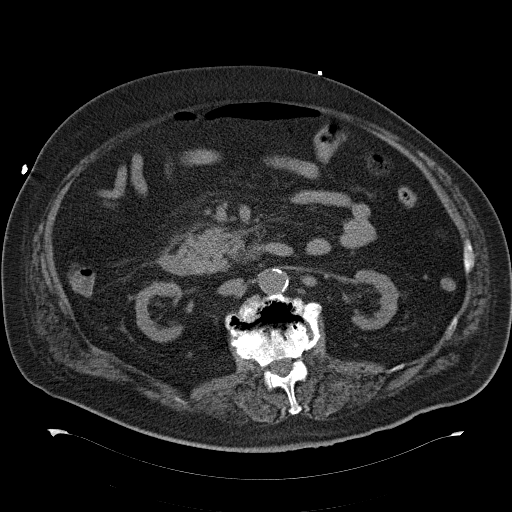
[im 80/113  soft-tissue]
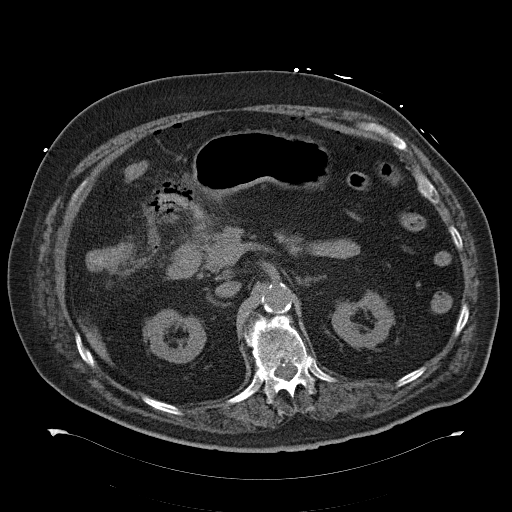
[im 80/113  bone]
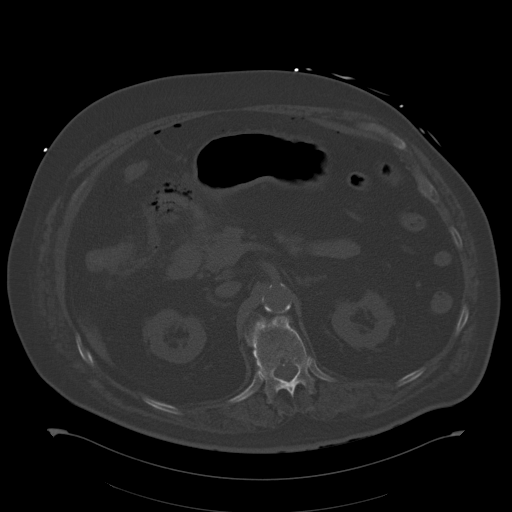
[im 89/113  soft-tissue]
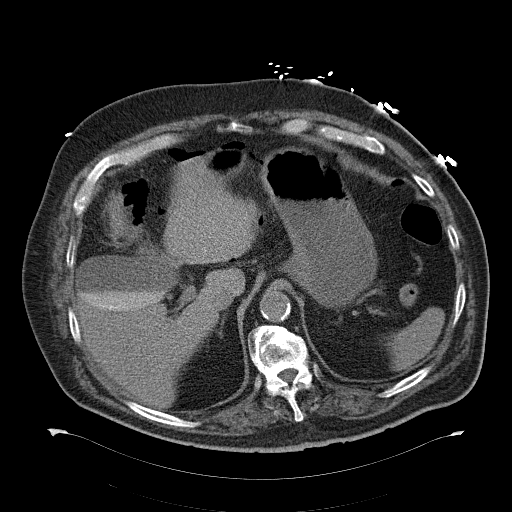
[im 99/113  soft-tissue]
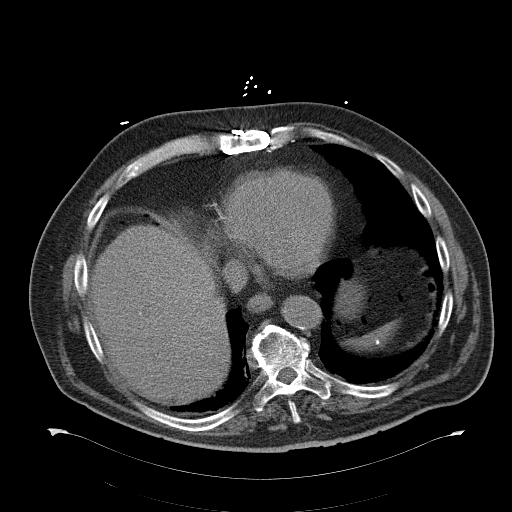
[im 108/113  soft-tissue]
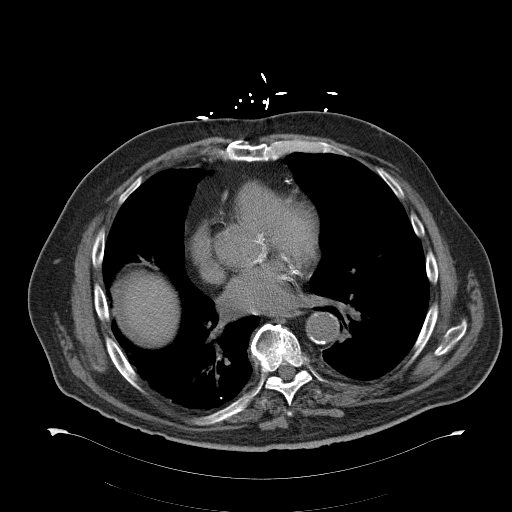

[Series 4: coronal · coronal · 0.93mm/px · 3 of 175 slices shown]
[im 59/175  soft-tissue]
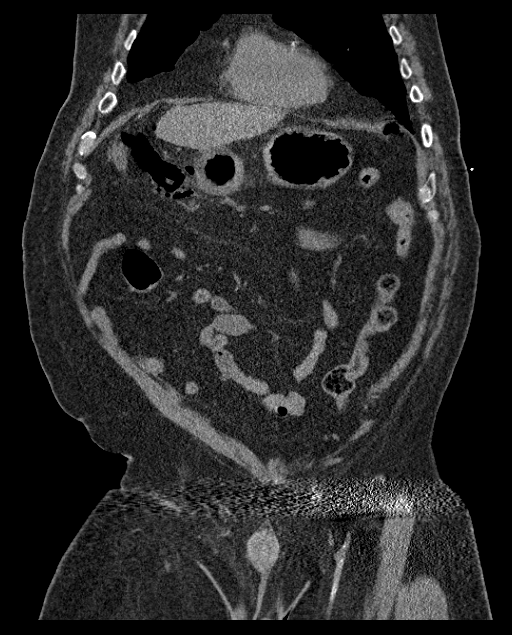
[im 78/175  soft-tissue]
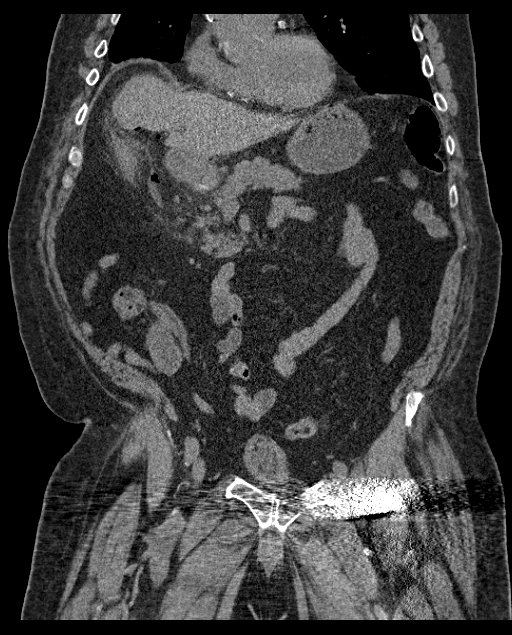
[im 97/175  soft-tissue]
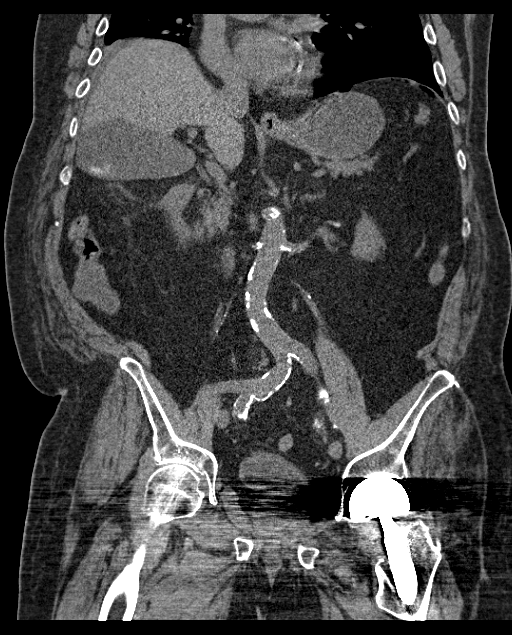

[15 of 46 positions shown; findings below may reference images not displayed]

FINDINGS: Lower chest:  Bibasilar atelectasis greater on RIGHT

Hepatobiliary: Dependent layered high attenuation material within
gallbladder unchanged from previous exams question layered tiny
gallstones versus milk of calcium. No gross gallbladder wall
thickening. Liver unremarkable. No biliary dilatation.

Pancreas: Normal appearance

Spleen: Calcified granulomata within spleen. Otherwise normal
appearance

Adrenals/Urinary Tract: Adrenal glands normal appearance. BILATERAL
renal cortical atrophy without mass or hydronephrosis. Beam
hardening artifacts in pelvis partially obscure the bladder and
distal ureters. Visualized portions of the ureters and bladder are
unremarkable.

Stomach/Bowel: Appendix surgically absent by history. Extraluminal
gas and mild infiltrative changes identified in RIGHT upper quadrant
adjacent to the hepatic flexure of the colon and in close proximity
to the proximal duodenum, which appears thickened. Free
intraperitoneal air is seen perihepatic and throughout the upper
abdomen with a few foci in the lesser sac as well. Findings
consistent with perforated viscus, favor perforated duodenal ulcer ;
alhtough the adjacent hepatic flexure of the colon does not appear
significantly thickened, colonic perforation not excluded as source.
Portion of periduodenal edema extends to near the pancreatic head
though this does not appear to represent the epicenter of the
process. Remaining bowel loops unremarkable.

Vascular/Lymphatic: Atherosclerotic calcifications aorta and iliac
arteries including origins of the celiac artery and significantly
the proximal SMA. Significant plaque also seen at the origins of the
renal arteries bilaterally. Aneurysmal dilatation of the common
iliac arteries 20 mm diameter RIGHT and 17 mm LEFT with note of a 17
mm diameter LEFT internal iliac artery. No adenopathy.

Reproductive: N/A

Other: Free air as above. No significant free fluid. No definite
hernia.

Musculoskeletal: Bones demineralized with note of a LEFT hip
prosthesis. Degenerative disc disease changes thoracolumbar spine
with S accompanying facet degenerative changes at the lumbar region.
AP spinal stenosis L3-L4 and to lesser degrees at L4-L5 and L5-S1,
multifactorial.
IMPRESSION: Free intraperitoneal air with additional extraluminal gas within fat
in the RIGHT upper quadrant adjacent to the duodenum and hepatic
flexure of colon, favor perforated duodenal ulcer due to associated
duodenal wall thickening though colonic perforation is not
completely excluded.

Aortic atherosclerosis including origins of abdominal vessels and
extending into iliac arteries as above.

Findings discussed with Dr. Trae on 12/04/2015 at 2337 hours.
